# Patient Record
Sex: Female | Born: 1940 | Race: Black or African American | Hispanic: No | Marital: Married | Smoking: Never smoker
Health system: Southern US, Community
[De-identification: ages and names within clinical notes are randomized; demographics above are authoritative.]

## PROBLEM LIST (undated history)

## (undated) DIAGNOSIS — K219 Gastro-esophageal reflux disease without esophagitis: Secondary | ICD-10-CM

## (undated) DIAGNOSIS — F32A Depression, unspecified: Secondary | ICD-10-CM

## (undated) DIAGNOSIS — M48061 Spinal stenosis, lumbar region without neurogenic claudication: Secondary | ICD-10-CM

## (undated) DIAGNOSIS — K449 Diaphragmatic hernia without obstruction or gangrene: Secondary | ICD-10-CM

## (undated) DIAGNOSIS — K3184 Gastroparesis: Secondary | ICD-10-CM

## (undated) DIAGNOSIS — N183 Chronic kidney disease, stage 3 unspecified: Secondary | ICD-10-CM

## (undated) DIAGNOSIS — F329 Major depressive disorder, single episode, unspecified: Secondary | ICD-10-CM

## (undated) DIAGNOSIS — Z789 Other specified health status: Secondary | ICD-10-CM

## (undated) DIAGNOSIS — F419 Anxiety disorder, unspecified: Secondary | ICD-10-CM

## (undated) DIAGNOSIS — I10 Essential (primary) hypertension: Secondary | ICD-10-CM

## (undated) DIAGNOSIS — E785 Hyperlipidemia, unspecified: Secondary | ICD-10-CM

## (undated) DIAGNOSIS — I639 Cerebral infarction, unspecified: Secondary | ICD-10-CM

## (undated) HISTORY — DX: Cerebral infarction, unspecified: I63.9

## (undated) HISTORY — DX: Depression, unspecified: F32.A

## (undated) HISTORY — DX: Diaphragmatic hernia without obstruction or gangrene: K44.9

## (undated) HISTORY — PX: TONSILLECTOMY: SUR1361

## (undated) HISTORY — DX: Anxiety disorder, unspecified: F41.9

## (undated) HISTORY — DX: Essential (primary) hypertension: I10

## (undated) HISTORY — DX: Major depressive disorder, single episode, unspecified: F32.9

## (undated) HISTORY — DX: Hyperlipidemia, unspecified: E78.5

## (undated) HISTORY — DX: Gastro-esophageal reflux disease without esophagitis: K21.9

## (undated) HISTORY — PX: OTHER SURGICAL HISTORY: SHX169

## (undated) HISTORY — DX: Other specified health status: Z78.9

## (undated) HISTORY — DX: Gastroparesis: K31.84

---

## 1973-01-31 HISTORY — PX: BREAST BIOPSY: SHX20

## 1973-01-31 HISTORY — PX: TUBAL LIGATION: SHX77

## 1980-02-01 HISTORY — PX: ABDOMINAL HYSTERECTOMY: SHX81

## 1980-02-01 HISTORY — PX: APPENDECTOMY: SHX54

## 1999-09-15 ENCOUNTER — Encounter: Admission: RE | Admit: 1999-09-15 | Discharge: 1999-09-15 | Payer: Self-pay | Admitting: Family Medicine

## 1999-09-15 ENCOUNTER — Encounter: Payer: Self-pay | Admitting: Family Medicine

## 1999-11-04 ENCOUNTER — Inpatient Hospital Stay (HOSPITAL_COMMUNITY): Admission: EM | Admit: 1999-11-04 | Discharge: 1999-11-05 | Payer: Self-pay | Admitting: Internal Medicine

## 1999-11-05 ENCOUNTER — Encounter: Payer: Self-pay | Admitting: Internal Medicine

## 2001-06-01 ENCOUNTER — Encounter: Payer: Self-pay | Admitting: Family Medicine

## 2001-06-01 ENCOUNTER — Ambulatory Visit (HOSPITAL_COMMUNITY): Admission: RE | Admit: 2001-06-01 | Discharge: 2001-06-01 | Payer: Self-pay | Admitting: Family Medicine

## 2001-06-12 ENCOUNTER — Encounter: Payer: Self-pay | Admitting: Family Medicine

## 2001-06-12 ENCOUNTER — Ambulatory Visit (HOSPITAL_COMMUNITY): Admission: RE | Admit: 2001-06-12 | Discharge: 2001-06-12 | Payer: Self-pay | Admitting: Family Medicine

## 2001-07-04 ENCOUNTER — Encounter (INDEPENDENT_AMBULATORY_CARE_PROVIDER_SITE_OTHER): Payer: Self-pay | Admitting: Specialist

## 2001-07-04 ENCOUNTER — Encounter: Admission: RE | Admit: 2001-07-04 | Discharge: 2001-07-04 | Payer: Self-pay

## 2002-11-06 ENCOUNTER — Ambulatory Visit (HOSPITAL_COMMUNITY): Admission: RE | Admit: 2002-11-06 | Discharge: 2002-11-06 | Payer: Self-pay | Admitting: Family Medicine

## 2004-05-16 ENCOUNTER — Emergency Department (HOSPITAL_COMMUNITY): Admission: EM | Admit: 2004-05-16 | Discharge: 2004-05-16 | Payer: Self-pay | Admitting: Emergency Medicine

## 2004-10-25 ENCOUNTER — Ambulatory Visit: Payer: Self-pay | Admitting: Family Medicine

## 2004-10-28 ENCOUNTER — Ambulatory Visit: Payer: Self-pay | Admitting: Family Medicine

## 2004-11-26 ENCOUNTER — Encounter: Admission: RE | Admit: 2004-11-26 | Discharge: 2004-11-26 | Payer: Self-pay | Admitting: Family Medicine

## 2005-03-24 ENCOUNTER — Ambulatory Visit: Payer: Self-pay | Admitting: Family Medicine

## 2005-04-13 ENCOUNTER — Ambulatory Visit: Payer: Self-pay | Admitting: Family Medicine

## 2005-04-13 ENCOUNTER — Encounter: Admission: RE | Admit: 2005-04-13 | Discharge: 2005-04-13 | Payer: Self-pay | Admitting: Family Medicine

## 2005-04-22 ENCOUNTER — Encounter: Admission: RE | Admit: 2005-04-22 | Discharge: 2005-04-22 | Payer: Self-pay | Admitting: Family Medicine

## 2005-04-25 ENCOUNTER — Ambulatory Visit: Payer: Self-pay | Admitting: Family Medicine

## 2005-04-26 ENCOUNTER — Ambulatory Visit: Payer: Self-pay | Admitting: Internal Medicine

## 2005-04-26 ENCOUNTER — Inpatient Hospital Stay (HOSPITAL_COMMUNITY): Admission: EM | Admit: 2005-04-26 | Discharge: 2005-04-29 | Payer: Self-pay | Admitting: Emergency Medicine

## 2005-04-26 ENCOUNTER — Ambulatory Visit: Payer: Self-pay | Admitting: Physical Medicine & Rehabilitation

## 2005-04-27 ENCOUNTER — Ambulatory Visit: Payer: Self-pay | Admitting: Cardiology

## 2005-05-02 ENCOUNTER — Ambulatory Visit: Payer: Self-pay | Admitting: Family Medicine

## 2005-05-02 ENCOUNTER — Emergency Department (HOSPITAL_COMMUNITY): Admission: EM | Admit: 2005-05-02 | Discharge: 2005-05-03 | Payer: Self-pay | Admitting: Emergency Medicine

## 2005-05-04 ENCOUNTER — Inpatient Hospital Stay (HOSPITAL_COMMUNITY): Admission: EM | Admit: 2005-05-04 | Discharge: 2005-05-06 | Payer: Self-pay | Admitting: Emergency Medicine

## 2005-05-04 ENCOUNTER — Ambulatory Visit: Payer: Self-pay | Admitting: Physical Medicine & Rehabilitation

## 2005-05-19 ENCOUNTER — Ambulatory Visit: Payer: Self-pay | Admitting: Family Medicine

## 2005-05-29 ENCOUNTER — Ambulatory Visit (HOSPITAL_BASED_OUTPATIENT_CLINIC_OR_DEPARTMENT_OTHER): Admission: RE | Admit: 2005-05-29 | Discharge: 2005-05-29 | Payer: Self-pay | Admitting: Family Medicine

## 2005-05-31 ENCOUNTER — Encounter: Admission: RE | Admit: 2005-05-31 | Discharge: 2005-06-20 | Payer: Self-pay | Admitting: Family Medicine

## 2005-06-07 ENCOUNTER — Ambulatory Visit: Payer: Self-pay | Admitting: Pulmonary Disease

## 2005-06-20 ENCOUNTER — Ambulatory Visit: Payer: Self-pay | Admitting: Family Medicine

## 2005-06-29 ENCOUNTER — Ambulatory Visit: Payer: Self-pay | Admitting: Internal Medicine

## 2005-07-14 ENCOUNTER — Ambulatory Visit: Payer: Self-pay | Admitting: Emergency Medicine

## 2005-07-14 ENCOUNTER — Ambulatory Visit: Payer: Self-pay | Admitting: Family Medicine

## 2005-07-28 ENCOUNTER — Ambulatory Visit: Payer: Self-pay | Admitting: Internal Medicine

## 2005-08-09 ENCOUNTER — Ambulatory Visit: Payer: Self-pay | Admitting: Family Medicine

## 2005-08-15 ENCOUNTER — Ambulatory Visit: Payer: Self-pay | Admitting: Emergency Medicine

## 2005-08-15 ENCOUNTER — Ambulatory Visit: Payer: Self-pay | Admitting: Cardiology

## 2005-08-17 ENCOUNTER — Ambulatory Visit (HOSPITAL_COMMUNITY): Admission: RE | Admit: 2005-08-17 | Discharge: 2005-08-17 | Payer: Self-pay | Admitting: Emergency Medicine

## 2005-08-30 ENCOUNTER — Ambulatory Visit: Payer: Self-pay | Admitting: Family Medicine

## 2005-08-31 ENCOUNTER — Ambulatory Visit: Payer: Self-pay | Admitting: Emergency Medicine

## 2005-09-14 ENCOUNTER — Ambulatory Visit: Payer: Self-pay | Admitting: Family Medicine

## 2005-09-20 ENCOUNTER — Ambulatory Visit: Payer: Self-pay | Admitting: Family Medicine

## 2005-10-06 ENCOUNTER — Ambulatory Visit: Payer: Self-pay | Admitting: Family Medicine

## 2005-10-18 ENCOUNTER — Ambulatory Visit: Payer: Self-pay

## 2005-10-18 ENCOUNTER — Encounter: Payer: Self-pay | Admitting: Cardiology

## 2005-11-30 ENCOUNTER — Encounter: Admission: RE | Admit: 2005-11-30 | Discharge: 2005-11-30 | Payer: Self-pay | Admitting: Family Medicine

## 2005-12-08 ENCOUNTER — Encounter: Admission: RE | Admit: 2005-12-08 | Discharge: 2005-12-08 | Payer: Self-pay | Admitting: Family Medicine

## 2005-12-08 ENCOUNTER — Ambulatory Visit: Payer: Self-pay | Admitting: Family Medicine

## 2005-12-12 ENCOUNTER — Ambulatory Visit: Payer: Self-pay | Admitting: Family Medicine

## 2005-12-12 LAB — CONVERTED CEMR LAB
Albumin: 3.8 g/dL (ref 3.5–5.2)
CO2: 28 meq/L (ref 19–32)
Creatinine, Ser: 1.1 mg/dL (ref 0.4–1.2)
Hgb A1c MFr Bld: 5.8 % (ref 4.6–6.0)
Microalb Creat Ratio: 11.6 mg/g (ref 0.0–30.0)
Sodium: 140 meq/L (ref 135–145)
Total Bilirubin: 1 mg/dL (ref 0.3–1.2)

## 2006-01-03 ENCOUNTER — Ambulatory Visit: Payer: Self-pay | Admitting: Family Medicine

## 2006-01-16 ENCOUNTER — Ambulatory Visit: Payer: Self-pay | Admitting: Gastroenterology

## 2006-01-26 ENCOUNTER — Ambulatory Visit: Payer: Self-pay | Admitting: Gastroenterology

## 2006-01-26 DIAGNOSIS — K648 Other hemorrhoids: Secondary | ICD-10-CM | POA: Insufficient documentation

## 2006-02-01 ENCOUNTER — Ambulatory Visit: Payer: Self-pay | Admitting: Family Medicine

## 2006-02-09 DIAGNOSIS — E785 Hyperlipidemia, unspecified: Secondary | ICD-10-CM | POA: Insufficient documentation

## 2006-02-09 DIAGNOSIS — I6350 Cerebral infarction due to unspecified occlusion or stenosis of unspecified cerebral artery: Secondary | ICD-10-CM | POA: Insufficient documentation

## 2006-02-09 DIAGNOSIS — F068 Other specified mental disorders due to known physiological condition: Secondary | ICD-10-CM | POA: Insufficient documentation

## 2006-02-09 DIAGNOSIS — I10 Essential (primary) hypertension: Secondary | ICD-10-CM | POA: Insufficient documentation

## 2006-02-09 DIAGNOSIS — E119 Type 2 diabetes mellitus without complications: Secondary | ICD-10-CM | POA: Insufficient documentation

## 2006-04-05 ENCOUNTER — Ambulatory Visit: Payer: Self-pay | Admitting: Family Medicine

## 2006-04-05 LAB — CONVERTED CEMR LAB
Calcium: 8.9 mg/dL (ref 8.4–10.5)
Chloride: 107 meq/L (ref 96–112)
GFR calc non Af Amer: 67 mL/min
Sodium: 142 meq/L (ref 135–145)

## 2006-05-08 ENCOUNTER — Ambulatory Visit: Payer: Self-pay | Admitting: Family Medicine

## 2006-06-05 ENCOUNTER — Ambulatory Visit: Payer: Self-pay | Admitting: Family Medicine

## 2006-06-05 LAB — CONVERTED CEMR LAB
ALT: 16 units/L (ref 0–40)
AST: 16 units/L (ref 0–37)
Albumin: 3.8 g/dL (ref 3.5–5.2)
Amylase: 75 units/L (ref 27–131)
Creatinine,U: 376.5 mg/dL
Hgb A1c MFr Bld: 6.6 % — ABNORMAL HIGH (ref 4.6–6.0)

## 2006-08-10 ENCOUNTER — Ambulatory Visit: Payer: Self-pay | Admitting: Family Medicine

## 2006-08-10 DIAGNOSIS — F411 Generalized anxiety disorder: Secondary | ICD-10-CM | POA: Insufficient documentation

## 2006-08-21 ENCOUNTER — Telehealth (INDEPENDENT_AMBULATORY_CARE_PROVIDER_SITE_OTHER): Payer: Self-pay | Admitting: *Deleted

## 2006-09-07 ENCOUNTER — Ambulatory Visit: Payer: Self-pay | Admitting: Family Medicine

## 2006-09-07 DIAGNOSIS — D239 Other benign neoplasm of skin, unspecified: Secondary | ICD-10-CM | POA: Insufficient documentation

## 2006-09-11 ENCOUNTER — Encounter: Payer: Self-pay | Admitting: Family Medicine

## 2006-09-12 ENCOUNTER — Encounter: Payer: Self-pay | Admitting: Family Medicine

## 2006-09-25 ENCOUNTER — Ambulatory Visit: Payer: Self-pay | Admitting: Family Medicine

## 2006-10-11 ENCOUNTER — Telehealth: Payer: Self-pay | Admitting: Family Medicine

## 2006-10-24 ENCOUNTER — Ambulatory Visit: Payer: Self-pay | Admitting: Gastroenterology

## 2006-10-27 ENCOUNTER — Encounter: Payer: Self-pay | Admitting: Family Medicine

## 2006-10-27 ENCOUNTER — Ambulatory Visit: Payer: Self-pay | Admitting: Gastroenterology

## 2006-10-27 DIAGNOSIS — K219 Gastro-esophageal reflux disease without esophagitis: Secondary | ICD-10-CM | POA: Insufficient documentation

## 2006-10-27 DIAGNOSIS — K449 Diaphragmatic hernia without obstruction or gangrene: Secondary | ICD-10-CM | POA: Insufficient documentation

## 2006-11-02 ENCOUNTER — Ambulatory Visit (HOSPITAL_COMMUNITY): Admission: RE | Admit: 2006-11-02 | Discharge: 2006-11-02 | Payer: Self-pay | Admitting: Gastroenterology

## 2006-11-13 ENCOUNTER — Ambulatory Visit: Payer: Self-pay | Admitting: Internal Medicine

## 2006-11-13 ENCOUNTER — Inpatient Hospital Stay (HOSPITAL_COMMUNITY): Admission: EM | Admit: 2006-11-13 | Discharge: 2006-11-15 | Payer: Self-pay | Admitting: Emergency Medicine

## 2006-11-13 ENCOUNTER — Telehealth (INDEPENDENT_AMBULATORY_CARE_PROVIDER_SITE_OTHER): Payer: Self-pay | Admitting: *Deleted

## 2006-11-13 ENCOUNTER — Ambulatory Visit: Payer: Self-pay | Admitting: Cardiovascular Disease

## 2006-11-14 ENCOUNTER — Encounter: Payer: Self-pay | Admitting: Internal Medicine

## 2006-11-28 IMAGING — CT CT HEAD W/O CM
1 of 2 series · 13 of 30 positions shown, 17 images · IV contrast (agent unspecified)
Comparison: 04/27/05.

CLINICAL DATA: Leg weakness.  History of recent right ACA infarct.
HEAD CT WITHOUT CONTRAST:
TECHNIQUE: Contiguous axial images were obtained from the base of the skull through the vertex according to standard protocol without contrast.

[Series 2: brain · axial · 0.47mm/px · z∈[+123,+250]mm · 13 of 32 slices shown, 17 images]
[im 3/32  brain]
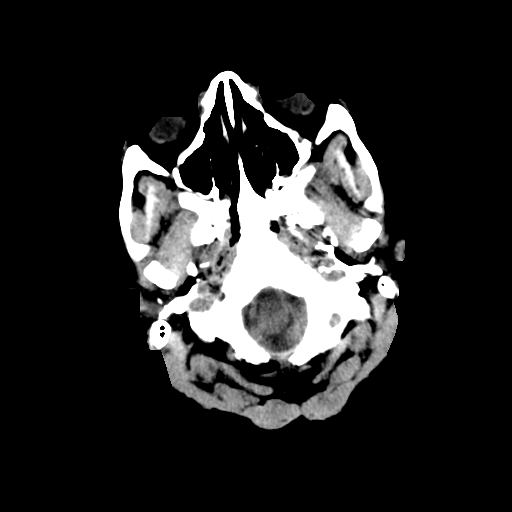
[im 3/32  bone]
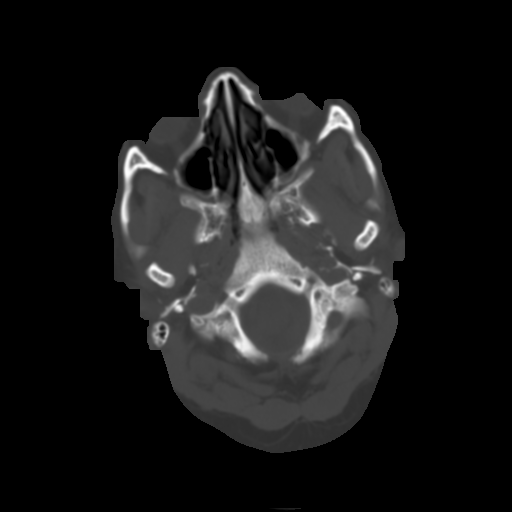
[im 5/32  brain]
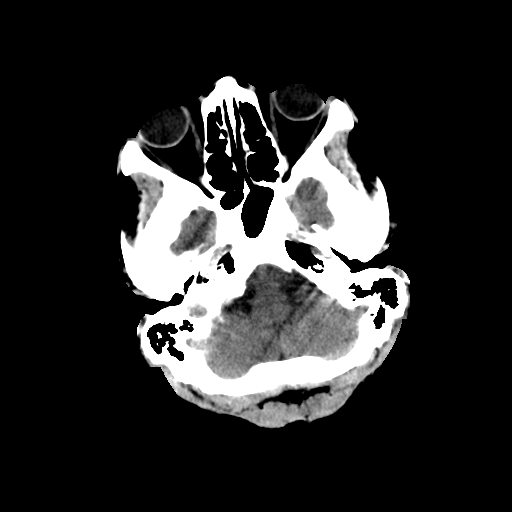
[im 7/32  brain]
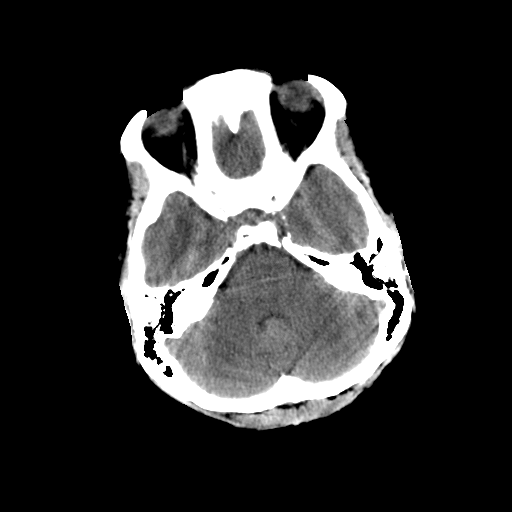
[im 9/32  brain]
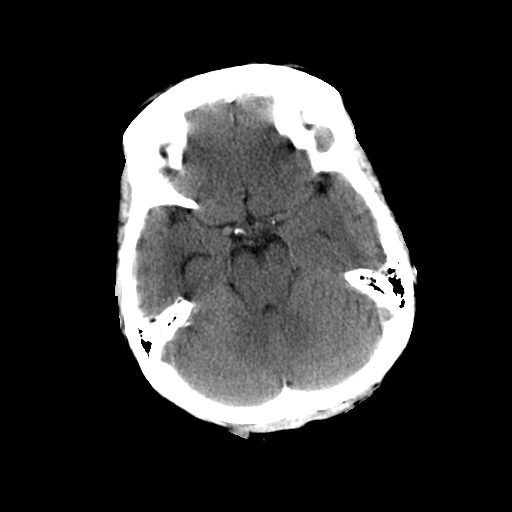
[im 12/32  brain]
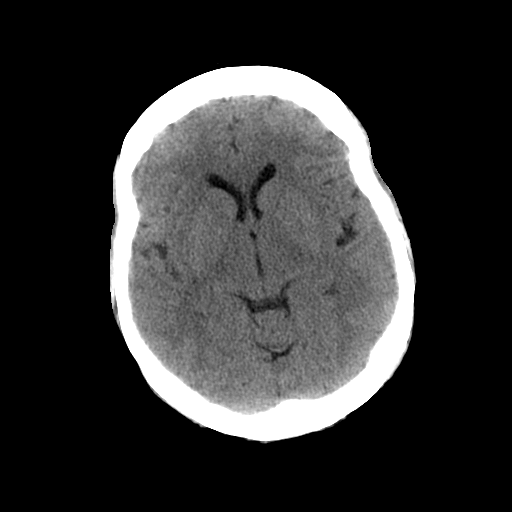
[im 12/32  bone]
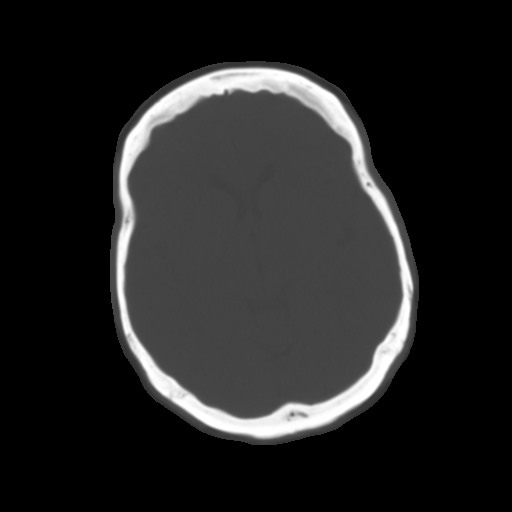
[im 14/32  brain]
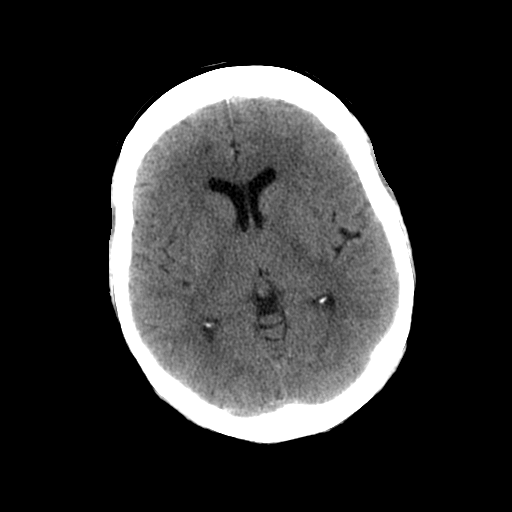
[im 16/32  brain]
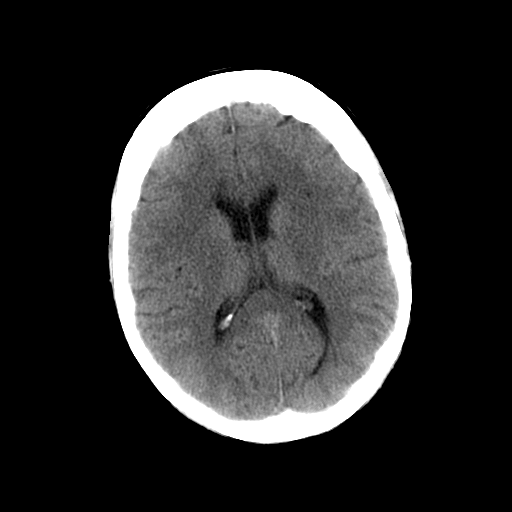
[im 18/32  brain]
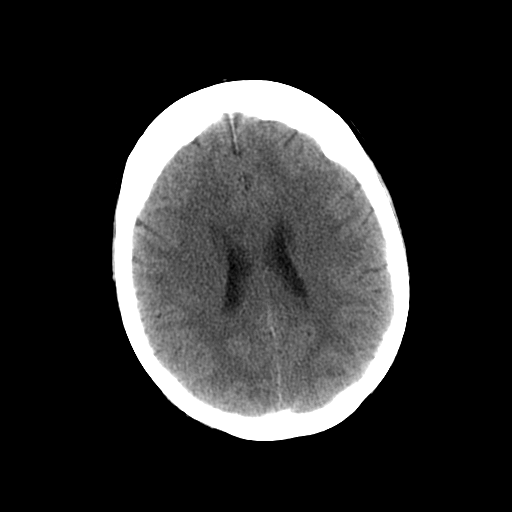
[im 20/32  brain]
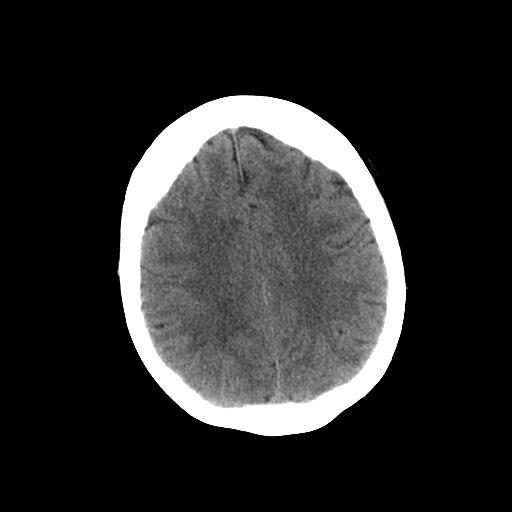
[im 20/32  bone]
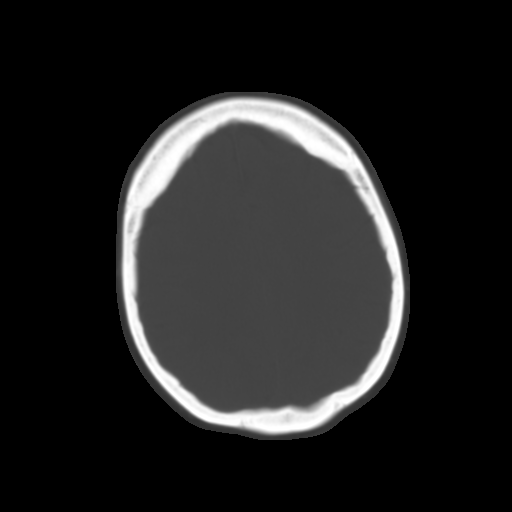
[im 23/32  brain]
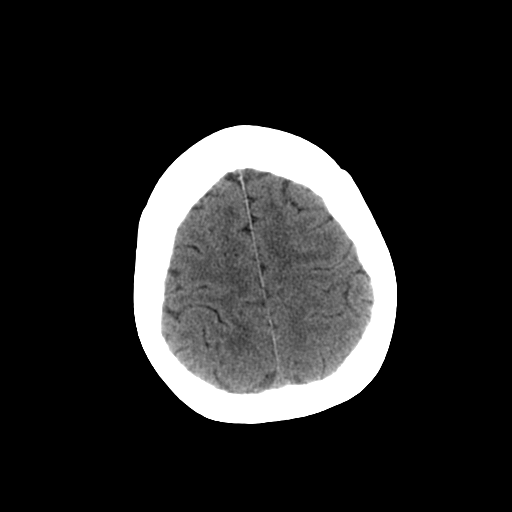
[im 25/32  brain]
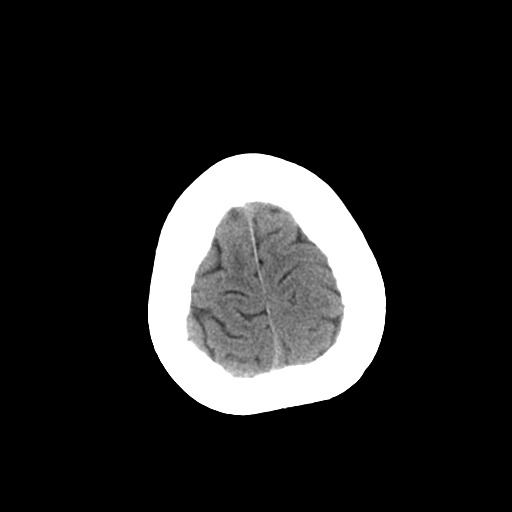
[im 27/32  brain]
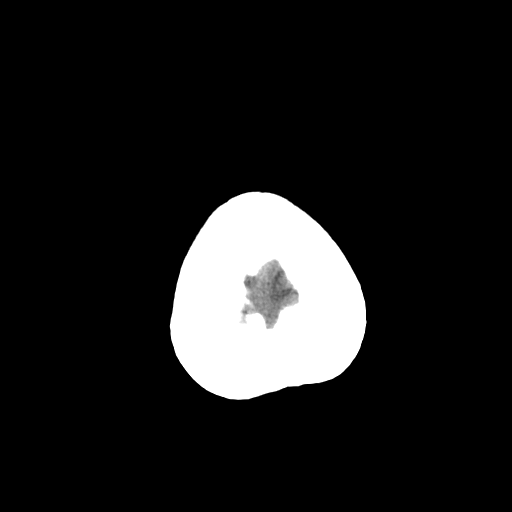
[im 29/32  brain]
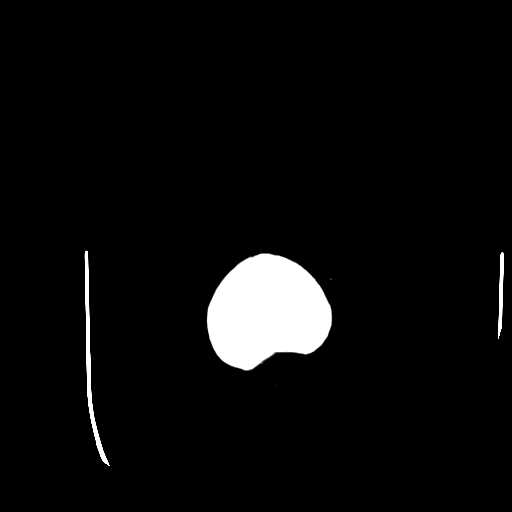
[im 29/32  bone]
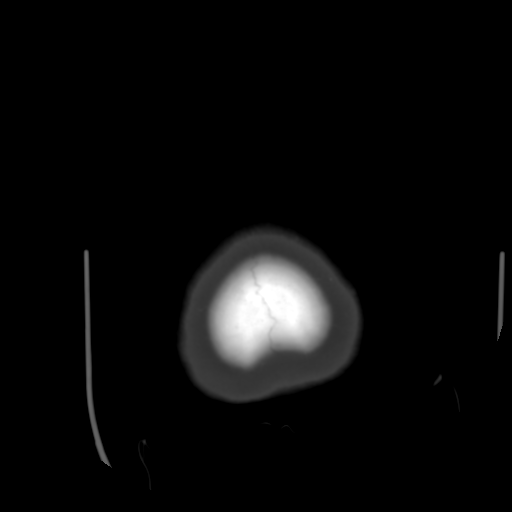

[13 of 30 positions shown; findings below may reference images not displayed]

FINDINGS: Stable white matter hypodensities are again noted.  Right ACA infarct is again vaguely visualized on this study.  No new infarct or abnormalities are identified.  There is no evidence of hemorrhage, mass effect, hydrocephalus, midline shift, or extraaxial fluid collection.  Remote left cerebellar lacune is again noted.
IMPRESSION: 1.  No evidence of acute intracranial abnormality. 
2.  Stable white matter disease and right ACA infarct.

## 2006-11-29 DIAGNOSIS — G4700 Insomnia, unspecified: Secondary | ICD-10-CM | POA: Insufficient documentation

## 2006-11-29 IMAGING — CT CT HEAD W/O CM
1 of 2 series · 13 of 30 positions shown, 17 images · IV contrast (agent unspecified)
Comparison: 05/03/05

CLINICAL DATA: Weakness and CVA in March 2005.  Increasing left-sided weakness and frontal headache.  
HEAD CT WITHOUT CONTRAST:
TECHNIQUE: Contiguous axial images were obtained from the base of the skull through the vertex according to standard protocol without contrast.

[Series 2: brain · axial · 0.47mm/px · z∈[+141,+257]mm · 13 of 28 slices shown, 17 images]
[im 2/28  brain]
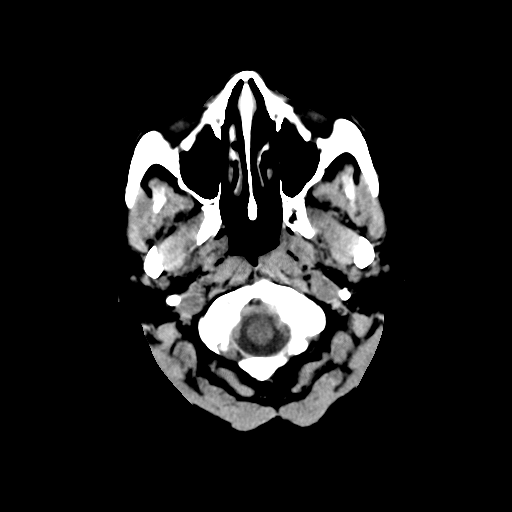
[im 2/28  bone]
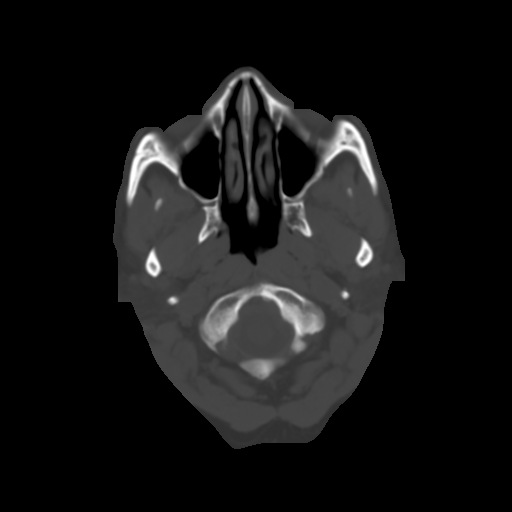
[im 4/28  brain]
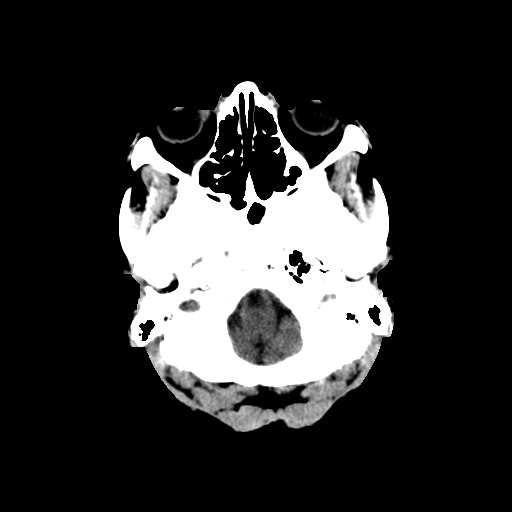
[im 6/28  brain]
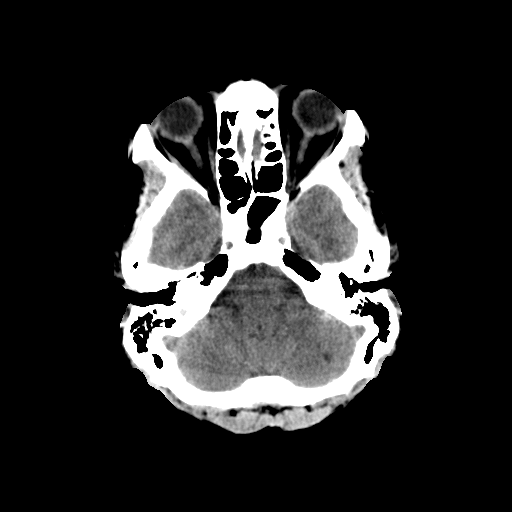
[im 8/28  brain]
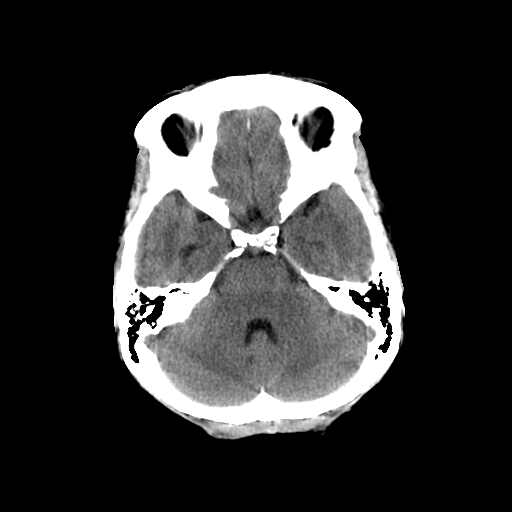
[im 10/28  brain]
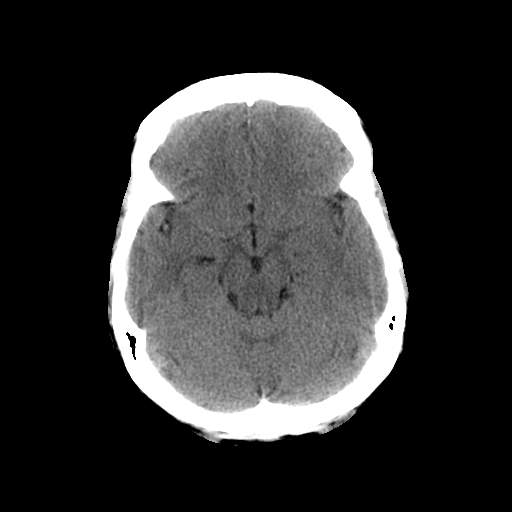
[im 10/28  bone]
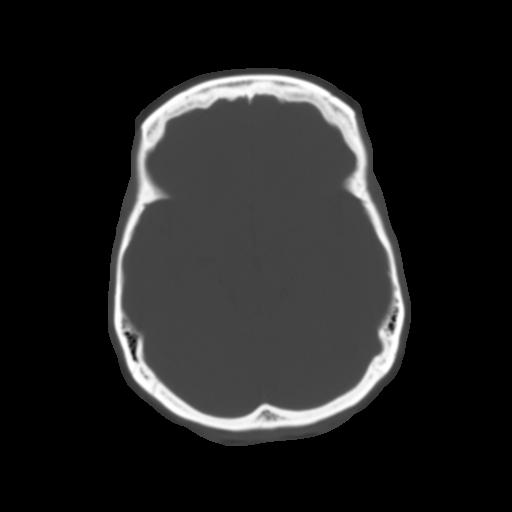
[im 12/28  brain]
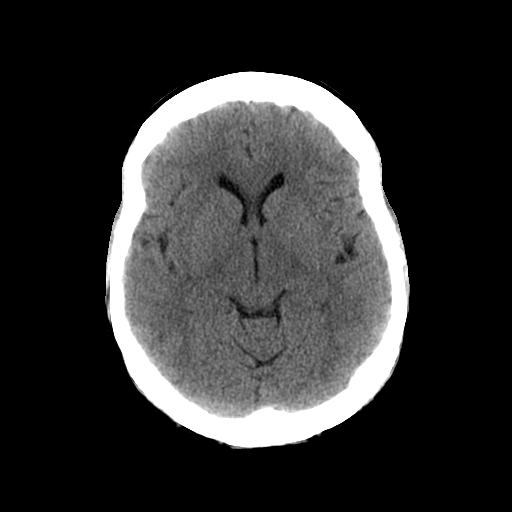
[im 14/28  brain]
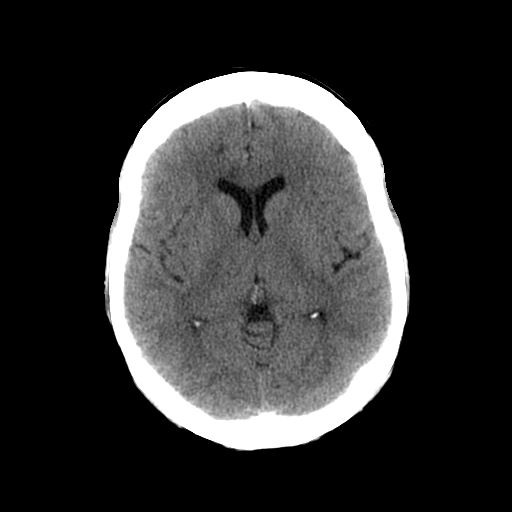
[im 16/28  brain]
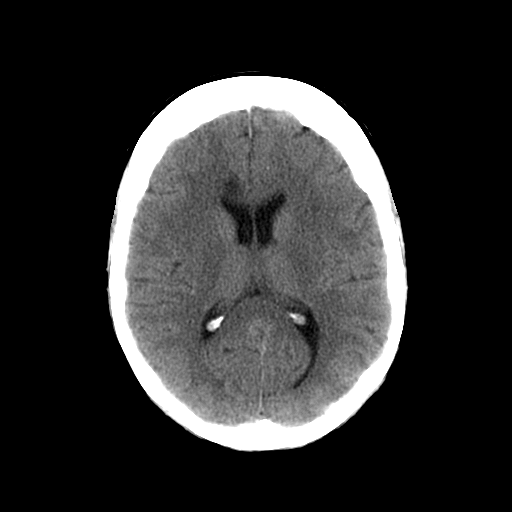
[im 18/28  brain]
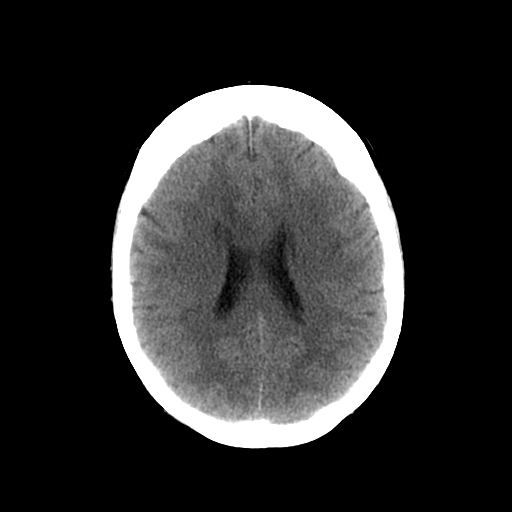
[im 18/28  bone]
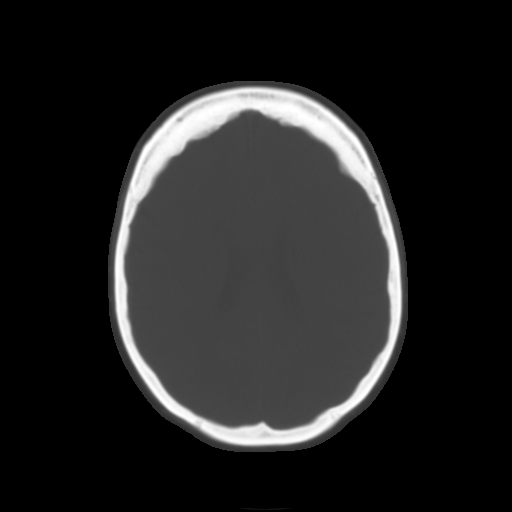
[im 20/28  brain]
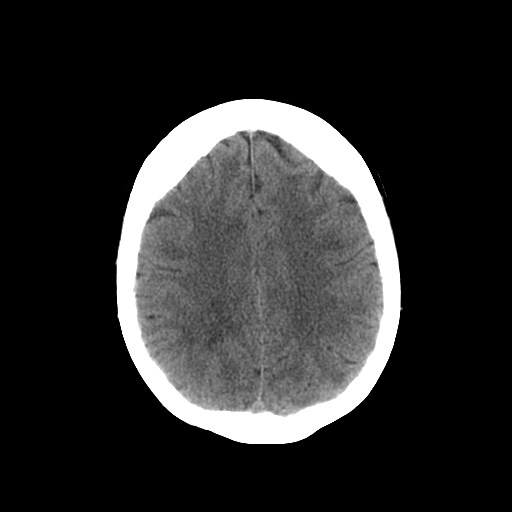
[im 22/28  brain]
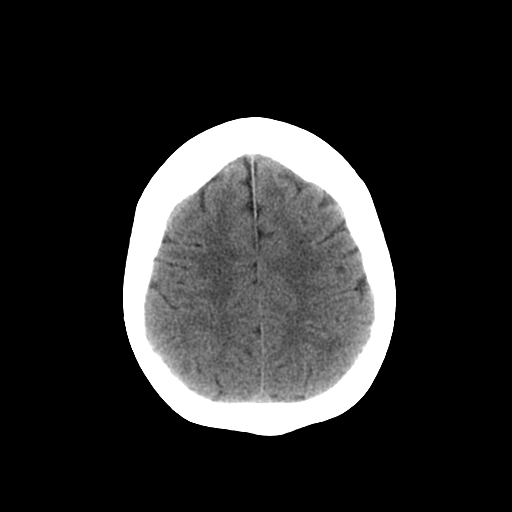
[im 24/28  brain]
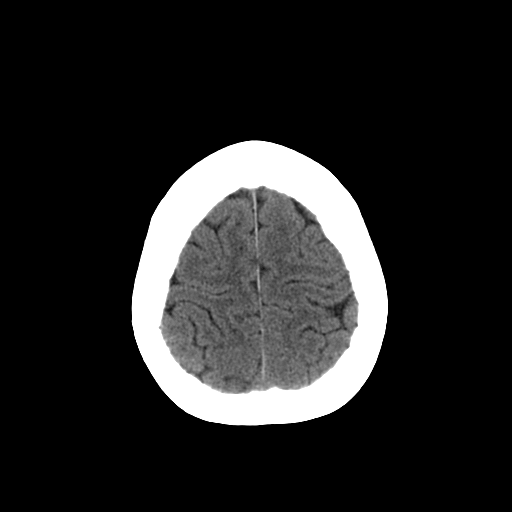
[im 26/28  brain]
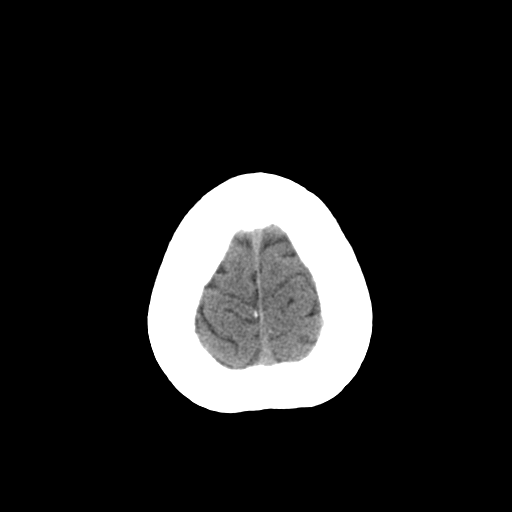
[im 26/28  bone]
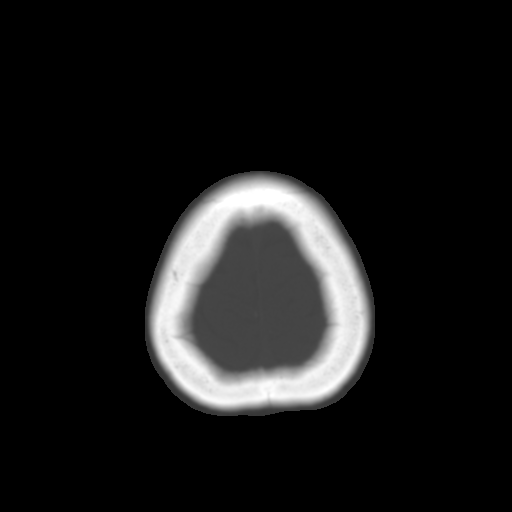

[13 of 30 positions shown; findings below may reference images not displayed]

FINDINGS: There is a new area of low attenuation adjacent to the frontal horn of the right lateral ventricle.  No evidence of hemorrhage, mass, mass effect, or hydrocephalus.  Visualized paranasal sinuses and mastoid air cells are clear.
IMPRESSION: 1.  New focus of decreased attenuation in the periventricular deep white matter of the right frontal lobe, worrisome for acute or subacute infarct.  MRI would be useful in further evaluation, as clinically indicated.

## 2006-12-06 ENCOUNTER — Ambulatory Visit: Payer: Self-pay | Admitting: Family Medicine

## 2006-12-06 DIAGNOSIS — J018 Other acute sinusitis: Secondary | ICD-10-CM | POA: Insufficient documentation

## 2006-12-07 ENCOUNTER — Encounter: Admission: RE | Admit: 2006-12-07 | Discharge: 2006-12-07 | Payer: Self-pay | Admitting: Family Medicine

## 2006-12-07 LAB — CONVERTED CEMR LAB
BUN: 11 mg/dL (ref 6–23)
CO2: 30 meq/L (ref 19–32)
Calcium: 9 mg/dL (ref 8.4–10.5)
Creatinine, Ser: 0.9 mg/dL (ref 0.4–1.2)
Hgb A1c MFr Bld: 5.5 % (ref 4.6–6.0)

## 2006-12-11 ENCOUNTER — Encounter (INDEPENDENT_AMBULATORY_CARE_PROVIDER_SITE_OTHER): Payer: Self-pay | Admitting: *Deleted

## 2006-12-19 ENCOUNTER — Telehealth (INDEPENDENT_AMBULATORY_CARE_PROVIDER_SITE_OTHER): Payer: Self-pay | Admitting: *Deleted

## 2006-12-20 ENCOUNTER — Ambulatory Visit: Payer: Self-pay | Admitting: Family Medicine

## 2006-12-20 DIAGNOSIS — F449 Dissociative and conversion disorder, unspecified: Secondary | ICD-10-CM | POA: Insufficient documentation

## 2006-12-20 DIAGNOSIS — R42 Dizziness and giddiness: Secondary | ICD-10-CM | POA: Insufficient documentation

## 2006-12-25 ENCOUNTER — Encounter: Payer: Self-pay | Admitting: Family Medicine

## 2007-02-06 ENCOUNTER — Telehealth (INDEPENDENT_AMBULATORY_CARE_PROVIDER_SITE_OTHER): Payer: Self-pay | Admitting: *Deleted

## 2007-03-05 ENCOUNTER — Ambulatory Visit: Payer: Self-pay | Admitting: Family Medicine

## 2007-03-05 LAB — CONVERTED CEMR LAB
ALT: 15 units/L (ref 0–35)
Albumin: 3.5 g/dL (ref 3.5–5.2)
Alkaline Phosphatase: 55 units/L (ref 39–117)
BUN: 13 mg/dL (ref 6–23)
CO2: 31 meq/L (ref 19–32)
Calcium: 8.8 mg/dL (ref 8.4–10.5)
Creatinine, Ser: 1 mg/dL (ref 0.4–1.2)
GFR calc Af Amer: 71 mL/min
Total Bilirubin: 1 mg/dL (ref 0.3–1.2)
Total Protein: 6.7 g/dL (ref 6.0–8.3)

## 2007-03-09 ENCOUNTER — Encounter: Payer: Self-pay | Admitting: Family Medicine

## 2007-05-02 ENCOUNTER — Ambulatory Visit: Payer: Self-pay | Admitting: Family Medicine

## 2007-05-02 DIAGNOSIS — D649 Anemia, unspecified: Secondary | ICD-10-CM | POA: Insufficient documentation

## 2007-05-09 ENCOUNTER — Telehealth (INDEPENDENT_AMBULATORY_CARE_PROVIDER_SITE_OTHER): Payer: Self-pay | Admitting: *Deleted

## 2007-05-12 LAB — CONVERTED CEMR LAB
BUN: 8 mg/dL (ref 6–23)
Basophils Absolute: 0.1 10*3/uL (ref 0.0–0.1)
Basophils Relative: 0.8 % (ref 0.0–1.0)
Eosinophils Absolute: 0.1 10*3/uL (ref 0.0–0.7)
Eosinophils Relative: 1.9 % (ref 0.0–5.0)
Ferritin: 29.6 ng/mL (ref 10.0–291.0)
HCT: 35.8 % — ABNORMAL LOW (ref 36.0–46.0)
Iron: 81 ug/dL (ref 42–145)
MCHC: 31 g/dL (ref 30.0–36.0)
MCV: 92.9 fL (ref 78.0–100.0)
Monocytes Absolute: 0.5 10*3/uL (ref 0.1–1.0)
Neutro Abs: 4.9 10*3/uL (ref 1.4–7.7)
Neutrophils Relative %: 61.9 % (ref 43.0–77.0)
RBC: 3.86 M/uL — ABNORMAL LOW (ref 3.87–5.11)
WBC: 7.8 10*3/uL (ref 4.5–10.5)

## 2007-05-13 ENCOUNTER — Encounter (INDEPENDENT_AMBULATORY_CARE_PROVIDER_SITE_OTHER): Payer: Self-pay | Admitting: *Deleted

## 2007-06-13 ENCOUNTER — Telehealth (INDEPENDENT_AMBULATORY_CARE_PROVIDER_SITE_OTHER): Payer: Self-pay | Admitting: *Deleted

## 2007-06-13 ENCOUNTER — Ambulatory Visit: Payer: Self-pay | Admitting: Internal Medicine

## 2007-06-13 DIAGNOSIS — IMO0002 Reserved for concepts with insufficient information to code with codable children: Secondary | ICD-10-CM | POA: Insufficient documentation

## 2007-06-13 DIAGNOSIS — M48061 Spinal stenosis, lumbar region without neurogenic claudication: Secondary | ICD-10-CM | POA: Insufficient documentation

## 2007-06-18 ENCOUNTER — Encounter: Payer: Self-pay | Admitting: Internal Medicine

## 2007-06-18 ENCOUNTER — Telehealth: Payer: Self-pay | Admitting: Internal Medicine

## 2007-06-18 ENCOUNTER — Telehealth (INDEPENDENT_AMBULATORY_CARE_PROVIDER_SITE_OTHER): Payer: Self-pay | Admitting: *Deleted

## 2007-07-06 ENCOUNTER — Telehealth (INDEPENDENT_AMBULATORY_CARE_PROVIDER_SITE_OTHER): Payer: Self-pay | Admitting: *Deleted

## 2007-07-11 ENCOUNTER — Ambulatory Visit: Payer: Self-pay | Admitting: Family Medicine

## 2007-07-11 DIAGNOSIS — N390 Urinary tract infection, site not specified: Secondary | ICD-10-CM | POA: Insufficient documentation

## 2007-07-11 LAB — CONVERTED CEMR LAB
Bilirubin Urine: NEGATIVE
Glucose, Urine, Semiquant: NEGATIVE
Nitrite: NEGATIVE
Specific Gravity, Urine: <1.005
pH: 8

## 2007-07-16 ENCOUNTER — Encounter (INDEPENDENT_AMBULATORY_CARE_PROVIDER_SITE_OTHER): Payer: Self-pay | Admitting: *Deleted

## 2007-07-17 ENCOUNTER — Ambulatory Visit: Payer: Self-pay | Admitting: Family Medicine

## 2007-07-18 LAB — CONVERTED CEMR LAB
Albumin: 3.9 g/dL (ref 3.5–5.2)
BUN: 10 mg/dL (ref 6–23)
Bilirubin, Direct: 0.1 mg/dL (ref 0.0–0.3)
Calcium: 8.8 mg/dL (ref 8.4–10.5)
Cholesterol: 118 mg/dL (ref 0–200)
Eosinophils Absolute: 0.1 10*3/uL (ref 0.0–0.7)
GFR calc Af Amer: 71 mL/min
GFR calc non Af Amer: 59 mL/min
Glucose, Bld: 108 mg/dL — ABNORMAL HIGH (ref 70–99)
HCT: 34 % — ABNORMAL LOW (ref 36.0–46.0)
HDL: 44.3 mg/dL (ref >39.0)
Hemoglobin: 11.2 g/dL — ABNORMAL LOW (ref 12.0–15.0)
Hgb A1c MFr Bld: 5.7 % (ref 4.6–6.0)
MCV: 94.3 fL (ref 78.0–100.0)
Microalb Creat Ratio: 2.9 mg/g (ref 0.0–30.0)
Microalb, Ur: 0.5 mg/dL (ref 0.0–1.9)
Monocytes Absolute: 0.4 10*3/uL (ref 0.1–1.0)
Monocytes Relative: 6.1 % (ref 3.0–12.0)
Neutro Abs: 4.3 10*3/uL (ref 1.4–7.7)
Platelets: 227 10*3/uL (ref 150–400)
RDW: 13.5 % (ref 11.5–14.6)
Sodium: 142 meq/L (ref 135–145)
Total CHOL/HDL Ratio: 2.7
Total Protein: 7.1 g/dL (ref 6.0–8.3)
Triglycerides: 83 mg/dL (ref 0–149)

## 2007-07-19 ENCOUNTER — Encounter (INDEPENDENT_AMBULATORY_CARE_PROVIDER_SITE_OTHER): Payer: Self-pay | Admitting: *Deleted

## 2007-08-01 ENCOUNTER — Ambulatory Visit: Payer: Self-pay | Admitting: Family Medicine

## 2007-08-06 ENCOUNTER — Telehealth (INDEPENDENT_AMBULATORY_CARE_PROVIDER_SITE_OTHER): Payer: Self-pay | Admitting: *Deleted

## 2007-08-21 ENCOUNTER — Encounter: Payer: Self-pay | Admitting: Family Medicine

## 2007-08-24 ENCOUNTER — Ambulatory Visit: Payer: Self-pay | Admitting: Family Medicine

## 2007-08-24 ENCOUNTER — Ambulatory Visit (HOSPITAL_COMMUNITY): Admission: RE | Admit: 2007-08-24 | Discharge: 2007-08-24 | Payer: Self-pay | Admitting: Family Medicine

## 2007-08-24 DIAGNOSIS — N760 Acute vaginitis: Secondary | ICD-10-CM | POA: Insufficient documentation

## 2007-08-27 ENCOUNTER — Encounter (INDEPENDENT_AMBULATORY_CARE_PROVIDER_SITE_OTHER): Payer: Self-pay | Admitting: *Deleted

## 2007-08-28 ENCOUNTER — Telehealth (INDEPENDENT_AMBULATORY_CARE_PROVIDER_SITE_OTHER): Payer: Self-pay | Admitting: *Deleted

## 2007-09-05 ENCOUNTER — Encounter: Payer: Self-pay | Admitting: Family Medicine

## 2007-09-06 ENCOUNTER — Ambulatory Visit: Payer: Self-pay | Admitting: Family Medicine

## 2007-09-13 ENCOUNTER — Telehealth (INDEPENDENT_AMBULATORY_CARE_PROVIDER_SITE_OTHER): Payer: Self-pay | Admitting: *Deleted

## 2007-09-19 ENCOUNTER — Inpatient Hospital Stay (HOSPITAL_COMMUNITY): Admission: AD | Admit: 2007-09-19 | Discharge: 2007-09-20 | Payer: Self-pay | Admitting: Neurosurgery

## 2007-09-28 ENCOUNTER — Ambulatory Visit: Payer: Self-pay | Admitting: Family Medicine

## 2007-10-03 ENCOUNTER — Encounter: Payer: Self-pay | Admitting: Internal Medicine

## 2007-10-11 ENCOUNTER — Encounter: Payer: Self-pay | Admitting: Family Medicine

## 2007-10-24 ENCOUNTER — Telehealth (INDEPENDENT_AMBULATORY_CARE_PROVIDER_SITE_OTHER): Payer: Self-pay | Admitting: *Deleted

## 2007-10-25 ENCOUNTER — Encounter: Payer: Self-pay | Admitting: Family Medicine

## 2007-11-19 ENCOUNTER — Ambulatory Visit: Payer: Self-pay | Admitting: Family Medicine

## 2007-11-20 ENCOUNTER — Encounter: Payer: Self-pay | Admitting: Family Medicine

## 2007-12-05 ENCOUNTER — Encounter: Payer: Self-pay | Admitting: Family Medicine

## 2007-12-10 ENCOUNTER — Encounter: Admission: RE | Admit: 2007-12-10 | Discharge: 2007-12-10 | Payer: Self-pay | Admitting: Family Medicine

## 2007-12-13 ENCOUNTER — Encounter: Admission: RE | Admit: 2007-12-13 | Discharge: 2007-12-13 | Payer: Self-pay | Admitting: Neurosurgery

## 2007-12-13 ENCOUNTER — Telehealth (INDEPENDENT_AMBULATORY_CARE_PROVIDER_SITE_OTHER): Payer: Self-pay | Admitting: *Deleted

## 2007-12-18 ENCOUNTER — Encounter: Payer: Self-pay | Admitting: Family Medicine

## 2007-12-26 ENCOUNTER — Encounter: Payer: Self-pay | Admitting: Family Medicine

## 2008-01-08 ENCOUNTER — Ambulatory Visit: Payer: Self-pay | Admitting: Family Medicine

## 2008-01-08 DIAGNOSIS — M25559 Pain in unspecified hip: Secondary | ICD-10-CM | POA: Insufficient documentation

## 2008-01-09 ENCOUNTER — Telehealth (INDEPENDENT_AMBULATORY_CARE_PROVIDER_SITE_OTHER): Payer: Self-pay | Admitting: *Deleted

## 2008-01-09 ENCOUNTER — Ambulatory Visit: Payer: Self-pay | Admitting: Family Medicine

## 2008-01-10 ENCOUNTER — Telehealth (INDEPENDENT_AMBULATORY_CARE_PROVIDER_SITE_OTHER): Payer: Self-pay | Admitting: *Deleted

## 2008-01-18 ENCOUNTER — Encounter: Payer: Self-pay | Admitting: Family Medicine

## 2008-01-23 ENCOUNTER — Ambulatory Visit: Payer: Self-pay | Admitting: Family Medicine

## 2008-01-24 LAB — CONVERTED CEMR LAB
AST: 18 units/L (ref 0–37)
Albumin: 3.7 g/dL (ref 3.5–5.2)
BUN: 9 mg/dL (ref 6–23)
Calcium: 9 mg/dL (ref 8.4–10.5)
Cholesterol: 121 mg/dL (ref 0–200)
Creatinine, Ser: 0.8 mg/dL (ref 0.4–1.2)
GFR calc Af Amer: 92 mL/min
GFR calc non Af Amer: 76 mL/min
Hgb A1c MFr Bld: 5.9 % (ref 4.6–6.0)
LDL Cholesterol: 57 mg/dL (ref 0–99)
Triglycerides: 90 mg/dL (ref 0–149)
VLDL: 18 mg/dL (ref 0–40)

## 2008-01-28 ENCOUNTER — Encounter (INDEPENDENT_AMBULATORY_CARE_PROVIDER_SITE_OTHER): Payer: Self-pay | Admitting: *Deleted

## 2008-02-28 ENCOUNTER — Telehealth (INDEPENDENT_AMBULATORY_CARE_PROVIDER_SITE_OTHER): Payer: Self-pay | Admitting: *Deleted

## 2008-02-28 ENCOUNTER — Encounter: Payer: Self-pay | Admitting: Family Medicine

## 2008-04-01 ENCOUNTER — Encounter: Payer: Self-pay | Admitting: Family Medicine

## 2008-04-07 ENCOUNTER — Ambulatory Visit: Payer: Self-pay | Admitting: Family Medicine

## 2008-04-07 DIAGNOSIS — IMO0001 Reserved for inherently not codable concepts without codable children: Secondary | ICD-10-CM | POA: Insufficient documentation

## 2008-04-09 ENCOUNTER — Telehealth: Payer: Self-pay | Admitting: Family Medicine

## 2008-04-17 ENCOUNTER — Telehealth (INDEPENDENT_AMBULATORY_CARE_PROVIDER_SITE_OTHER): Payer: Self-pay | Admitting: *Deleted

## 2008-04-17 ENCOUNTER — Ambulatory Visit: Payer: Self-pay | Admitting: Family Medicine

## 2008-04-20 LAB — CONVERTED CEMR LAB
Chloride: 108 meq/L (ref 96–112)
Creatinine, Ser: 0.9 mg/dL (ref 0.4–1.2)
Sodium: 144 meq/L (ref 135–145)

## 2008-04-21 ENCOUNTER — Encounter (INDEPENDENT_AMBULATORY_CARE_PROVIDER_SITE_OTHER): Payer: Self-pay | Admitting: *Deleted

## 2008-04-22 ENCOUNTER — Ambulatory Visit: Payer: Self-pay | Admitting: Family Medicine

## 2008-04-22 DIAGNOSIS — E876 Hypokalemia: Secondary | ICD-10-CM | POA: Insufficient documentation

## 2008-04-22 LAB — CONVERTED CEMR LAB
Chloride: 103 meq/L (ref 96–112)
Creatinine, Ser: 0.9 mg/dL (ref 0.4–1.2)
GFR calc non Af Amer: 66 mL/min
Potassium: 3.3 meq/L — ABNORMAL LOW (ref 3.5–5.1)
Sodium: 141 meq/L (ref 135–145)
Total CK: 184 units/L — ABNORMAL HIGH (ref 7–177)

## 2008-05-06 ENCOUNTER — Ambulatory Visit: Payer: Self-pay

## 2008-05-06 ENCOUNTER — Ambulatory Visit: Payer: Self-pay | Admitting: Family Medicine

## 2008-05-06 DIAGNOSIS — M79609 Pain in unspecified limb: Secondary | ICD-10-CM | POA: Insufficient documentation

## 2008-05-23 ENCOUNTER — Telehealth: Payer: Self-pay | Admitting: Family Medicine

## 2008-05-23 ENCOUNTER — Encounter: Payer: Self-pay | Admitting: Family Medicine

## 2008-05-26 ENCOUNTER — Ambulatory Visit: Payer: Self-pay | Admitting: Family Medicine

## 2008-06-02 ENCOUNTER — Ambulatory Visit: Payer: Self-pay | Admitting: Family Medicine

## 2008-06-02 DIAGNOSIS — F329 Major depressive disorder, single episode, unspecified: Secondary | ICD-10-CM | POA: Insufficient documentation

## 2008-06-18 ENCOUNTER — Telehealth: Payer: Self-pay | Admitting: Gastroenterology

## 2008-06-18 ENCOUNTER — Telehealth (INDEPENDENT_AMBULATORY_CARE_PROVIDER_SITE_OTHER): Payer: Self-pay | Admitting: *Deleted

## 2008-06-23 ENCOUNTER — Ambulatory Visit: Payer: Self-pay | Admitting: Internal Medicine

## 2008-06-23 DIAGNOSIS — R1319 Other dysphagia: Secondary | ICD-10-CM | POA: Insufficient documentation

## 2008-07-11 ENCOUNTER — Ambulatory Visit: Payer: Self-pay | Admitting: Family Medicine

## 2008-07-11 DIAGNOSIS — G894 Chronic pain syndrome: Secondary | ICD-10-CM | POA: Insufficient documentation

## 2008-07-11 LAB — CONVERTED CEMR LAB: Glucose, Bld: 179 mg/dL

## 2008-07-31 ENCOUNTER — Emergency Department (HOSPITAL_COMMUNITY): Admission: EM | Admit: 2008-07-31 | Discharge: 2008-08-01 | Payer: Self-pay | Admitting: Emergency Medicine

## 2008-08-19 ENCOUNTER — Telehealth (INDEPENDENT_AMBULATORY_CARE_PROVIDER_SITE_OTHER): Payer: Self-pay | Admitting: *Deleted

## 2008-08-30 ENCOUNTER — Ambulatory Visit: Payer: Self-pay | Admitting: Internal Medicine

## 2008-08-30 ENCOUNTER — Inpatient Hospital Stay (HOSPITAL_COMMUNITY): Admission: EM | Admit: 2008-08-30 | Discharge: 2008-09-01 | Payer: Self-pay | Admitting: Emergency Medicine

## 2008-08-30 ENCOUNTER — Ambulatory Visit: Payer: Self-pay | Admitting: Cardiology

## 2008-09-01 ENCOUNTER — Encounter (INDEPENDENT_AMBULATORY_CARE_PROVIDER_SITE_OTHER): Payer: Self-pay | Admitting: Internal Medicine

## 2008-10-02 ENCOUNTER — Ambulatory Visit: Payer: Self-pay | Admitting: Family Medicine

## 2008-10-08 ENCOUNTER — Telehealth: Payer: Self-pay | Admitting: Family Medicine

## 2008-10-14 ENCOUNTER — Telehealth: Payer: Self-pay | Admitting: Family Medicine

## 2008-10-20 ENCOUNTER — Encounter: Payer: Self-pay | Admitting: Family Medicine

## 2008-11-03 ENCOUNTER — Encounter: Admission: RE | Admit: 2008-11-03 | Discharge: 2008-11-03 | Payer: Self-pay | Admitting: Orthopedic Surgery

## 2008-11-04 ENCOUNTER — Telehealth (INDEPENDENT_AMBULATORY_CARE_PROVIDER_SITE_OTHER): Payer: Self-pay | Admitting: *Deleted

## 2009-01-01 ENCOUNTER — Ambulatory Visit: Payer: Self-pay | Admitting: Family Medicine

## 2009-01-05 ENCOUNTER — Ambulatory Visit: Payer: Self-pay | Admitting: Family Medicine

## 2009-01-08 ENCOUNTER — Encounter: Admission: RE | Admit: 2009-01-08 | Discharge: 2009-01-08 | Payer: Self-pay | Admitting: Family Medicine

## 2009-01-09 ENCOUNTER — Encounter (INDEPENDENT_AMBULATORY_CARE_PROVIDER_SITE_OTHER): Payer: Self-pay | Admitting: *Deleted

## 2009-01-09 ENCOUNTER — Ambulatory Visit: Payer: Self-pay | Admitting: Family Medicine

## 2009-01-12 ENCOUNTER — Telehealth (INDEPENDENT_AMBULATORY_CARE_PROVIDER_SITE_OTHER): Payer: Self-pay | Admitting: *Deleted

## 2009-01-12 LAB — CONVERTED CEMR LAB
Albumin: 3.8 g/dL (ref 3.5–5.2)
Basophils Absolute: 0.1 10*3/uL (ref 0.0–0.1)
CO2: 30 meq/L (ref 19–32)
Calcium: 9.1 mg/dL (ref 8.4–10.5)
Creatinine,U: 147.1 mg/dL
Eosinophils Absolute: 0.2 10*3/uL (ref 0.0–0.7)
Glucose, Bld: 194 mg/dL — ABNORMAL HIGH (ref 70–99)
HCT: 32.5 % — ABNORMAL LOW (ref 36.0–46.0)
HDL: 45 mg/dL (ref >39.00)
Hemoglobin: 10.8 g/dL — ABNORMAL LOW (ref 12.0–15.0)
Hgb A1c MFr Bld: 6.1 % (ref 4.6–6.5)
Lymphs Abs: 3.1 10*3/uL (ref 0.7–4.0)
MCHC: 33 g/dL (ref 30.0–36.0)
Microalb Creat Ratio: 3.4 mg/g (ref 0.0–30.0)
Monocytes Absolute: 0.5 10*3/uL (ref 0.1–1.0)
Neutro Abs: 4.9 10*3/uL (ref 1.4–7.7)
Potassium: 3.4 meq/L — ABNORMAL LOW (ref 3.5–5.1)
RDW: 12.9 % (ref 11.5–14.6)
Sodium: 141 meq/L (ref 135–145)
Vit D, 25-Hydroxy: 12 ng/mL — ABNORMAL LOW (ref 30–89)

## 2009-01-13 ENCOUNTER — Encounter (INDEPENDENT_AMBULATORY_CARE_PROVIDER_SITE_OTHER): Payer: Self-pay | Admitting: *Deleted

## 2009-01-22 ENCOUNTER — Encounter: Admission: RE | Admit: 2009-01-22 | Discharge: 2009-01-22 | Payer: Self-pay | Admitting: Family Medicine

## 2009-01-30 ENCOUNTER — Encounter: Payer: Self-pay | Admitting: Family Medicine

## 2009-02-03 ENCOUNTER — Telehealth: Payer: Self-pay | Admitting: Family Medicine

## 2009-02-12 ENCOUNTER — Encounter: Payer: Self-pay | Admitting: Family Medicine

## 2009-02-17 ENCOUNTER — Telehealth (INDEPENDENT_AMBULATORY_CARE_PROVIDER_SITE_OTHER): Payer: Self-pay | Admitting: *Deleted

## 2009-02-20 ENCOUNTER — Ambulatory Visit: Payer: Self-pay | Admitting: Family Medicine

## 2009-02-20 DIAGNOSIS — R220 Localized swelling, mass and lump, head: Secondary | ICD-10-CM | POA: Insufficient documentation

## 2009-02-20 DIAGNOSIS — R221 Localized swelling, mass and lump, neck: Secondary | ICD-10-CM

## 2009-02-24 ENCOUNTER — Ambulatory Visit: Payer: Self-pay | Admitting: Family Medicine

## 2009-02-26 ENCOUNTER — Encounter: Admission: RE | Admit: 2009-02-26 | Discharge: 2009-02-26 | Payer: Self-pay | Admitting: Family Medicine

## 2009-03-05 ENCOUNTER — Telehealth (INDEPENDENT_AMBULATORY_CARE_PROVIDER_SITE_OTHER): Payer: Self-pay | Admitting: *Deleted

## 2009-03-05 LAB — CONVERTED CEMR LAB
Albumin: 3.7 g/dL (ref 3.5–5.2)
Alkaline Phosphatase: 56 units/L (ref 39–117)
Basophils Absolute: 0.1 10*3/uL (ref 0.0–0.1)
CO2: 30 meq/L (ref 19–32)
Calcium: 8.7 mg/dL (ref 8.4–10.5)
Creatinine, Ser: 1.1 mg/dL (ref 0.4–1.2)
Eosinophils Absolute: 0.3 10*3/uL (ref 0.0–0.7)
Free T4: 0.6 ng/dL (ref 0.6–1.6)
Glucose, Bld: 221 mg/dL — ABNORMAL HIGH (ref 70–99)
Hemoglobin: 11 g/dL — ABNORMAL LOW (ref 12.0–15.0)
Lymphocytes Relative: 33.8 % (ref 12.0–46.0)
MCHC: 31.9 g/dL (ref 30.0–36.0)
MCV: 93 fL (ref 78.0–100.0)
Monocytes Absolute: 0.5 10*3/uL (ref 0.1–1.0)
Neutro Abs: 4.7 10*3/uL (ref 1.4–7.7)
RDW: 12.7 % (ref 11.5–14.6)
T3, Free: 2.6 pg/mL (ref 2.3–4.2)
TSH: 2.1 microintl units/mL (ref 0.35–5.50)

## 2009-03-09 ENCOUNTER — Telehealth: Payer: Self-pay | Admitting: Family Medicine

## 2009-03-11 LAB — HM DIABETES EYE EXAM

## 2009-03-12 ENCOUNTER — Telehealth: Payer: Self-pay | Admitting: Family Medicine

## 2009-03-26 ENCOUNTER — Ambulatory Visit: Payer: Self-pay | Admitting: Family Medicine

## 2009-03-26 DIAGNOSIS — Z9181 History of falling: Secondary | ICD-10-CM | POA: Insufficient documentation

## 2009-03-27 ENCOUNTER — Encounter: Admission: RE | Admit: 2009-03-27 | Discharge: 2009-03-27 | Payer: Self-pay | Admitting: Family Medicine

## 2009-03-30 ENCOUNTER — Encounter: Payer: Self-pay | Admitting: Family Medicine

## 2009-04-09 LAB — CONVERTED CEMR LAB
BUN: 16 mg/dL (ref 6–23)
Basophils Absolute: 0.1 10*3/uL (ref 0.0–0.1)
Basophils Relative: 0.9 % (ref 0.0–3.0)
Calcium: 9.1 mg/dL (ref 8.4–10.5)
Creatinine, Ser: 1.1 mg/dL (ref 0.4–1.2)
Eosinophils Absolute: 0.2 10*3/uL (ref 0.0–0.7)
HCT: 35.3 % — ABNORMAL LOW (ref 36.0–46.0)
Hemoglobin: 11.5 g/dL — ABNORMAL LOW (ref 12.0–15.0)
Lymphs Abs: 2.3 10*3/uL (ref 0.7–4.0)
MCHC: 32.7 g/dL (ref 30.0–36.0)
MCV: 93 fL (ref 78.0–100.0)
Monocytes Absolute: 0.4 10*3/uL (ref 0.1–1.0)
Neutro Abs: 3.9 10*3/uL (ref 1.4–7.7)
RBC: 3.79 M/uL — ABNORMAL LOW (ref 3.87–5.11)
RDW: 12.9 % (ref 11.5–14.6)
Transferrin: 237 mg/dL (ref 212.0–360.0)
Vitamin B-12: 459 pg/mL (ref 211–911)

## 2009-04-14 ENCOUNTER — Ambulatory Visit: Payer: Self-pay | Admitting: Family Medicine

## 2009-04-27 LAB — CONVERTED CEMR LAB
ALT: 14 units/L (ref 0–35)
AST: 16 units/L (ref 0–37)
Albumin: 3.7 g/dL (ref 3.5–5.2)
Alkaline Phosphatase: 63 units/L (ref 39–117)
BUN: 9 mg/dL (ref 6–23)
Basophils Absolute: 0 10*3/uL (ref 0.0–0.1)
Basophils Relative: 0.2 % (ref 0.0–3.0)
Bilirubin, Direct: 0.1 mg/dL (ref 0.0–0.3)
CO2: 33 meq/L — ABNORMAL HIGH (ref 19–32)
Calcium: 8.8 mg/dL (ref 8.4–10.5)
Chloride: 109 meq/L (ref 96–112)
Cholesterol: 133 mg/dL (ref 0–200)
Creatinine, Ser: 1 mg/dL (ref 0.4–1.2)
Eosinophils Absolute: 0.2 10*3/uL (ref 0.0–0.7)
Eosinophils Relative: 2.3 % (ref 0.0–5.0)
Ferritin: 39.6 ng/mL (ref 10.0–291.0)
GFR calc non Af Amer: 70.59 mL/min (ref >60)
Glucose, Bld: 87 mg/dL (ref 70–99)
HCT: 34.4 % — ABNORMAL LOW (ref 36.0–46.0)
HDL: 52.4 mg/dL (ref >39.00)
Hemoglobin: 11.1 g/dL — ABNORMAL LOW (ref 12.0–15.0)
Hgb A1c MFr Bld: 6.5 % (ref 4.6–6.5)
Iron: 60 ug/dL (ref 42–145)
LDL Cholesterol: 62 mg/dL (ref 0–99)
Lymphocytes Relative: 47.2 % — ABNORMAL HIGH (ref 12.0–46.0)
Lymphs Abs: 3.9 10*3/uL (ref 0.7–4.0)
MCHC: 32.2 g/dL (ref 30.0–36.0)
MCV: 94.2 fL (ref 78.0–100.0)
Monocytes Absolute: 0.6 10*3/uL (ref 0.1–1.0)
Monocytes Relative: 7.8 % (ref 3.0–12.0)
Neutro Abs: 3.5 10*3/uL (ref 1.4–7.7)
Neutrophils Relative %: 42.5 % — ABNORMAL LOW (ref 43.0–77.0)
Platelets: 216 10*3/uL (ref 150.0–400.0)
Potassium: 3.6 meq/L (ref 3.5–5.1)
RBC: 3.65 M/uL — ABNORMAL LOW (ref 3.87–5.11)
RDW: 13.1 % (ref 11.5–14.6)
Saturation Ratios: 18.5 % — ABNORMAL LOW (ref 20.0–50.0)
Sodium: 145 meq/L (ref 135–145)
Total Bilirubin: 0.5 mg/dL (ref 0.3–1.2)
Total CHOL/HDL Ratio: 3
Total Protein: 7.2 g/dL (ref 6.0–8.3)
Transferrin: 231.2 mg/dL (ref 212.0–360.0)
Triglycerides: 92 mg/dL (ref 0.0–149.0)
VLDL: 18.4 mg/dL (ref 0.0–40.0)
WBC: 8.2 10*3/uL (ref 4.5–10.5)

## 2009-05-08 ENCOUNTER — Telehealth: Payer: Self-pay | Admitting: Family Medicine

## 2009-05-11 ENCOUNTER — Ambulatory Visit: Payer: Self-pay | Admitting: Family Medicine

## 2009-05-11 LAB — HM DIABETES FOOT EXAM

## 2009-05-29 ENCOUNTER — Telehealth (INDEPENDENT_AMBULATORY_CARE_PROVIDER_SITE_OTHER): Payer: Self-pay | Admitting: *Deleted

## 2009-05-29 ENCOUNTER — Encounter: Payer: Self-pay | Admitting: Family Medicine

## 2009-06-02 ENCOUNTER — Telehealth: Payer: Self-pay | Admitting: Family Medicine

## 2009-06-05 ENCOUNTER — Emergency Department (HOSPITAL_COMMUNITY): Admission: EM | Admit: 2009-06-05 | Discharge: 2009-06-05 | Payer: Self-pay | Admitting: Emergency Medicine

## 2009-06-05 ENCOUNTER — Telehealth: Payer: Self-pay | Admitting: Family Medicine

## 2009-06-08 ENCOUNTER — Ambulatory Visit: Payer: Self-pay | Admitting: Family Medicine

## 2009-06-08 DIAGNOSIS — R5383 Other fatigue: Secondary | ICD-10-CM

## 2009-06-08 DIAGNOSIS — R5381 Other malaise: Secondary | ICD-10-CM | POA: Insufficient documentation

## 2009-06-09 ENCOUNTER — Telehealth: Payer: Self-pay | Admitting: Family Medicine

## 2009-06-11 ENCOUNTER — Telehealth: Payer: Self-pay | Admitting: Family Medicine

## 2009-06-15 ENCOUNTER — Telehealth (INDEPENDENT_AMBULATORY_CARE_PROVIDER_SITE_OTHER): Payer: Self-pay | Admitting: *Deleted

## 2009-06-16 ENCOUNTER — Encounter: Payer: Self-pay | Admitting: Family Medicine

## 2009-06-16 ENCOUNTER — Telehealth (INDEPENDENT_AMBULATORY_CARE_PROVIDER_SITE_OTHER): Payer: Self-pay | Admitting: *Deleted

## 2009-06-22 ENCOUNTER — Ambulatory Visit: Payer: Self-pay | Admitting: Family Medicine

## 2009-06-22 LAB — CONVERTED CEMR LAB
Bilirubin Urine: NEGATIVE
Blood in Urine, dipstick: NEGATIVE
Ketones, urine, test strip: NEGATIVE
Nitrite: NEGATIVE
Protein, U semiquant: NEGATIVE
Urobilinogen, UA: 0.2
Whiff Test: POSITIVE

## 2009-06-23 ENCOUNTER — Encounter: Payer: Self-pay | Admitting: Family Medicine

## 2009-06-23 LAB — CONVERTED CEMR LAB
Basophils Absolute: 0 10*3/uL (ref 0.0–0.1)
Eosinophils Absolute: 0.1 10*3/uL (ref 0.0–0.7)
Folate: >20 ng/mL
Hemoglobin: 11.7 g/dL — ABNORMAL LOW (ref 12.0–15.0)
Iron: 74 ug/dL (ref 42–145)
Lymphocytes Relative: 40.8 % (ref 12.0–46.0)
MCHC: 33.1 g/dL (ref 30.0–36.0)
Neutro Abs: 3.7 10*3/uL (ref 1.4–7.7)
Platelets: 219 10*3/uL (ref 150.0–400.0)
RDW: 13.4 % (ref 11.5–14.6)
Saturation Ratios: 24 % (ref 20.0–50.0)
Transferrin: 219.8 mg/dL (ref 212.0–360.0)
Vitamin B-12: 488 pg/mL (ref 211–911)

## 2009-06-30 ENCOUNTER — Ambulatory Visit: Payer: Self-pay | Admitting: Gastroenterology

## 2009-06-30 DIAGNOSIS — K3184 Gastroparesis: Secondary | ICD-10-CM | POA: Insufficient documentation

## 2009-07-06 ENCOUNTER — Encounter: Admission: RE | Admit: 2009-07-06 | Discharge: 2009-07-06 | Payer: Self-pay | Admitting: Family Medicine

## 2009-07-09 ENCOUNTER — Ambulatory Visit: Payer: Self-pay | Admitting: Family Medicine

## 2009-07-09 LAB — CONVERTED CEMR LAB
Glucose, Urine, Semiquant: NEGATIVE
Ketones, urine, test strip: NEGATIVE
Nitrite: NEGATIVE
Specific Gravity, Urine: 1.02
pH: 6.5

## 2009-07-13 ENCOUNTER — Telehealth (INDEPENDENT_AMBULATORY_CARE_PROVIDER_SITE_OTHER): Payer: Self-pay | Admitting: *Deleted

## 2009-07-17 ENCOUNTER — Encounter (INDEPENDENT_AMBULATORY_CARE_PROVIDER_SITE_OTHER): Payer: Self-pay | Admitting: *Deleted

## 2009-07-22 ENCOUNTER — Telehealth: Payer: Self-pay | Admitting: Family Medicine

## 2009-07-27 ENCOUNTER — Encounter: Payer: Self-pay | Admitting: Family Medicine

## 2009-08-14 ENCOUNTER — Ambulatory Visit: Payer: Self-pay | Admitting: Family Medicine

## 2009-08-14 DIAGNOSIS — N898 Other specified noninflammatory disorders of vagina: Secondary | ICD-10-CM | POA: Insufficient documentation

## 2009-08-15 ENCOUNTER — Encounter: Payer: Self-pay | Admitting: Family Medicine

## 2009-08-17 LAB — CONVERTED CEMR LAB: GC Probe Amp, Urine: NEGATIVE

## 2009-08-25 ENCOUNTER — Telehealth (INDEPENDENT_AMBULATORY_CARE_PROVIDER_SITE_OTHER): Payer: Self-pay | Admitting: *Deleted

## 2009-09-07 ENCOUNTER — Encounter: Payer: Self-pay | Admitting: Family Medicine

## 2009-10-20 ENCOUNTER — Ambulatory Visit: Payer: Self-pay | Admitting: Family Medicine

## 2009-10-20 DIAGNOSIS — B354 Tinea corporis: Secondary | ICD-10-CM | POA: Insufficient documentation

## 2009-10-22 ENCOUNTER — Telehealth: Payer: Self-pay | Admitting: Gastroenterology

## 2009-10-23 ENCOUNTER — Encounter: Payer: Self-pay | Admitting: Family Medicine

## 2009-10-26 ENCOUNTER — Ambulatory Visit: Payer: Self-pay | Admitting: Gastroenterology

## 2009-10-26 ENCOUNTER — Telehealth: Payer: Self-pay | Admitting: Gastroenterology

## 2009-11-16 ENCOUNTER — Encounter: Payer: Self-pay | Admitting: Family Medicine

## 2009-12-01 ENCOUNTER — Telehealth: Payer: Self-pay | Admitting: Family Medicine

## 2010-01-07 ENCOUNTER — Encounter: Payer: Self-pay | Admitting: Family Medicine

## 2010-01-11 ENCOUNTER — Encounter
Admission: RE | Admit: 2010-01-11 | Discharge: 2010-01-11 | Payer: Self-pay | Source: Home / Self Care | Attending: Family Medicine | Admitting: Family Medicine

## 2010-01-11 LAB — HM MAMMOGRAPHY

## 2010-01-12 ENCOUNTER — Encounter: Payer: Self-pay | Admitting: Gastroenterology

## 2010-01-12 ENCOUNTER — Telehealth: Payer: Self-pay | Admitting: Family Medicine

## 2010-01-13 ENCOUNTER — Encounter: Payer: Self-pay | Admitting: Family Medicine

## 2010-02-15 ENCOUNTER — Ambulatory Visit
Admission: RE | Admit: 2010-02-15 | Discharge: 2010-02-15 | Payer: Self-pay | Source: Home / Self Care | Attending: Family Medicine | Admitting: Family Medicine

## 2010-02-15 DIAGNOSIS — Z780 Asymptomatic menopausal state: Secondary | ICD-10-CM | POA: Insufficient documentation

## 2010-02-16 ENCOUNTER — Telehealth: Payer: Self-pay | Admitting: Family Medicine

## 2010-02-21 ENCOUNTER — Encounter: Payer: Self-pay | Admitting: Emergency Medicine

## 2010-02-22 ENCOUNTER — Other Ambulatory Visit: Payer: Self-pay | Admitting: Family Medicine

## 2010-02-22 ENCOUNTER — Ambulatory Visit
Admission: RE | Admit: 2010-02-22 | Discharge: 2010-02-22 | Payer: Self-pay | Source: Home / Self Care | Attending: Family Medicine | Admitting: Family Medicine

## 2010-02-22 ENCOUNTER — Encounter: Payer: Self-pay | Admitting: Neurosurgery

## 2010-02-22 ENCOUNTER — Encounter: Payer: Self-pay | Admitting: Gastroenterology

## 2010-02-22 LAB — CBC WITH DIFFERENTIAL/PLATELET
Basophils Absolute: 0 10*3/uL (ref 0.0–0.1)
Hemoglobin: 11.9 g/dL — ABNORMAL LOW (ref 12.0–15.0)
Lymphocytes Relative: 35.1 % (ref 12.0–46.0)
Monocytes Relative: 5.4 % (ref 3.0–12.0)
Platelets: 245 10*3/uL (ref 150.0–400.0)
RDW: 13.6 % (ref 11.5–14.6)

## 2010-02-22 LAB — HEPATIC FUNCTION PANEL
AST: 22 U/L (ref 0–37)
Albumin: 4 g/dL (ref 3.5–5.2)
Alkaline Phosphatase: 66 U/L (ref 39–117)
Total Protein: 7.2 g/dL (ref 6.0–8.3)

## 2010-02-22 LAB — HEMOGLOBIN A1C: Hgb A1c MFr Bld: 7.2 % — ABNORMAL HIGH (ref 4.6–6.5)

## 2010-02-22 LAB — IBC PANEL
Iron: 123 ug/dL (ref 42–145)
Saturation Ratios: 37.2 % (ref 20.0–50.0)

## 2010-02-22 LAB — BASIC METABOLIC PANEL
BUN: 15 mg/dL (ref 6–23)
CO2: 29 mEq/L (ref 19–32)
Calcium: 9.1 mg/dL (ref 8.4–10.5)
Glucose, Bld: 154 mg/dL — ABNORMAL HIGH (ref 70–99)
Sodium: 138 mEq/L (ref 135–145)

## 2010-02-22 LAB — LIPID PANEL
Cholesterol: 135 mg/dL (ref 0–200)
HDL: 44.7 mg/dL (ref >39.00)

## 2010-02-26 ENCOUNTER — Encounter
Admission: RE | Admit: 2010-02-26 | Discharge: 2010-02-26 | Payer: Self-pay | Source: Home / Self Care | Attending: Family Medicine | Admitting: Family Medicine

## 2010-02-26 ENCOUNTER — Encounter: Payer: Self-pay | Admitting: Family Medicine

## 2010-02-28 LAB — CONVERTED CEMR LAB
ALT: 19 units/L (ref 0–35)
AST: 18 units/L (ref 0–37)
Alkaline Phosphatase: 70 units/L (ref 39–117)
BUN: 11 mg/dL (ref 6–23)
Basophils Relative: 0.6 % (ref 0.0–1.0)
CO2: 30 meq/L (ref 19–32)
Calcium: 9.2 mg/dL (ref 8.4–10.5)
Chloride: 105 meq/L (ref 96–112)
Creatinine, Ser: 1 mg/dL (ref 0.4–1.2)
Hemoglobin: 11.1 g/dL — ABNORMAL LOW (ref 12.0–15.0)
Monocytes Absolute: 0.5 10*3/uL (ref 0.2–0.7)
Monocytes Relative: 7 % (ref 3.0–11.0)
Platelets: 264 10*3/uL (ref 150–400)
RBC: 3.65 M/uL — ABNORMAL LOW (ref 3.87–5.11)
RDW: 13.6 % (ref 11.5–14.6)
TSH: 1.19 microintl units/mL (ref 0.35–5.50)
Total Bilirubin: 0.9 mg/dL (ref 0.3–1.2)
Total Protein: 7.3 g/dL (ref 6.0–8.3)

## 2010-03-02 NOTE — Letter (Signed)
Summary: [facility]  [facility]   Imported By: Lanelle Bal 11/30/2009 13:17:56  _____________________________________________________________________  External Attachment:    Type:   Image     Comment:   External Document

## 2010-03-02 NOTE — Progress Notes (Signed)
Summary: Refill Request  Phone Note Refill Request Call back at [phone] Message from:  Pharmacy on Jun 11, 2009 2:10 PM  Refills Requested: Medication #1:  KLONOPIN 0.5 MG TABS 1 by mouth two times a day   Dosage confirmed as above?Dosage Confirmed   Brand Name Necessary? No   Supply Requested: 1 month   [facility] [address]   Next Appointment Scheduled: none Initial call taken by: Harold Barban,  Jun 11, 2009 2:10 PM  Follow-up for Phone Call        last ov- 06/08/09. Army Fossa CMA  Jun 11, 2009 2:17 PM   Additional Follow-up for Phone Call Additional follow up Details #1::        ok to refill x1  2 refills Additional Follow-up by: Loreen Freud DO,  Jun 11, 2009 2:36 PM    Prescriptions: KLONOPIN 0.5 MG TABS (CLONAZEPAM) 1 by mouth two times a day  #60 x 2   Entered by:   Army Fossa CMA   Authorized by:   Loreen Freud DO   Signed by:   Army Fossa CMA on 06/11/2009   Method used:   Printed then faxed [facility] ...       [facility]  [address] [id number]       [address]       [place], [place]  [address]       Ph: [phone]       Fax: [phone]   RxID:   [id number]

## 2010-03-02 NOTE — Assessment & Plan Note (Signed)
Summary: med refill /depression/cbs   Vital Signs:  Patient profile:   70 year old female Height:      65.25 inches Weight:      173.2 pounds Temp:     98.6 degrees F oral Pulse rate:   76 / minute Pulse rhythm:   regular BP sitting:   144 / 90  (left arm)  Vitals Entered By: Almeta Monas CMA Duncan Dull) (October 20, 2009 1:50 PM) CC: f/u meds    History of Present Illness: Pt here for med refill and cont to complain about vaginal odor and states she has the same smell from skin folds.    Pt saw gyn and nothing was found.    Pt also requesting bathroom to be made handicap accessible.    Current Medications (verified): 1)  Glipizide 10 Mg Tabs (Glipizide) .Marland Kitchen.. 1 Two Times A Day 2)  Aggrenox 25-200 Mg  Cp12 (Aspirin-Dipyridamole) .Marland Kitchen.. 1 By Mouth Two Times A Day 3)  Klor-Con M20 20 Meq Cr-Tabs (Potassium Chloride Crys Cr) .... Take 1 Tab Two Times A Day 4)  Diovan Hct 320-25 Mg Tabs (Valsartan-Hydrochlorothiazide) .Marland Kitchen.. 1 By Mouth Once Daily 5)  Duragesic-50 50 Mcg/hr Pt72 (Fentanyl) .Marland Kitchen.. 1 Patch Every 72 Hours 6)  Lipitor 20 Mg Tabs (Atorvastatin Calcium) .Marland Kitchen.. 1 By Mouth At Bedtime 7)  Cymbalta 60 Mg Cpep (Duloxetine Hcl) .... 2  By Mouth Daily For Depression and Pain 8)  Ferrous Sulfate 325 (65 Fe) Mg Tabs (Ferrous Sulfate) .Marland Kitchen.. 1 By Mouth Once Daily 9)  Stool Softener 100 Mg Caps (Docusate Sodium) .... Take By Mouth Once Daily As Needed 10)  Nexium 40 Mg Cpdr (Esomeprazole Magnesium) .... One Tablet By Mouth Two Times A Day 11)  Naftin 1 % Crea (Naftifine Hcl) .... Apply Once Daily 12)  Nystatin 100000 Unit/gm Powd (Nystatin) .... Use Three Times A Day  Allergies (verified): 1)  Vytorin  Past History:  Past medical, surgical, family and social histories (including risk factors) reviewed for relevance to current acute and chronic problems.  Past Medical History: Reviewed history from 06/25/2009 and no changes required. Diabetes mellitus, type  II Hyperlipidemia Hypertension Cerebrovascular accident, hx of GERD CHRONIC BACK PAIN Anxiety Disorder Depression Allergies Hiatal hernia Hemorrhoids Delayed Gastric Empyting  Past Surgical History: Reviewed history from 04/16/2007 and no changes required. Tonsillectomy  Tubaligation (1975) Hysterectomy (4098) Appendectomy (1982) Lt Breast; benign tumor (1975)  Family History: Reviewed history from 06/30/2009 and no changes required. Family History of Cervical cancer Family History Diabetes 1st degree relative Family History Hypertension Family History Other cancer- Breast, Prostate Family History MI No FH of Colon Cancer:  Social History: Reviewed history from 06/30/2009 and no changes required. Married Never Smoked Alcohol use-no Patient has never smoked.  Illicit Drug Use - no Daily Caffeine Use  Review of Systems      See HPI  Physical Exam  General:  Well-developed,well-nourished,in no acute distress; alert,appropriate and cooperative throughout examination Abdomen:  + between folds of skin on abd + odor and rash  Psych:  Oriented X3 and normally interactive.     Impression & Recommendations:  Problem # 1:  TINEA CORPORIS (ICD-110.5) nystatin powder area cleaned well and covered  Problem # 2:  VAGINAL DISCHARGE (ICD-623.5) try rephresh f/u gyn  Problem # 3:  WEAKNESS (ICD-780.79)  Orders: Home Health Referral (Home Health)  Problem # 4:  FALL, HX OF (ICD-V15.88)  Orders: Home Health Referral (Home Health)  Complete Medication List: 1)  Glipizide 10 Mg Tabs (  Glipizide) .Marland Kitchen.. 1 two times a day 2)  Aggrenox 25-200 Mg Cp12 (Aspirin-dipyridamole) .Marland Kitchen.. 1 by mouth two times a day 3)  Klor-con M20 20 Meq Cr-tabs (Potassium chloride crys cr) .... Take 1 tab two times a day 4)  Diovan Hct 320-25 Mg Tabs (Valsartan-hydrochlorothiazide) .Marland Kitchen.. 1 by mouth once daily 5)  Duragesic-50 50 Mcg/hr Pt72 (Fentanyl) .Marland Kitchen.. 1 patch every 72 hours 6)  Lipitor 20 Mg  Tabs (Atorvastatin calcium) .Marland Kitchen.. 1 by mouth at bedtime 7)  Cymbalta 60 Mg Cpep (Duloxetine hcl) .... 2  by mouth daily for depression and pain 8)  Ferrous Sulfate 325 (65 Fe) Mg Tabs (Ferrous sulfate) .Marland Kitchen.. 1 by mouth once daily 9)  Stool Softener 100 Mg Caps (Docusate sodium) .... Take by mouth once daily as needed 10)  Nexium 40 Mg Cpdr (Esomeprazole magnesium) .... One tablet by mouth two times a day 11)  Naftin 1 % Crea (Naftifine hcl) .... Apply once daily 12)  Nystatin 100000 Unit/gm Powd (Nystatin) .... Use three times a day  Patient Instructions: 1)  Try Rephresh for vaginal pH 2)  f/u gyn if no better Prescriptions: NYSTATIN 100000 UNIT/GM POWD (NYSTATIN) use three times a day  #1 month x 2   Entered and Authorized by:   Loreen Freud DO   Signed by:   Loreen Freud DO on 10/20/2009   Method used:   [facility]        [facility]  [address] [phone][facility] ([facility]ail)       [address]       [place], [place]  [address]       Ph: [phone]       Fax: [phone]   RxID:   [id number] NAFTIN 1 % CREA (NAFTIFINE HCL) apply once daily  #30 g x 1   Entered and Authorized by:   Loreen Freud DO   Signed by:   Loreen Freud DO on 10/20/2009   Method used:   [facility]        [facility]  [address] [phone][facility] (retail)       [address]       [place], [place]  [address]       Ph: [phone]       Fax: [phone]   RxID:   [id number] DURAGESIC-50 50 MCG/HR PT72 (FENTANYL) 1 patch every 72 hours  #10 x 0   Entered and Authorized by:   Loreen Freud DO   Signed by:   Loreen Freud DO on 10/20/2009   Method used:   Print then Give to Patient   RxID:   [id number]   Appended Document: Orders Update Flu Vaccine Consent Questions     Do you have a history of severe allergic reactions to this vaccine? no    Any prior history of allergic reactions to egg and/or gelatin? no    Do you have a sensitivity to the preservative Thimersol?  no    Do you have a past history of Guillan-Barre Syndrome? no    Do you currently have an acute febrile illness? no    Have you ever had a severe reaction to latex? no    Vaccine information given and explained to patient? yes    Are you currently pregnant? no    Lot Number:[id number]   Exp Date:07/31/2010   Site Given  Left Deltoid IM     Clinical Lists  Changes  Orders: Added new Service order of Flu Vaccine 11yrs + MEDICARE PATIENTS (E4540) - Signed Added new Service order of Administration Flu vaccine - MCR (J8119) - Signed Observations: Added new observation of FLU VAX VIS: 08/25/2009 version (10/20/2009 14:59) Added new observation of FLU VAXLOT: [id number] (10/20/2009 14:59) Added new observation of FLU VAXMFR: [employer] (10/20/2009 14:59) Added new observation of FLU VAX EXP: 07/31/2010 (10/20/2009 14:59) Added new observation of FLU VAX DSE: 0.36ml (10/20/2009 14:59) Added new observation of FLU VAX: Fluvax 3+ (10/20/2009 14:59)

## 2010-03-02 NOTE — Therapy (Signed)
[facility]   Imported By: Lanelle Bal 06/12/2009 10:52:53  _____________________________________________________________________  External Attachment:    Type:   Image     Comment:   External Document  Appended Document: [facility] fax to Neuro  Appended Document: [facility] faxed.

## 2010-03-02 NOTE — Progress Notes (Signed)
Summary: REFERRAL  Phone Note Call from Patient Call back at Home Phone [phone]   Reason for Call: Referral Summary of Call: PT CALL WANTING A REFERRAL FOR A GYN DOCTOR BECAUSE THE DISCHARGE HAS NOT STOP AND THE APPT CAN BE MADE AFTER AUG 4.  Initial call taken by: Freddy Jaksch,  August 25, 2009 10:51 AM  Follow-up for Phone Call        pt aware referral put in awaiting appt...Marland KitchenMarland KitchenFelecia Deloach CMA  August 25, 2009 11:47 AM

## 2010-03-02 NOTE — Progress Notes (Signed)
Summary: Lab Results   Phone Note Outgoing Call   Call placed by: Army Fossa CMA,  March 05, 2009 3:10 PM Summary of Call: Regarding lab results, LMTCB:   Increase K to two times a day ---refill #60 anemic--- take slow fe-- 1 by mouth once daily ---recheck 1 month----   cbcd, ferritin, ibc panal  285.9 Signed by Loreen Freud DO on 03/04/2009 at 6:52 PM  Follow-up for Phone Call        pt is aware. Army Fossa CMA  March 09, 2009 4:38 PM     New/Updated Medications: KLOR-CON M20 20 MEQ CR-TABS (POTASSIUM CHLORIDE CRYS CR) take 1 tab two times a day SLOW FE 160 (50 FE) MG CR-TABS (FERROUS SULFATE DRIED)  Prescriptions: KLOR-CON M20 20 MEQ CR-TABS (POTASSIUM CHLORIDE CRYS CR) take 1 tab two times a day  #60 x 0   Entered by:   Army Fossa CMA   Authorized by:   Loreen Freud DO   Signed by:   Army Fossa CMA on 03/09/2009   Method used:   [facility]        [facility]  [address] [phone][facility] (retail)       [address]       [place], [place]  [address]       Ph: [phone]       Fax: [phone]   RxID:   [id number]

## 2010-03-02 NOTE — Progress Notes (Signed)
Summary: CLONAZEPAM PRESCRIPTION  Phone Note Call from Patient Call back at Home Phone [phone]   Caller: Patient Summary of Call: PATIENT NEEDS PRESCRIPTION FOR CLONAZEPAM 0.5 MG [facility] [address]  PLEASE CALL HER WHEN IT HAS BEEN CALLED IN Initial call taken by: Jerolyn Shin,  Jun 16, 2009 11:29 AM  Follow-up for Phone Call        I recalled in meds,pt aware. Army Fossa CMA  Jun 16, 2009 11:32 AM     Prescriptions: KLONOPIN 0.5 MG TABS (CLONAZEPAM) 1 by mouth two times a day  #60 x 0   Entered and Authorized by:   Army Fossa CMA   Signed by:   Army Fossa CMA on 06/16/2009   Method used:   Telephoned to ...       [facility]  [address] [id number]       [address]       [place], [place]  [address]       Ph: [phone]       Fax: [phone]   RxID:   [id number]

## 2010-03-02 NOTE — Progress Notes (Signed)
Summary: prior auth - APPROVED  for Cymbalta/[facility]  Phone Note Refill Request Message from:  Fax from Pharmacy on Jun 15, 2009 11:54 AM  Refills Requested: Medication #1:  CYMBALTA 60 MG CPEP 2  by mouth daily for depression and pain prior auth - prescription solutions - fax [phone]  Initial call taken by: [name],  Jun 15, 2009 12:05 PM  Follow-up for Phone Call        Prior auth in Process/ [facility] .[name]  Jun 16, 2009 9:42 AM  Prior Auth APPROVED  for Cymbalta through 06/16/10 .[name]  Jun 16, 2009 11:52 AM   Follow-up by: [name],  Jun 16, 2009 11:58 AM

## 2010-03-02 NOTE — Progress Notes (Signed)
Summary: Pt going to ER (lmom 5/6)  Phone Note Outgoing Call   Call placed by: Army Fossa CMA,  Jun 05, 2009 10:44 AM Summary of Call: Pt called and left voicemail stating she was confused to which medications she needed to stop taking. I told pt she was to stop the citalopram. She understood this time. She states that she seems very weak, cannot walk, she feels very confused, slurring her words. Pt states that she almost called 911 but she felt that she needed to call us first. I informed pt that it would be best for her to go to the ER. She states she would get dressed and go. I asked her if she had a driver if not to call [phone]. She said she would call her daughter and have her daughter take her. Army Fossa CMA  Jun 05, 2009 10:46 AM   Follow-up for Phone Call        check on her later Follow-up by: Loreen Freud DO,  Jun 05, 2009 11:26 AM  Additional Follow-up for Phone Call Additional follow up Details #1::        Left message for pt to call back, just checking on pt. Army Fossa CMA  Jun 05, 2009 2:57 PM  spoke with pt -- she is feeling ok today.  states that work up in ed was neg, no stroke.  scheduled hosp f/u for monday with dr Sabino Niemann  Jun 06, 2009 9:45 AM

## 2010-03-02 NOTE — Progress Notes (Signed)
Summary: refill - will pick up today  Phone Note Refill Request Message from:  Patient  Refills Requested: Medication #1:  DURAGESIC-50 50 MCG/HR PT72 1 patch every 72 hours patient will pick up this afternoon [id number]   Initial call taken by: Okey Regal Spring,  December 01, 2009 9:52 AM  Follow-up for Phone Call        last seen and filled 10/20/09.Marland KitchenMarland KitchenPLease advise Follow-up by: Almeta Monas CMA Duncan Dull),  December 01, 2009 11:40 AM  Additional Follow-up for Phone Call Additional follow up Details #1::        ok to refill x1 Additional Follow-up by: Loreen Freud DO,  December 01, 2009 12:05 PM    Additional Follow-up for Phone Call Additional follow up Details #2::    Rx left at check-In..... Almeta Monas CMA Duncan Dull)  December 01, 2009 1:38 PM'  Prescriptions: DURAGESIC-50 50 MCG/HR PT72 (FENTANYL) 1 patch every 72 hours  #10 x 0   Entered and Authorized by:   Loreen Freud DO   Signed by:   Loreen Freud DO on 12/01/2009   Method used:   Print then Give to Patient   RxID:   [id number]

## 2010-03-02 NOTE — Medication Information (Signed)
Summary: [facility]   Imported By: Lanelle Bal 02/17/2009 15:01:12  _____________________________________________________________________  External Attachment:    Type:   Image     Comment:   External Document

## 2010-03-02 NOTE — Progress Notes (Signed)
Summary: Lab Results (lmom 6/13,6/14,6/15)  Phone Note Outgoing Call   Call placed by: Army Fossa CMA,  July 13, 2009 3:22 PM Reason for Call: Discuss lab or test results Summary of Call: Regarding lab results, LMTCB:  contaminated ---repeat if symptomatic Signed by Loreen Freud DO on 07/13/2009 at 2:00 PM   Follow-up for Phone Call        Left message for pt to call back. Army Fossa CMA  July 14, 2009 10:11 AM   Additional Follow-up for Phone Call Additional follow up Details #1::        LMTCB. Army Fossa CMA  July 15, 2009 3:21 PM     Additional Follow-up for Phone Call Additional follow up Details #2::    mailed pt a letter to contact office. Army Fossa CMA  July 17, 2009 8:49 AM

## 2010-03-02 NOTE — Medication Information (Signed)
Summary: Info Regarding Cymbalta from Patient  Info Regarding Cymbalta from Patient   Imported By: Lanelle Bal 06/03/2009 09:41:39  _____________________________________________________________________  External Attachment:    Type:   Image     Comment:   External Document

## 2010-03-02 NOTE — Progress Notes (Signed)
Summary: refill   Phone Note Refill Request   Refills Requested: Medication #1:  DURAGESIC-25 25 MCG/HR PT72 1 every 72 hours   Last Refilled: 02/03/2009 last ov- 02/20/09   Follow-up for Phone Call        ok to refill Follow-up by: Loreen Freud DO,  March 09, 2009 4:19 PM  Additional Follow-up for Phone Call Additional follow up Details #1::        Pt is aware. Army Fossa CMA  March 09, 2009 4:37 PM     Prescriptions: DURAGESIC-25 25 MCG/HR PT72 (FENTANYL) 1 every 72 hours  #10 x 0   Entered by:   Army Fossa CMA   Authorized by:   Loreen Freud DO   Signed by:   Army Fossa CMA on 03/09/2009   Method used:   Printed then [facility] ...       [facility]  [address] [id number]       [address]       [place], [place]  [address]       Ph: [phone]       Fax: [phone]   RxID:   [id number]

## 2010-03-02 NOTE — Assessment & Plan Note (Signed)
Summary: DISCUSS MEDS/KDC   Vital Signs:  Patient profile:   70 year old female Weight:      172 pounds Pulse rate:   84 / minute Pulse rhythm:   regular BP sitting:   128 / 84  (left arm) Cuff size:   regular  Vitals Entered By: Army Fossa CMA (May 11, 2009 4:08 PM) CC: Discuss meds- dexilant is not working well.   History of Present Illness: Pt here to go over medication. Pt is not taking dexilant two times a day and never had f/u appoint for EGD.  Pt c/o increased GERD symptoms.  Type 1 diabetes mellitus follow-up      This is a 70 year old woman who presents with Type 2 diabetes mellitus follow-up.  The patient denies polyuria, polydipsia, blurred vision, self managed hypoglycemia, hypoglycemia requiring help, weight loss, weight gain, and numbness of extremities.  The patient denies the following symptoms: neuropathic pain, chest pain, vomiting, orthostatic symptoms, poor wound healing, intermittent claudication, vision loss, and foot ulcer.  Since the last visit the patient reports poor dietary compliance, compliance with medications, exercising regularly, and monitoring blood glucose.  The patient has been measuring capillary blood glucose before breakfast.  Since the last visit, the patient reports having had eye care by an ophthalmologist.    Hyperlipidemia follow-up      The patient also presents for Hyperlipidemia follow-up.  The patient denies muscle aches, GI upset, abdominal pain, flushing, itching, constipation, diarrhea, and fatigue.  The patient denies the following symptoms: chest pain/pressure, exercise intolerance, dypsnea, palpitations, syncope, and pedal edema.  Compliance with medications (by patient report) has been near 100%.  Dietary compliance has been fair.  The patient reports exercising occasionally.    Hypertension follow-up      The patient also presents for Hypertension follow-up.  The patient denies lightheadedness, urinary frequency, headaches,  edema, impotence, rash, and fatigue.  The patient denies the following associated symptoms: chest pain, chest pressure, exercise intolerance, dyspnea, palpitations, syncope, leg edema, and pedal edema.  Compliance with medications (by patient report) has been near 100%.  The patient reports that dietary compliance has been fair.  The patient reports exercising occasionally.  Adjunctive measures currently used by the patient include salt restriction.    Allergies: 1)  Vytorin  Physical Exam  General:  Well-developed,well-nourished,in no acute distress; alert,appropriate and cooperative throughout examination Neck:  No deformities, masses, or tenderness noted. Lungs:  Normal respiratory effort, chest expands symmetrically. Lungs are clear to auscultation, no crackles or wheezes. Heart:  normal rate and no murmur.   Extremities:  No clubbing, cyanosis, edema, or deformity noted with normal full range of motion of all joints.   Cervical Nodes:  No lymphadenopathy noted Psych:  Cognition and judgment appear intact. Alert and cooperative with normal attention span and concentration. No apparent delusions, illusions, hallucinations  Diabetes Management Exam:    Foot Exam (with socks and/or shoes not present):       Sensory-Pinprick/Light touch:          Left medial foot (L-4): normal          Left dorsal foot (L-5): normal          Left lateral foot (S-1): normal          Right medial foot (L-4): normal          Right dorsal foot (L-5): normal          Right lateral foot (S-1): normal  Sensory-Monofilament:          Left foot: normal          Right foot: normal       Inspection:          Left foot: normal          Right foot: normal       Nails:          Left foot: too long          Right foot: too long    Eye Exam:       Eye Exam done elsewhere   Impression & Recommendations:  Problem # 1:  DIZZINESS (ICD-780.4)  Orders: Misc. Referral (Misc. Ref)  Problem # 2:  CHRONIC  PAIN SYNDROME (ICD-338.4) duragesic patch 75 micrograms   Problem # 3:  GERD (ICD-530.81) f/u GI The following medications were removed from the medication list:    Dexilant 30 Mg Cpdr (Dexlansoprazole) .Marland Kitchen... 1 by mouth once daily Her updated medication list for this problem includes:    Dexilant 60 Mg Cpdr (Dexlansoprazole) .Marland Kitchen... 1 by mouth two times a day  Problem # 4:  ANXIETY STATE NOS (ICD-300.00)  Her updated medication list for this problem includes:    Ativan 0.5 Mg Tabs (Lorazepam) .Marland Kitchen... 1 by mouth three times a day as needed    Klonopin 0.5 Mg Tabs (Clonazepam) .Marland Kitchen... 1 by mouth two times a day    Citalopram Hydrobromide 20 Mg Tabs (Citalopram hydrobromide) .Marland Kitchen... 1 by mouth once daily.  Problem # 5:  HYPERTENSION (ICD-401.9)  Her updated medication list for this problem includes:    Diovan Hct 320-25 Mg Tabs (Valsartan-hydrochlorothiazide) .Marland Kitchen... 1 by mouth once daily  BP today: 128/84 Prior BP: 122/84 (03/26/2009)  Labs Reviewed: K+: 3.6 (04/14/2009) Creat: : 1.0 (04/14/2009)   Chol: 133 (04/14/2009)   HDL: 52.40 (04/14/2009)   LDL: 62 (04/14/2009)   TG: 92.0 (04/14/2009)  Problem # 6:  HYPERLIPIDEMIA (ICD-272.4)  Her updated medication list for this problem includes:    Lipitor 20 Mg Tabs (Atorvastatin calcium) .Marland Kitchen... 1 by mouth at bedtime  Labs Reviewed: SGOT: 16 (04/14/2009)   SGPT: 14 (04/14/2009)   HDL:52.40 (04/14/2009), 45.00 (01/05/2009)  LDL:62 (04/14/2009), 84 (01/05/2009)  Chol:133 (04/14/2009), 150 (01/05/2009)  Trig:92.0 (04/14/2009), 107.0 (01/05/2009)  Problem # 7:  DIABETES MELLITUS, TYPE II (ICD-250.00)  Her updated medication list for this problem includes:    Glipizide 10 Mg Tabs (Glipizide) .Marland Kitchen... 1 two times a day    Diovan Hct 320-25 Mg Tabs (Valsartan-hydrochlorothiazide) .Marland Kitchen... 1 by mouth once daily  Labs Reviewed: Creat: 1.0 (04/14/2009)     Last Eye Exam: normal (01/16/2008) Reviewed HgBA1c results: 6.5 (04/14/2009)  6.1  (01/05/2009)  Orders: Podiatry Referral (Podiatry)  Problem # 8:  HYPERTENSION (ICD-401.9)  Her updated medication list for this problem includes:    Diovan Hct 320-25 Mg Tabs (Valsartan-hydrochlorothiazide) .Marland Kitchen... 1 by mouth once daily  BP today: 128/84 Prior BP: 122/84 (03/26/2009)  Labs Reviewed: K+: 3.6 (04/14/2009) Creat: : 1.0 (04/14/2009)   Chol: 133 (04/14/2009)   HDL: 52.40 (04/14/2009)   LDL: 62 (04/14/2009)   TG: 92.0 (04/14/2009)  Problem # 9:  HYPERLIPIDEMIA (ICD-272.4)  Her updated medication list for this problem includes:    Lipitor 20 Mg Tabs (Atorvastatin calcium) .Marland Kitchen... 1 by mouth at bedtime  Labs Reviewed: SGOT: 16 (04/14/2009)   SGPT: 14 (04/14/2009)   HDL:52.40 (04/14/2009), 45.00 (01/05/2009)  LDL:62 (04/14/2009), 84 (01/05/2009)  Chol:133 (04/14/2009), 150 (01/05/2009)  Trig:92.0 (04/14/2009),  107.0 (01/05/2009)  Problem # 10:  DIABETES MELLITUS, TYPE II (ICD-250.00)  Her updated medication list for this problem includes:    Glipizide 10 Mg Tabs (Glipizide) .Marland Kitchen... 1 two times a day    Diovan Hct 320-25 Mg Tabs (Valsartan-hydrochlorothiazide) .Marland Kitchen... 1 by mouth once daily  Orders: Podiatry Referral (Podiatry)  Labs Reviewed: Creat: 1.0 (04/14/2009)     Last Eye Exam: normal (01/16/2008) Reviewed HgBA1c results: 6.5 (04/14/2009)  6.1 (01/05/2009)  Complete Medication List: 1)  Glipizide 10 Mg Tabs (Glipizide) .Marland Kitchen.. 1 two times a day 2)  Aggrenox 25-200 Mg Cp12 (Aspirin-dipyridamole) .Marland Kitchen.. 1 by mouth two times a day 3)  Klor-con M20 20 Meq Cr-tabs (Potassium chloride crys cr) .... Take 1 tab two times a day 4)  Diovan Hct 320-25 Mg Tabs (Valsartan-hydrochlorothiazide) .Marland Kitchen.. 1 by mouth once daily 5)  Ativan 0.5 Mg Tabs (Lorazepam) .Marland Kitchen.. 1 by mouth three times a day as needed 6)  Duragesic-75 75 Mcg/hr Pt72 (Fentanyl) .Marland Kitchen.. 1 patch every 72 h 7)  Lipitor 20 Mg Tabs (Atorvastatin calcium) .Marland Kitchen.. 1 by mouth at bedtime 8)  Klonopin 0.5 Mg Tabs (Clonazepam) .Marland Kitchen.. 1  by mouth two times a day 9)  Slow Fe 160 (50 Fe) Mg Cr-tabs (Ferrous sulfate dried) .Marland Kitchen.. 1 by mouth two times a day 10)  Citalopram Hydrobromide 20 Mg Tabs (Citalopram hydrobromide) .Marland Kitchen.. 1 by mouth once daily. 11)  Dexilant 60 Mg Cpdr (Dexlansoprazole) .Marland Kitchen.. 1 by mouth two times a day                         Prescriptions: ATIVAN 0.5 MG TABS (LORAZEPAM) 1 by mouth three times a day as needed  #90 x 2   Entered and Authorized by:   Loreen Freud DO   Signed by:   Loreen Freud DO on 05/11/2009   Method used:   Print then Give to Patient   RxID:   [id number] DURAGESIC-75 75 MCG/HR PT72 (FENTANYL) 1 patch every 72 H  #12 x 0   Entered and Authorized by:   Loreen Freud DO   Signed by:   Loreen Freud DO on 05/11/2009   Method used:   Print then Give to Patient   RxID:   [id number]

## 2010-03-02 NOTE — Progress Notes (Signed)
Summary: DURAGESIC PATCH  Phone Note Call from Patient Call back at [facility] Phone [phone]   Summary of Call: NEEDS PRESCRIPTION FOR DURAGSIC 25 PATCH---SHE SAID THAT [facility] WAS JUST SEEING HER TO WRITE HER PRESCRIPTION AND SHE SAID DR LOWNE SAID "WELL, I CAN DO THAT FOR YOU"  [facility] [address]  (WHEREVER IT WAS SENT BEFORE) Initial call taken by: Jerolyn Shin,  February 03, 2009 10:04 AM  Follow-up for Phone Call        last ov- 01/01/09.Marland Kitchen never filled before.  Follow-up by: Army Fossa CMA,  February 03, 2009 11:52 AM  Additional Follow-up for Phone Call Additional follow up Details #1::        ok to refill duragesic Additional Follow-up by: Loreen Freud DO,  February 03, 2009 12:06 PM    Additional Follow-up for Phone Call Additional follow up Details #2::    left message for pt that rx was up front for her to pick up.  Follow-up by: Army Fossa CMA,  February 03, 2009 12:08 PM  Prescriptions: DURAGESIC-25 25 MCG/HR PT72 (FENTANYL) 1 every 72 hours  #10 x 0   Entered and Authorized by:   Loreen Freud DO   Signed by:   Loreen Freud DO on 02/03/2009   Method used:   Print then Give to Patient   RxID:   [id number]

## 2010-03-02 NOTE — Assessment & Plan Note (Signed)
Summary: GO OVER PAIN MEDICATION FOR YEAST INFECTION//TL//SPH--OK PER MU   Vital Signs:  Patient Profile:   70 Years Old Female Weight:      185.50 pounds Temp:     98.2 degrees F oral Pulse rate:   74 / minute Resp:     18 per minute BP sitting:   122 / 80  (left arm)  Pt. in pain?   no  Vitals Entered By: Ardyth Man (September 06, 2007 11:27 AM)                  PCP:  Laury Axon  Chief Complaint:  discuss medications and get refills.  History of Present Illness: Pt here for refill on meds and she wants to change caduet back to Norvasc--- she thinks she is having SE from Lexapro andlipitor.  She stopped Lexapro few weeks ago.  Pt also still with D/C--better but not gone-- see last visit.    Current Allergies: VYTORIN  Past Medical History:    Reviewed history from 09/25/2006 and no changes required:       Diabetes mellitus, type II       Hyperlipidemia       Hypertension       Cerebrovascular accident, hx of       GERD  Past Surgical History:    Reviewed history from 04/16/2007 and no changes required:       Tonsillectomy        Tubaligation (1975)       Hysterectomy (1982)       Appendectomy (1982)       Lt Breast; benign tumor (1975)   Family History:    Reviewed history from 07/03/2006 and no changes required:       Family History of Cervical cancer       Family History Diabetes 1st degree relative       Family History Hypertension       Family History Other cancer- Breast, Prostate       Family History MI  Social History:    Reviewed history from 07/03/2006 and no changes required:       Married       Never Smoked       Alcohol use-no    Review of Systems      See HPI   Physical Exam  General:     Well-developed,well-nourished,in no acute distress; alert,appropriate and cooperative throughout examination Psych:     Oriented X3, memory intact for recent and remote, and normally interactive.      Impression & Recommendations:  Problem #  1:  VAGINITIS (ICD-616.10)  Her updated medication list for this problem includes:    Metronidazole 500 Mg Tabs (Metronidazole) .Marland Kitchen... 1 by mouth two times a day for 7 days    Gynazole-1 2 % Crea (Butoconazole nitrate (1 dose)) .Marland Kitchen... 1 applicator x1 . may repeat in 1 week as needed Discussed symptomatic relief and treatment options.   Problem # 2:  SPINAL STENOSIS, LUMBAR (ICD-724.02) pt having surgery in few weeks with Dr Franky Macho  Problem # 3:  HYPERTENSION (ICD-401.9)  Her updated medication list for this problem includes:    Norvasc 10 Mg Tabs (Amlodipine besylate) .Marland Kitchen... 1 by mouth once daily  BP today: 122/80 Prior BP: 130/70 (08/24/2007)  Labs Reviewed: Creat: 1.0 (07/17/2007) Chol: 118 (07/17/2007)   HDL: 44.3 (07/17/2007)   LDL: 57 (07/17/2007)   TG: 83 (07/17/2007)   Problem # 4:  HYPERLIPIDEMIA (ICD-272.4) hold lipitor secondary  to myalgias----rto 2-3 weeks Labs Reviewed: Chol: 118 (07/17/2007)   HDL: 44.3 (07/17/2007)   LDL: 57 (07/17/2007)   TG: 83 (07/17/2007) SGOT: 17 (07/17/2007)   SGPT: 13 (07/17/2007)   Problem # 5:  DIABETES MELLITUS, TYPE II (ICD-250.00) pt given new meter--- freestyle lite Her updated medication list for this problem includes:    Glipizide 10 Mg Tabs (Glipizide) .Marland Kitchen... 1 two times a day    Actos 15 Mg Tabs (Pioglitazone hcl) .Marland Kitchen... 1 by mouth once daily  Labs Reviewed: HgBA1c: 5.7 (07/17/2007)   Creat: 1.0 (07/17/2007)   Microalbumin: 0.5 (07/17/2007)   Complete Medication List: 1)  Glipizide 10 Mg Tabs (Glipizide) .Marland Kitchen.. 1 two times a day 2)  Freestyle Flash System Kit (Blood glucose monitoring suppl) .... Test twice everyday 3)  Aggrenox 25-200 Mg Cp12 (Aspirin-dipyridamole) .Marland Kitchen.. 1 by mouth two times a day 4)  Actos 15 Mg Tabs (Pioglitazone hcl) .Marland Kitchen.. 1 by mouth once daily 5)  Norvasc 10 Mg Tabs (Amlodipine besylate) .Marland Kitchen.. 1 by mouth once daily 6)  Free Style Strips  7)  Kapidex 30mg   .... 1 by mouth once daily 8)  Gabapentin 100 Mg Caps  (Gabapentin) .Marland Kitchen.. 1 q 8 hrs as needed leg pain 9)  Darvocet-n 100 100-650 Mg Tabs (Propoxyphene n-apap) .Marland Kitchen.. 1 by mouth every 6 hours as needed 10)  Fluconazole 150 Mg Tabs (Fluconazole) .Marland Kitchen.. 1 by mouth once daily x1 may repeat in 1 week as needed 11)  Metronidazole 500 Mg Tabs (Metronidazole) .Marland Kitchen.. 1 by mouth two times a day for 7 days 12)  Gynazole-1 2 % Crea (Butoconazole nitrate (1 dose)) .Marland Kitchen.. 1 applicator x1 . may repeat in 1 week as needed   Patient Instructions: 1)  30 min appointment in 3 weeks   Prescriptions: NORVASC 10 MG  TABS (AMLODIPINE BESYLATE) 1 by mouth once daily  #30 x 2   Entered and Authorized by:   Loreen Freud DO   Signed by:   Loreen Freud DO on 09/06/2007   Method used:   [facility] ...       [facility]  [address] [id number]       [address]       [place], [place]  [address]       Ph: [phone] or [phone]       Fax: [phone]   RxID:   [id number] GYNAZOLE-1 2 %  CREA (BUTOCONAZOLE NITRATE (1 DOSE)) 1 applicator x1 . May repeat in 1 week as needed  #2 x 0   Entered and Authorized by:   Loreen Freud DO   Signed by:   Loreen Freud DO on 09/06/2007   Method used:   Electronically sent to ...       [facility]  [address] [id number]       [address]       [place], [place]  [address]       Ph: [phone] or [phone]       Fax: [phone]   RxID:   [id number] METRONIDAZOLE 500 MG  TABS (METRONIDAZOLE) 1 by mouth two times a day for 7 days  #14 x 0   Entered and Authorized by:   Loreen Freud DO   Signed by:   Loreen Freud DO on 09/06/2007   Method used:   Electronically sent to ...       [facility]  [address] [id number]       [address]       [place], [place]  [address]       Ph: [phone] or [phone]       Fax: [phone]   RxID:   [id number]  ]

## 2010-03-02 NOTE — Consult Note (Signed)
Summary: Nita Sells MD Dermatology  Nita Sells MD Dermatology   Imported By: Lanelle Bal 02/25/2009 12:21:32  _____________________________________________________________________  External Attachment:    Type:   Image     Comment:   External Document

## 2010-03-02 NOTE — Letter (Signed)
Summary: Results Follow-up Letter  [place] [facility]  [address] [place], [place] [address]   Phone: [phone]  Fax: [phone]    01/28/2008        [name] [address]  [place], [place]  [address]  Dear Ms. Solar,   The following are the results of your recent test(s):  Test     Result     Pap Smear    Normal_______  Not Normal_____       Comments: _________________________________________________________ Cholesterol LDL(Bad cholesterol):          Your goal is less than:         HDL (Good cholesterol):        Your goal is more than: _________________________________________________________ Other Tests:   _________________________________________________________  Please call for an appointment Or Please see attached labs._________________________________________________________ _________________________________________________________ _________________________________________________________  Sincerely,  Felecia Deloach CMA [place] [facility]

## 2010-03-02 NOTE — Progress Notes (Signed)
Summary: Medication Change Request  Phone Note Call from Patient Call back at Home Phone [phone]   Caller: Patient Summary of Call: PT WALK IN AND ASK FOR THE GENERIC FOR CYMBALTA-- [facility]  GAVE HER CITALOPRAM,VENLAFAXINE,LEXAPRO AND SHE IS OUT OF THE CYMBALTA AND WOULD LIKE SAMPLE.BUT NEED A NEW SCRIPT TO BE SENT TO [facility] FOR HER. Initial call taken by: Freddy Jaksch,  March 12, 2009 11:11 AM  Follow-up for Phone Call        Cymbalta [facility] [address]- okay to send to [facility]? Army Fossa CMA  March 12, 2009 12:11 PM   Additional Follow-up for Phone Call Additional follow up Details #1::        Let her know there is no generic for cymbalta-- we would have to wean her off and change to one of the others if that is what she wants to do. Additional Follow-up by: Loreen Freud DO,  March 12, 2009 1:10 PM    Additional Follow-up for Phone Call Additional follow up Details #2::    LMTCB. Army Fossa CMA  March 12, 2009 1:16 PM  Spoke with patient: Patient only has 4 days of med left and will need some samples. Placed 3 sample bottles at the front for pick-up next week. Per Dr.Lowne instruction to D/c is as follows: 1 once daily x 2 weeks then 1/2 tab x 2 weeks and then D/C.   Patient will need Dr.Lowne to select a alternate med and have nurse send in to [facility].      Follow-up by: Shonna Chock,  March 13, 2009 8:45 AM  Additional Follow-up for Phone Call Additional follow up Details #3:: Details for Additional Follow-up Action Taken: cymbalta are capsules ---- she can not break them.    Is there an alternative medication for her? Army Fossa CMA  March 13, 2009 10:13 AM  We need to wean her off---- 1 , 60 mg a day for 2 weeks then she needs 30 mg a day for 2 weeks---then we can start an alternative---citalopram 20 mg  #90  3 refills  1 by mouth once daily    Pt is aware.  Army Fossa CMA  March 13, 2009 11:44 AM  Additional Follow-up by: Loreen Freud DO,  March 13, 2009 10:11 AM  New/Updated Medications: CITALOPRAM HYDROBROMIDE 20 MG TABS (CITALOPRAM HYDROBROMIDE) 1 by mouth once daily. Prescriptions: CITALOPRAM HYDROBROMIDE 20 MG TABS (CITALOPRAM HYDROBROMIDE) 1 by mouth once daily.  #90 x 3   Entered by:   Army Fossa CMA   Authorized by:   Loreen Freud DO   Signed by:   Army Fossa CMA on 03/13/2009   Method used:   Electronically to        [facility] (mail-order)       [address]       [place], [place]  [address]       Ph: [phone]       Fax: [phone]   RxID:   [id number]

## 2010-03-02 NOTE — Miscellaneous (Signed)
  Medications Added DEXILANT 60 MG CPDR (DEXLANSOPRAZOLE) 1 by mouth once daily.       Clinical Lists Changes  Medications: Changed medication from DEXILANT 60 MG CPDR (DEXLANSOPRAZOLE) 1 by mouth two times a day to DEXILANT 60 MG CPDR (DEXLANSOPRAZOLE) 1 by mouth once daily.

## 2010-03-02 NOTE — Progress Notes (Signed)
Summary: RX  Phone Note Call from Patient Call back at [facility] Phone [phone]   Caller: Patient Summary of Call: PATIENT CAME IN FOR NEW SCRIPT FOR METRONIDAZOLE VAGINAL GEL [facility] [address] AND A NEW SCRIPT FOR CYMBALATA 60 MG FOR 90 DAYS FOR [facility]--NEED SAMPLE OF CYMBALATA TOO. Initial call taken by: Freddy Jaksch,  Jun 02, 2009 10:40 AM  Follow-up for Phone Call        we usually do not refill metrogel---what is the problem? we had stopped cymbalta secondary to price and put her on citalopram---did she stop citalopram?  if she is off citalopram we can leave samples of cymbalta  30 mg for 7 days then 60 mg daily. Follow-up by: Loreen Freud DO,  Jun 02, 2009 10:59 AM  Additional Follow-up for Phone Call Additional follow up Details #1::        I left message for pt to call back. She came in friday and stated that she was taking the Cymbalta, she had never stopped. We gave her 2 weeks of samples then. Army Fossa CMA  Jun 02, 2009 11:02 AM      Additional Follow-up for Phone Call Additional follow up Details #2::    ok to leave samples of cymbalta----  she should not be taking citalopram also  LMTCB. Army Fossa CMA  Jun 02, 2009 11:59 AM  Follow-up by: Loreen Freud DO,  Jun 02, 2009 11:53 AM  Additional Follow-up for Phone Call Additional follow up Details #3:: Details for Additional Follow-up Action Taken: [facility]. Army Fossa CMA  Jun 02, 2009 2:33 PM  Pt states she will stop the citalopram, she needs the Metrogel because she is having a discharge. She had some left over so she started using it but ran out. I put samples upfront of cymbalta. Army Fossa CMA  Jun 03, 2009 10:48 AM  ok to call in metrogel 1 appl pv at bedtime for 5 nights but she needs ov if it does not clear up  yrlowne 06/03/2009 1130a  Pt is aware. Army Fossa CMA  Jun 03, 2009 11:38 AM   New/Updated Medications: METROGEL-VAGINAL 0.75 % GEL  (METRONIDAZOLE) 1 appl pv at bedtime for 5 nights. Prescriptions: METROGEL-VAGINAL 0.75 % GEL (METRONIDAZOLE) 1 appl pv at bedtime for 5 nights.  #1 x 0   Entered by:   Army Fossa CMA   Authorized by:   Loreen Freud DO   Signed by:   Army Fossa CMA on 06/03/2009   Method used:   [facility]        [facility]  [address] [phone][facility] (retail)       [address]       [place], [place]  [address]       Ph: [phone]       Fax: [phone]   RxID:   [id number]

## 2010-03-02 NOTE — Progress Notes (Signed)
Summary: RX  Phone Note Call from Patient Call back at Home Phone [phone]   Reason for Call: Refill Medication Summary of Call: PATIENT WALK IN WANTING A REFILL ON HER FENTANYL 50 MG TO BE PICK UP TODAY IF POSSIBLE Initial call taken by: Freddy Jaksch,  July 22, 2009 11:17 AM  Follow-up for Phone Call        Pt is in the lobby waiting- it was last refilled on 06/08/09. Army Fossa CMA  July 22, 2009 11:19 AM   Additional Follow-up for Phone Call Additional follow up Details #1::        per dr Laury Axon okay to fill. Army Fossa CMA  July 22, 2009 11:24 AM     Additional Follow-up for Phone Call Additional follow up Details #2::    done Follow-up by: Loreen Freud DO,  July 22, 2009 11:42 AM  Prescriptions: DURAGESIC-50 50 MCG/HR PT72 (FENTANYL) 1 patch every 72 hours  #10 x 0   Entered by:   Army Fossa CMA   Authorized by:   Loreen Freud DO   Signed by:   Army Fossa CMA on 07/22/2009   Method used:   Print then Give to Patient   RxID:   [id number]

## 2010-03-02 NOTE — Assessment & Plan Note (Signed)
Summary: discharge for weeks////sph   Vital Signs:  Patient profile:   70 year old female Height:      65.25 inches Weight:      172 pounds Temp:     98.5 degrees F oral Pulse rate:   68 / minute BP sitting:   140 / 80  (left arm)  Vitals Entered By: Jeremy Johann CMA (August 14, 2009 2:04 PM) CC: vaginal discharge, odor, and itching, refills, Vaginal discharge   History of Present Illness:  Vaginal discharge      This is a 70 year old woman who presents with Vaginal discharge.  The symptoms began 3 months ago.  The patient complains of itching and vaginal burning.  The discharge is described as white, yellow, and malodorous.  Risk factors for vaginal discharge include recent douching and diabetes.  The patient denies the following symptoms: genital sores, unusual vaginal bleeding, painful intercourse, rash, myalgias, arthralgias, and headache.  Prior treatment has included OTC antifungals, prescription antifungals, and metronidazole.  This all started after cath done in ER for urine specimen.                      Current Medications (verified): 1)  Glipizide 10 Mg Tabs (Glipizide) .Marland Kitchen.. 1 Two Times A Day 2)  Aggrenox 25-200 Mg  Cp12 (Aspirin-Dipyridamole) .Marland Kitchen.. 1 By Mouth Two Times A Day 3)  Klor-Con M20 20 Meq Cr-Tabs (Potassium Chloride Crys Cr) .... Take 1 Tab Two Times A Day 4)  Diovan Hct 320-25 Mg Tabs (Valsartan-Hydrochlorothiazide) .Marland Kitchen.. 1 By Mouth Once Daily 5)  Duragesic-50 50 Mcg/hr Pt72 (Fentanyl) .Marland Kitchen.. 1 Patch Every 72 Hours 6)  Lipitor 20 Mg Tabs (Atorvastatin Calcium) .Marland Kitchen.. 1 By Mouth At Bedtime 7)  Cymbalta 60 Mg Cpep (Duloxetine Hcl) .... 2  By Mouth Daily For Depression and Pain 8)  Ferrous Sulfate 325 (65 Fe) Mg Tabs (Ferrous Sulfate) .Marland Kitchen.. 1 By Mouth Once Daily 9)  Stool Softener 100 Mg Caps (Docusate Sodium) .... Take By Mouth Once Daily As Needed 10)  Nexium 40 Mg Cpdr (Esomeprazole Magnesium) .... One Tablet By Mouth Two Times A Day  Allergies: 1)  Vytorin  Past  History:  Past medical, surgical, family and social histories (including risk factors) reviewed for relevance to current acute and chronic problems.  Past Medical History: Reviewed history from 06/25/2009 and no changes required. Diabetes mellitus, type II Hyperlipidemia Hypertension Cerebrovascular accident, hx of GERD CHRONIC BACK PAIN Anxiety Disorder Depression Allergies Hiatal hernia Hemorrhoids Delayed Gastric Empyting  Past Surgical History: Reviewed history from 04/16/2007 and no changes required. Tonsillectomy  Tubaligation (1975) Hysterectomy (1610) Appendectomy (1982) Lt Breast; benign tumor (1975)  Family History: Reviewed history from 06/30/2009 and no changes required. Family History of Cervical cancer Family History Diabetes 1st degree relative Family History Hypertension Family History Other cancer- Breast, Prostate Family History MI No FH of Colon Cancer:  Social History: Reviewed history from 06/30/2009 and no changes required. Married Never Smoked Alcohol use-no Patient has never smoked.  Illicit Drug Use - no Daily Caffeine Use  Review of Systems      See HPI  Physical Exam  General:  Well-developed,well-nourished,in no acute distress; alert,appropriate and cooperative throughout examination Genitalia:  + white d/c  + odor normal introitus, no external lesions, and no vaginal or cervical lesions.     Impression & Recommendations:  Problem # 1:  VAGINAL DISCHARGE (ICD-623.5)  if it does not resolve and tests all normal---refer to gyn--pt aware and agrees  lotrisone sent to pharm for vaginal itchin Orders: UA Dipstick w/o Micro (manual) ([address]109) T-Chlamydia  Probe, urine (60454-09811) T-GC Probe, urine (91478-29562) TLB-Wet [place] / Fungus (87210-WPREP)  Discussed medication use and symptom relief.   Complete Medication List: 1)  Glipizide 10 Mg Tabs (Glipizide) .Marland Kitchen.. 1 two times a day 2)  Aggrenox 25-200 Mg Cp12  (Aspirin-dipyridamole) .Marland Kitchen.. 1 by mouth two times a day 3)  Klor-con M20 20 Meq Cr-tabs (Potassium chloride crys cr) .... Take 1 tab two times a day 4)  Diovan Hct 320-25 Mg Tabs (Valsartan-hydrochlorothiazide) .Marland Kitchen.. 1 by mouth once daily 5)  Duragesic-50 50 Mcg/hr Pt72 (Fentanyl) .Marland Kitchen.. 1 patch every 72 hours 6)  Lipitor 20 Mg Tabs (Atorvastatin calcium) .Marland Kitchen.. 1 by mouth at bedtime 7)  Cymbalta 60 Mg Cpep (Duloxetine hcl) .... 2  by mouth daily for depression and pain 8)  Ferrous Sulfate 325 (65 Fe) Mg Tabs (Ferrous sulfate) .Marland Kitchen.. 1 by mouth once daily 9)  Stool Softener 100 Mg Caps (Docusate sodium) .... Take by mouth once daily as needed 10)  Nexium 40 Mg Cpdr (Esomeprazole magnesium) .... One tablet by mouth two times a day 11)  Lotrisone 1-0.05 % Crea (Clotrimazole-betamethasone) .... Apply two times a day Prescriptions: LOTRISONE 1-0.05 % CREA (CLOTRIMAZOLE-BETAMETHASONE) apply two times a day  #30g x 1   Entered and Authorized by:   Loreen Freud DO   Signed by:   Loreen Freud DO on 08/14/2009   Method used:   Electronically to        [facility] [address] [facility] (retail)       [address]       [place], [place]  [address]       Ph: [phone]       Fax: [phone]   RxID:   [id number] NEXIUM 40 MG CPDR (ESOMEPRAZOLE MAGNESIUM) one tablet by mouth two times a day  #180 x 3   Entered and Authorized by:   Loreen Freud DO   Signed by:   Loreen Freud DO on 08/14/2009   Method used:   Electronically to        [facility]* (mail-order)       [address]       [place], [place]  [address]       Ph: [phone]       Fax: [phone]   RxID:   [id number] DURAGESIC-50 50 MCG/HR PT72 (FENTANYL) 1 patch every 72 hours  #10 x 0   Entered and Authorized by:   Loreen Freud DO   Signed by:   Loreen Freud DO on 08/14/2009   Method used:   Print then Give to Patient   RxID:   [id number]   Appended Document: discharge for  weeks////sph

## 2010-03-02 NOTE — Consult Note (Signed)
Summary: [facility]   Imported By: Lanelle Bal 08/11/2009 10:18:19  _____________________________________________________________________  External Attachment:    Type:   Image     Comment:   External Document

## 2010-03-02 NOTE — Progress Notes (Signed)
Summary: Ins no longer covering cymbalta  Phone Note Call from Patient   Summary of Call: Pt called and stated her insurance company will no longer cover her Cymbalta. I called the pt and asked her to bring in a copy of her formulary and we could review it and see what medications they will cover in place of cymbalta. She agreed to doing this. Army Fossa CMA  February 17, 2009 1:15 PM

## 2010-03-02 NOTE — Letter (Signed)
Summary: Unable To Reach-Consult Scheduled  [facility] [place]  [address] [place], [place] [address]   Phone: [phone]  Fax: [phone]    07/17/2009 MRN: [id number]    Dear Ms. Shenefield,   We have been unable to reach you by phone.  Please contact our office with an updated phone number.     Thank you,  Army Fossa CMA  July 17, 2009 8:50 AM

## 2010-03-02 NOTE — Assessment & Plan Note (Signed)
Summary: DISCUSS MEDS/RESULTS FROM MAMMOGRAM/KDC   Vital Signs:  Patient profile:   70 year old female Weight:      171 pounds Temp:     98.1 degrees F oral Pulse rate:   88 / minute Pulse rhythm:   regular BP sitting:   122 / 86  (left arm) Cuff size:   regular  Vitals Entered By: Army Fossa CMA (February 20, 2009 3:29 PM)   History of Present Illness: Pt here to discuss meds and mammogram.  Pt with pain in R breast and L arm.  Pt concerned about breast.  Pt also c/o small lump R side of neck and difficulty swallowing.   She is also c/o increased depression.  The cymbalta is helping with pain but not depression or anxiety.    Current Medications (verified): 1)  Glipizide 10 Mg Tabs (Glipizide) .Marland Kitchen.. 1 Two Times A Day 2)  Freestyle Flash System   Kit (Blood Glucose Monitoring Suppl) .... Test Twice Everyday 3)  Aggrenox 25-200 Mg  Cp12 (Aspirin-Dipyridamole) .Marland Kitchen.. 1 By Mouth Two Times A Day 4)  Freestyle Lite Test  Strp (Glucose Blood) .... Use Two Strips Daily As Directed. 5)  Klor-Con M20 20 Meq Cr-Tabs (Potassium Chloride Crys Cr) .... Take 1 Tab Once Daily 6)  Diovan Hct 320-25 Mg Tabs (Valsartan-Hydrochlorothiazide) .Marland Kitchen.. 1 By Mouth Once Daily 7)  Ativan 0.5 Mg Tabs (Lorazepam) .Marland Kitchen.. 1 By Mouth Three Times A Day As Needed 8)  Duragesic-25 25 Mcg/hr Pt72 (Fentanyl) .Marland Kitchen.. 1 Every 72 Hours 9)  Cymbalta 60 Mg Cpep (Duloxetine Hcl) .Marland Kitchen.. 1 By Mouth Two Times A Day 10)  Lipitor 20 Mg Tabs (Atorvastatin Calcium) .Marland Kitchen.. 1 By Mouth At Bedtime 11)  Dexilant 30 Mg Cpdr (Dexlansoprazole) .Marland Kitchen.. 1 By Mouth Once Daily 12)  Klonopin 0.5 Mg Tabs (Clonazepam) .Marland Kitchen.. 1 By Mouth Two Times A Day  Allergies: 1)  Vytorin  Past History:  Past medical, surgical, family and social histories (including risk factors) reviewed for relevance to current acute and chronic problems.  Past Medical History: Reviewed history from 06/23/2008 and no changes required. Diabetes mellitus, type  II Hyperlipidemia Hypertension Cerebrovascular accident, hx of GERD CHRONIC BACK PAIN  Past Surgical History: Reviewed history from 04/16/2007 and no changes required. Tonsillectomy  Tubaligation (1975) Hysterectomy (1610) Appendectomy (1982) Lt Breast; benign tumor (1975)  Family History: Reviewed history from 07/03/2006 and no changes required. Family History of Cervical cancer Family History Diabetes 1st degree relative Family History Hypertension Family History Other cancer- Breast, Prostate Family History MI  Social History: Reviewed history from 06/23/2008 and no changes required. Married Never Smoked Alcohol use-no Patient has never smoked.  Illicit Drug Use - no  Review of Systems      See HPI  Physical Exam  General:  Well-developed,well-nourished,in no acute distress; alert,appropriate and cooperative throughout examination Ears:  External ear exam shows no significant lesions or deformities.  Otoscopic examination reveals clear canals, tympanic membranes are intact bilaterally without bulging, retraction, inflammation or discharge. Hearing is grossly normal bilaterally. Nose:  External nasal examination shows no deformity or inflammation. Nasal mucosa are pink and moist without lesions or exudates. Mouth:  Oral mucosa and oropharynx without lesions or exudates.  Teeth in good repair. Neck:  Small lump R side neck-- soft mobile  Lungs:  Normal respiratory effort, chest expands symmetrically. Lungs are clear to auscultation, no crackles or wheezes. Heart:  normal rate.   Skin:  Intact without suspicious lesions or rashes Psych:  Oriented X3, good  eye contact, and depressed affect.     Impression & Recommendations:  Problem # 1:  NECK MASS (ICD-784.2) ? thyroid  Orders: Radiology Referral (Radiology) EKG w/ Interpretation (93000) check labs  Problem # 2:  CHRONIC PAIN SYNDROME (ICD-338.4)  cont meds  Orders: EKG w/ Interpretation  (93000)  Problem # 3:  DEPRESSION (ICD-311)  Her updated medication list for this problem includes:    Ativan 0.5 Mg Tabs (Lorazepam) .Marland Kitchen... 1 by mouth three times a day as needed    Cymbalta 60 Mg Cpep (Duloxetine hcl) .Marland Kitchen... 1 by mouth two times a day    Klonopin 0.5 Mg Tabs (Clonazepam) .Marland Kitchen... 1 by mouth two times a day  Orders: Venipuncture (65784) TLB-BMP (Basic Metabolic Panel-BMET) (80048-METABOL) TLB-CBC Platelet - w/Differential (85025-CBCD) TLB-Hepatic/Liver Function Pnl (80076-HEPATIC) TLB-TSH (Thyroid Stimulating Hormone) (84443-TSH) TLB-T3, Free (Triiodothyronine) (84481-T3FREE) TLB-T4 (Thyrox), Free [phone]) EKG w/ Interpretation (93000)  Discussed treatment options, including trial of antidpressant medication. Will refer to behavioral health. Follow-up call in in 24-48 hours and recheck in 2 weeks, sooner as needed. Patient agrees to call if any worsening of symptoms or thoughts of doing harm arise. Verified that the patient has no suicidal ideation at this time.   Problem # 4:  UNSPECIFIED ANEMIA (ICD-285.9)  Orders: Venipuncture (13244) TLB-BMP (Basic Metabolic Panel-BMET) (80048-METABOL) TLB-CBC Platelet - w/Differential (85025-CBCD) TLB-Hepatic/Liver Function Pnl (80076-HEPATIC) TLB-TSH (Thyroid Stimulating Hormone) (84443-TSH) TLB-T3, Free (Triiodothyronine) (84481-T3FREE) TLB-T4 (Thyrox), Free [phone]) EKG w/ Interpretation (93000)  Hgb: 10.8 (01/05/2009)   Hct: 32.5 (01/05/2009)   Platelets: 201.0 (01/05/2009) RBC: 3.43 (01/05/2009)   RDW: 12.9 (01/05/2009)   WBC: 8.8 (01/05/2009) MCV: 94.8 (01/05/2009)   MCHC: 33.0 (01/05/2009) Ferritin: 29.6 (05/02/2007) Iron: 81 (05/02/2007)   % Sat: 22.3 (05/02/2007) B12: 877 (05/02/2007)   Folate: 18.1 (05/02/2007)   TSH: 1.37 (07/17/2007)  Problem # 5:  ANXIETY STATE NOS (ICD-300.00)  Her updated medication list for this problem includes:    Ativan 0.5 Mg Tabs (Lorazepam) .Marland Kitchen... 1 by mouth three times a day  as needed    Cymbalta 60 Mg Cpep (Duloxetine hcl) .Marland Kitchen... 1 by mouth two times a day    Klonopin 0.5 Mg Tabs (Clonazepam) .Marland Kitchen... 1 by mouth two times a day  Orders: Venipuncture (64403) TLB-BMP (Basic Metabolic Panel-BMET) (80048-METABOL) TLB-CBC Platelet - w/Differential (85025-CBCD) TLB-Hepatic/Liver Function Pnl (80076-HEPATIC) TLB-TSH (Thyroid Stimulating Hormone) (84443-TSH) TLB-T3, Free (Triiodothyronine) (84481-T3FREE) TLB-T4 (Thyrox), Free (47425-ZD6L) EKG w/ Interpretation (93000)  Discussed medication use and relaxation techniques.   Complete Medication List: 1)  Glipizide 10 Mg Tabs (Glipizide) .Marland Kitchen.. 1 two times a day 2)  Freestyle Flash System Kit (Blood glucose monitoring suppl) .... Test twice everyday 3)  Aggrenox 25-200 Mg Cp12 (Aspirin-dipyridamole) .Marland Kitchen.. 1 by mouth two times a day 4)  Freestyle Lite Test Strp (Glucose blood) .... Use two strips daily as directed. 5)  Klor-con M20 20 Meq Cr-tabs (Potassium chloride crys cr) .... Take 1 tab once daily 6)  Diovan Hct 320-25 Mg Tabs (Valsartan-hydrochlorothiazide) .Marland Kitchen.. 1 by mouth once daily 7)  Ativan 0.5 Mg Tabs (Lorazepam) .Marland Kitchen.. 1 by mouth three times a day as needed 8)  Duragesic-25 25 Mcg/hr Pt72 (Fentanyl) .Marland Kitchen.. 1 every 72 hours 9)  Cymbalta 60 Mg Cpep (Duloxetine hcl) .Marland Kitchen.. 1 by mouth two times a day 10)  Lipitor 20 Mg Tabs (Atorvastatin calcium) .Marland Kitchen.. 1 by mouth at bedtime 11)  Dexilant 30 Mg Cpdr (Dexlansoprazole) .Marland Kitchen.. 1 by mouth once daily 12)  Klonopin 0.5 Mg Tabs (Clonazepam) .Marland Kitchen.. 1 by mouth  two times a day    Prescriptions: KLONOPIN 0.5 MG TABS (CLONAZEPAM) 1 by mouth two times a day  #60 x 0   Entered and Authorized by:   Loreen Freud DO   Signed by:   Loreen Freud DO on 02/20/2009   Method used:   Print then Give to Patient   RxID:   [id number] CYMBALTA 60 MG CPEP (DULOXETINE HCL) 1 by mouth two times a day  #60 x 0   Entered and Authorized by:   Loreen Freud DO   Signed by:   Loreen Freud DO on  02/20/2009   Method used:   Historical   RxID:   [id number]    EKG  Procedure date:  02/20/2009  Findings:      Normal sinus rhythm with rate of:  66

## 2010-03-02 NOTE — Letter (Signed)
Summary: [facility]   Imported By: Lanelle Bal 04/07/2009 12:48:09  _____________________________________________________________________  External Attachment:    Type:   Image     Comment:   External Document

## 2010-03-02 NOTE — Progress Notes (Signed)
Summary: NEEDS FIVE 90 DAY PRESCRIPTIONS WITH REFILLS  Phone Note Call from Patient Call back at Home Phone [phone]   Caller: Patient Summary of Call: [facility] NEEDS FOLLOWING FIVE MEDICATIONS CALLED INTO [facility] FOR 90 DAYS PLUS REFILLS -------(ADVISED PATIENT TO HAVE [facility] CALL us WITH REFILLS NEXT TIME)  1)  GLIPISIDE 2)  POTASSIUM 3)  DIOVAN 4)  DEXILANT 5)  LIPITOR  PLEASE CALL HR WHEN [facility] HAVE BEEN CALLED IN Initial call taken by: Jerolyn Shin,  Jun 16, 2009 11:44 AM    Prescriptions: DEXILANT 60 MG CPDR (DEXLANSOPRAZOLE) 1 by mouth two times a day  #180 x 1   Entered by:   Army Fossa CMA   Authorized by:   Loreen Freud DO   Signed by:   Army Fossa CMA on 06/16/2009   Method used:   Electronically to        [facility]* (mail-order)       [address]       [place], [place]  [address]       Ph: [phone]       Fax: [phone]   RxID:   [id number] LIPITOR 20 MG TABS (ATORVASTATIN CALCIUM) 1 by mouth at bedtime  #90 x 1   Entered by:   Army Fossa CMA   Authorized by:   Loreen Freud DO   Signed by:   Army Fossa CMA on 06/16/2009   Method used:   Electronically to        [facility] (mail-order)       [address]       [place], [place]  [address]       Ph: [phone]       Fax: [phone]   RxID:   [id number] DIOVAN HCT 320-25 MG TABS (VALSARTAN-HYDROCHLOROTHIAZIDE) 1 by mouth once daily  #90 x 1   Entered by:   Army Fossa CMA   Authorized by:   Loreen Freud DO   Signed by:   Army Fossa CMA on 06/16/2009   Method used:   Electronically to        [facility]* (mail-order)       [address]       [place], [place]  [address]       Ph: [phone]       Fax: [phone]   RxID:   [id number] KLOR-CON M20 20 MEQ CR-TABS (POTASSIUM CHLORIDE CRYS CR) take 1 tab two times a day  #180 x 1   Entered by:   Army Fossa CMA   Authorized by:   Loreen Freud DO   Signed by:   Army Fossa CMA on 06/16/2009   Method used:   Electronically to        [facility] ([facility]-order)       [address]       [place], [place]  [address]       Ph: [phone]       Fax: [phone]   RxID:   [id number] GLIPIZIDE 10 MG TABS (GLIPIZIDE) 1 two times a day  #180 x 1   Entered by:   Army Fossa CMA   Authorized by:   Loreen Freud DO   Signed by:   Army Fossa CMA on 06/16/2009   Method used:   Electronically to        [facility]* ([facility]-order)       [address]  [place], [place]  [address]       Ph: [phone]       Fax: [phone]   RxID:   [id number]

## 2010-03-02 NOTE — Assessment & Plan Note (Signed)
Summary: discuss meds, pain in hand//lch   Vital Signs:  Patient profile:   70 year old female Height:      65.25 inches Weight:      172 pounds BMI:     28.51 Temp:     98.5 degrees F oral Pulse rate:   80 / minute Pulse rhythm:   regular BP sitting:   122 / 84  (left arm) Cuff size:   regular  Vitals Entered By: Army Fossa CMA (March 26, 2009 9:42 AM) CC: Pt would like to discuss meds, having some pain in left hand since she broke her arm.   History of Present Illness: Pt here for med refills and c/o difficulty taking and con't pain in L arm since fall in July.  Pt concerned that she had a stroke then.   No other complaints.     Current Medications (verified): 1)  Glipizide 10 Mg Tabs (Glipizide) .Marland Kitchen.. 1 Two Times A Day 2)  Freestyle Flash System   Kit (Blood Glucose Monitoring Suppl) .... Test Twice Everyday 3)  Aggrenox 25-200 Mg  Cp12 (Aspirin-Dipyridamole) .Marland Kitchen.. 1 By Mouth Two Times A Day 4)  Freestyle Lite Test  Strp (Glucose Blood) .... Use Two Strips Daily As Directed. 5)  Klor-Con M20 20 Meq Cr-Tabs (Potassium Chloride Crys Cr) .... Take 1 Tab Two Times A Day 6)  Diovan Hct 320-25 Mg Tabs (Valsartan-Hydrochlorothiazide) .Marland Kitchen.. 1 By Mouth Once Daily 7)  Ativan 0.5 Mg Tabs (Lorazepam) .Marland Kitchen.. 1 By Mouth Three Times A Day As Needed 8)  Duragesic-50 50 Mcg/hr Pt72 (Fentanyl) .Marland Kitchen.. 1 Patch Q72 Hours 9)  Cymbalta 60 Mg Cpep (Duloxetine Hcl) .Marland Kitchen.. 1 By Mouth Two Times A Day 10)  Lipitor 20 Mg Tabs (Atorvastatin Calcium) .Marland Kitchen.. 1 By Mouth At Bedtime 11)  Dexilant 30 Mg Cpdr (Dexlansoprazole) .Marland Kitchen.. 1 By Mouth Once Daily 12)  Klonopin 0.5 Mg Tabs (Clonazepam) .Marland Kitchen.. 1 By Mouth Two Times A Day 13)  Slow Fe 160 (50 Fe) Mg Cr-Tabs (Ferrous Sulfate Dried) 14)  Citalopram Hydrobromide 20 Mg Tabs (Citalopram Hydrobromide) .Marland Kitchen.. 1 By Mouth Once Daily.  Allergies: 1)  Vytorin  Past History:  Past medical, surgical, family and social histories (including risk factors) reviewed for  relevance to current acute and chronic problems.  Past Medical History: Reviewed history from 06/23/2008 and no changes required. Diabetes mellitus, type II Hyperlipidemia Hypertension Cerebrovascular accident, hx of GERD CHRONIC BACK PAIN  Past Surgical History: Reviewed history from 04/16/2007 and no changes required. Tonsillectomy  Tubaligation (1975) Hysterectomy (6045) Appendectomy (1982) Lt Breast; benign tumor (1975)  Family History: Reviewed history from 07/03/2006 and no changes required. Family History of Cervical cancer Family History Diabetes 1st degree relative Family History Hypertension Family History Other cancer- Breast, Prostate Family History MI  Social History: Reviewed history from 06/23/2008 and no changes required. Married Never Smoked Alcohol use-no Patient has never smoked.  Illicit Drug Use - no  Review of Systems      See HPI  Physical Exam  General:  Well-developed,well-nourished,in no acute distress; alert,appropriate and cooperative throughout examination Lungs:  Normal respiratory effort, chest expands symmetrically. Lungs are clear to auscultation, no crackles or wheezes. Neurologic:  + weakness in L leg-- no change from previous + weakness in L shoulder --- secondary to arthritis --no different Psych:  Oriented X3 and normally interactive.     Impression & Recommendations:  Problem # 1:  FALL, HX OF (ICD-V15.88)  Orders: Radiology Referral (Radiology)  Problem # 2:  CHRONIC  PAIN SYNDROME (ICD-338.4)  Problem # 3:  ANEMIA (ICD-285.9)  Her updated medication list for this problem includes:    Slow Fe 160 (50 Fe) Mg Cr-tabs (Ferrous sulfate dried)  Orders: Venipuncture ([address]109) TLB-B12 + Folate Pnl (60454_09811-B14/NWG) TLB-IBC Pnl (Iron/FE;Transferrin) (83550-IBC) TLB-CBC Platelet - w/Differential (85025-CBCD) TLB-BMP (Basic Metabolic Panel-BMET) (80048-METABOL)  Problem # 4:  SPINAL STENOSIS, LUMBAR  (ICD-724.02)  Problem # 5:  ANXIETY STATE NOS (ICD-300.00)  The following medications were removed from the medication list:    Cymbalta 60 Mg Cpep (Duloxetine hcl) .Marland Kitchen... 1 by mouth two times a day Her updated medication list for this problem includes:    Ativan 0.5 Mg Tabs (Lorazepam) .Marland Kitchen... 1 by mouth three times a day as needed    Klonopin 0.5 Mg Tabs (Clonazepam) .Marland Kitchen... 1 by mouth two times a day    Citalopram Hydrobromide 20 Mg Tabs (Citalopram hydrobromide) .Marland Kitchen... 1 by mouth once daily.  Problem # 6:  HAND PAIN, LEFT (ICD-729.5) pt to f/u ortho  Complete Medication List: 1)  Glipizide 10 Mg Tabs (Glipizide) .Marland Kitchen.. 1 two times a day 2)  Freestyle Flash System Kit (Blood glucose monitoring suppl) .... Test twice everyday 3)  Aggrenox 25-200 Mg Cp12 (Aspirin-dipyridamole) .Marland Kitchen.. 1 by mouth two times a day 4)  Freestyle Lite Test Strp (Glucose blood) .... Use two strips daily as directed. 5)  Klor-con M20 20 Meq Cr-tabs (Potassium chloride crys cr) .... Take 1 tab two times a day 6)  Diovan Hct 320-25 Mg Tabs (Valsartan-hydrochlorothiazide) .Marland Kitchen.. 1 by mouth once daily 7)  Ativan 0.5 Mg Tabs (Lorazepam) .Marland Kitchen.. 1 by mouth three times a day as needed 8)  Duragesic-50 50 Mcg/hr Pt72 (Fentanyl) .Marland Kitchen.. 1 patch q72 hours 9)  Lipitor 20 Mg Tabs (Atorvastatin calcium) .Marland Kitchen.. 1 by mouth at bedtime 10)  Dexilant 30 Mg Cpdr (Dexlansoprazole) .Marland Kitchen.. 1 by mouth once daily 11)  Klonopin 0.5 Mg Tabs (Clonazepam) .Marland Kitchen.. 1 by mouth two times a day 12)  Slow Fe 160 (50 Fe) Mg Cr-tabs (Ferrous sulfate dried) 13)  Citalopram Hydrobromide 20 Mg Tabs (Citalopram hydrobromide) .Marland Kitchen.. 1 by mouth once daily.  Other Orders: Ophthalmology Referral (Ophthalmology) Prescriptions: DURAGESIC-50 50 MCG/HR PT72 (FENTANYL) 1 patch q72 hours  #10 x 0   Entered and Authorized by:   Loreen Freud DO   Signed by:   Loreen Freud DO on 03/26/2009   Method used:   Print then Give to Patient   RxID:   [id number] LIPITOR 20 MG TABS  (ATORVASTATIN CALCIUM) 1 by mouth at bedtime  #90 x 3   Entered and Authorized by:   Loreen Freud DO   Signed by:   Loreen Freud DO on 03/26/2009   Method used:   Electronically to        [facility]* (mail-order)       [address]       [place], [place]  [address]       Ph: [phone]       Fax: [phone]   RxID:   [id number]

## 2010-03-02 NOTE — Progress Notes (Signed)
Summary: reflux   Phone Note Call from Patient   Call For: Dr Russella Dar Summary of Call: Thought her appoinment this am was at 11 and came in late fo rher 10am. Wonders if she can just sit & wait incase someone doesn't show up or something. Her reflux is really bothering her. Initial call taken by: Leanor Kail [facility],  October 26, 2009 11:08 AM  Follow-up for Phone Call        per Dr Russella Dar ok to see her now Follow-up by: Darcey Nora RN, CGRN,  October 26, 2009 11:09 AM

## 2010-03-02 NOTE — Assessment & Plan Note (Signed)
Summary: F/U GERD...AS.   History of Present Illness Visit Type: Follow-up Visit Primary GI MD: Elie Goody MD [facility] Primary Provider: Loreen Freud, DO Chief Complaint: Pt. c/o food getting stuck in chest  History of Present Illness:   This is a 70 year old female who is here today with her daughter. Patient complains of postprandial epigastric and lower sternal of fullness for several months. She has GERD and gastroparesis. She states her symptoms are relieved with TUMS. However, she needs to use TUMS about 3 times a day. She feels that Nexium was more effective in controlling her symptoms than Dexliant.   GI Review of Systems    Reports chest pain and  dysphagia with solids.      Denies abdominal pain, acid reflux, belching, bloating, dysphagia with liquids, heartburn, loss of appetite, nausea, vomiting, vomiting blood, weight loss, and  weight gain.      Reports constipation.     Denies anal fissure, black tarry stools, change in bowel habit, diarrhea, diverticulosis, fecal incontinence, heme positive stool, hemorrhoids, irritable bowel syndrome, jaundice, light color stool, liver problems, rectal bleeding, and  rectal pain.   Current Medications (verified): 1)  Glipizide 10 Mg Tabs (Glipizide) .Marland Kitchen.. 1 Two Times A Day 2)  Aggrenox 25-200 Mg  Cp12 (Aspirin-Dipyridamole) .Marland Kitchen.. 1 By Mouth Two Times A Day 3)  Klor-Con M20 20 Meq Cr-Tabs (Potassium Chloride Crys Cr) .... Take 1 Tab Two Times A Day 4)  Diovan Hct 320-25 Mg Tabs (Valsartan-Hydrochlorothiazide) .Marland Kitchen.. 1 By Mouth Once Daily 5)  Duragesic-50 50 Mcg/hr Pt72 (Fentanyl) .Marland Kitchen.. 1 Patch Every 72 Hours 6)  Lipitor 20 Mg Tabs (Atorvastatin Calcium) .Marland Kitchen.. 1 By Mouth At Bedtime 7)  Dexilant 60 Mg Cpdr (Dexlansoprazole) .Marland Kitchen.. 1 By Mouth Once Daily. 8)  Cymbalta 60 Mg Cpep (Duloxetine Hcl) .... 2  By Mouth Daily For Depression and Pain 9)  Ferrous Sulfate 325 (65 Fe) Mg Tabs (Ferrous Sulfate) .Marland Kitchen.. 1 By Mouth Once Daily 10)  Stool Softener  100 Mg Caps (Docusate Sodium) .... Take By Mouth Once Daily As Needed  Allergies (verified): 1)  Vytorin  Past History:  Past Medical History: Reviewed history from 06/25/2009 and no changes required. Diabetes mellitus, type II Hyperlipidemia Hypertension Cerebrovascular accident, hx of GERD CHRONIC BACK PAIN Anxiety Disorder Depression Allergies Hiatal hernia Hemorrhoids Delayed Gastric Empyting  Past Surgical History: Reviewed history from 04/16/2007 and no changes required. Tonsillectomy  Tubaligation (1975) Hysterectomy (1610) Appendectomy (1982) Lt Breast; benign tumor (1975)  Family History: Reviewed history from 07/03/2006 and no changes required. Family History of Cervical cancer Family History Diabetes 1st degree relative Family History Hypertension Family History Other cancer- Breast, Prostate Family History MI No FH of Colon Cancer:  Social History: Reviewed history from 06/23/2008 and no changes required. Married Never Smoked Alcohol use-no Patient has never smoked.  Illicit Drug Use - no Daily Caffeine Use  Review of Systems       The patient complains of fatigue, itching, shortness of breath, skin rash, and swelling of feet/legs.         The pertinent positives and negatives are noted as above and in the HPI. All other ROS were reviewed and were negative.   Vital Signs:  Patient profile:   70 year old female Height:      65.25 inches Weight:      169.38 pounds BMI:     28.07 Pulse rate:   76 / minute Pulse rhythm:   regular BP sitting:   116 / 70  (  left arm)  Vitals Entered By: Milford Cage NCMA (Jun 30, 2009 10:02 AM)  Physical Exam  General:  Well developed, well nourished, no acute distress. Head:  Normocephalic and atraumatic. Eyes:  PERRLA, no icterus. Mouth:  No deformity or lesions, dentition normal. Lungs:  Clear throughout to auscultation. Heart:  Regular rate and rhythm; no murmurs, rubs,  or bruits. Abdomen:  Soft,  nontender and nondistended. No masses, hepatosplenomegaly or hernias noted. Normal bowel sounds. Psych:  Alert and cooperative. Normal mood and affect.  Impression & Recommendations:  Problem # 1:  GERD (ICD-530.81) Refractory gastroparesis and reflux symptoms. EGD in September 2008 showed a small hiatal hernia. Discontinue Dexilant and begin Nexium 40 mg b.i.d. Continue TUMS p.r.n. All standard lifestyle and dietary measures for GERD and gastroparesis. Consider the addition of a promotility agent at her return office visit if her symptoms are not adequately controlled.  Problem # 2:  GASTROPARESIS (ICD-536.3) See problem #1.  Problem # 3:  SCREENING COLORECTAL-CANCER (ICD-V76.51) Screening colonoscopy December 2017.  Patient Instructions: 1)  Discontinue Dexilant. 2)  Pick up your prescription from your pharmacy.  3)  Avoid foods high in acid content ( tomatoes, citrus juices, spicy foods) . Avoid eating within 3 to 4 hours of lying down or before exercising. Do not over eat; try smaller more frequent meals. Elevate head of bed four inches when sleeping.  4)  Liquids and foods should be eaten in small, frequent meals. Refer to brochure for further instruction.  5)  Please schedule a follow-up appointment in 6 weeks.  6)  Copy sent to : Loreen Freud, DO 7)  The medication list was reviewed and reconciled.  All changed / newly prescribed medications were explained.  A complete medication list was provided to the patient / caregiver.  Prescriptions: NEXIUM 40 MG CPDR (ESOMEPRAZOLE MAGNESIUM) one tablet by mouth two times a day  #60 x 11   Entered by:   Christie Nottingham CMA (AAMA)   Authorized by:   Meryl Dare MD [facility]   Signed by:   Meryl Dare MD FACG on 06/30/2009   Method used:   Electronically to        [id number]retail[facility]       [address]       [place], [place]  [address]       Ph: [phone]       Fax: [phone]   RxID:    [id number]

## 2010-03-02 NOTE — Assessment & Plan Note (Signed)
Summary: vag odor/cbs   Vital Signs:  Patient profile:   70 year old female Weight:      171 pounds Pulse rate:   85 / minute Pulse rhythm:   regular BP sitting:   138 / 84  (left arm) Cuff size:   regular  Vitals Entered By: Army Fossa CMA (Jun 22, 2009 3:08 PM) CC: Pt here because she has vaginal discharge and odor. Pt states that her "mouth has been breaking out on the corners."    History of Present Illness: Pt here c/o vaginal d/c---she has been using metrogel with no relief.  + itching and odor  Current Medications (verified): 1)  Glipizide 10 Mg Tabs (Glipizide) .Marland Kitchen.. 1 Two Times A Day 2)  Aggrenox 25-200 Mg  Cp12 (Aspirin-Dipyridamole) .Marland Kitchen.. 1 By Mouth Two Times A Day 3)  Klor-Con M20 20 Meq Cr-Tabs (Potassium Chloride Crys Cr) .... Take 1 Tab Two Times A Day 4)  Diovan Hct 320-25 Mg Tabs (Valsartan-Hydrochlorothiazide) .Marland Kitchen.. 1 By Mouth Once Daily 5)  Duragesic-50 50 Mcg/hr Pt72 (Fentanyl) .Marland Kitchen.. 1 Patch Every 72 Hours 6)  Lipitor 20 Mg Tabs (Atorvastatin Calcium) .Marland Kitchen.. 1 By Mouth At Bedtime 7)  Slow Fe 160 (50 Fe) Mg Cr-Tabs (Ferrous Sulfate Dried) .Marland Kitchen.. 1 By Mouth Two Times A Day 8)  Dexilant 60 Mg Cpdr (Dexlansoprazole) .Marland Kitchen.. 1 By Mouth Two Times A Day 9)  Cymbalta 60 Mg Cpep (Duloxetine Hcl) .... 2  By Mouth Daily For Depression and Pain 10)  Cleocin 300 Mg Caps (Clindamycin Hcl) .Marland Kitchen.. 1 By Mouth Two Times A Day For 7 Days  Allergies: 1)  Vytorin  Past History:  Past medical, surgical, family and social histories (including risk factors) reviewed for relevance to current acute and chronic problems.  Past Medical History: Reviewed history from 06/23/2008 and no changes required. Diabetes mellitus, type II Hyperlipidemia Hypertension Cerebrovascular accident, hx of GERD CHRONIC BACK PAIN  Past Surgical History: Reviewed history from 04/16/2007 and no changes required. Tonsillectomy  Tubaligation (1975) Hysterectomy (1610) Appendectomy (1982) Lt Breast;  benign tumor (1975)  Family History: Reviewed history from 07/03/2006 and no changes required. Family History of Cervical cancer Family History Diabetes 1st degree relative Family History Hypertension Family History Other cancer- Breast, Prostate Family History MI  Social History: Reviewed history from 06/23/2008 and no changes required. Married Never Smoked Alcohol use-no Patient has never smoked.  Illicit Drug Use - no  Review of Systems      See HPI  Physical Exam  General:  Well-developed,well-nourished,in no acute distress; alert,appropriate and cooperative throughout examination Mouth:  edges of mouth dry, cracked Abdomen:  Bowel sounds positive,abdomen soft and non-tender without masses, organomegaly or hernias noted. Genitalia:  normal introitus, no external lesions, and vaginal discharge.   Psych:  normally interactive and good eye contact.     Impression & Recommendations:  Problem # 1:  VAGINITIS (ICD-616.10)  Her updated medication list for this problem includes:    Cleocin 300 Mg Caps (Clindamycin hcl) .Marland Kitchen... 1 by mouth two times a day for 7 days  Orders: Wet Prep (96045WU)  Discussed symptomatic relief and treatment options.   Problem # 2:  ANEMIA (ICD-285.9)  Her updated medication list for this problem includes:    Slow Fe 160 (50 Fe) Mg Cr-tabs (Ferrous sulfate dried) .Marland Kitchen... 1 by mouth two times a day  Orders: TLB-B12 + Folate Pnl (98119_14782-N56/OZH) TLB-IBC Pnl (Iron/FE;Transferrin) (83550-IBC) TLB-CBC Platelet - w/Differential (85025-CBCD)  Hgb: 11.1 (04/14/2009)   Hct: 34.4 (04/14/2009)  Platelets: 216.0 (04/14/2009) RBC: 3.65 (04/14/2009)   RDW: 13.1 (04/14/2009)   WBC: 8.2 (04/14/2009) MCV: 94.2 (04/14/2009)   MCHC: 32.2 (04/14/2009) Ferritin: 39.6 (04/14/2009) Iron: 60 (04/14/2009)   % Sat: 18.5 (04/14/2009) B12: 459 (03/26/2009)   Folate: 14.5 (03/26/2009)   TSH: 2.10 (02/24/2009)  Complete Medication List: 1)  Glipizide 10 Mg Tabs  (Glipizide) .Marland Kitchen.. 1 two times a day 2)  Aggrenox 25-200 Mg Cp12 (Aspirin-dipyridamole) .Marland Kitchen.. 1 by mouth two times a day 3)  Klor-con M20 20 Meq Cr-tabs (Potassium chloride crys cr) .... Take 1 tab two times a day 4)  Diovan Hct 320-25 Mg Tabs (Valsartan-hydrochlorothiazide) .Marland Kitchen.. 1 by mouth once daily 5)  Duragesic-50 50 Mcg/hr Pt72 (Fentanyl) .Marland Kitchen.. 1 patch every 72 hours 6)  Lipitor 20 Mg Tabs (Atorvastatin calcium) .Marland Kitchen.. 1 by mouth at bedtime 7)  Slow Fe 160 (50 Fe) Mg Cr-tabs (Ferrous sulfate dried) .Marland Kitchen.. 1 by mouth two times a day 8)  Dexilant 60 Mg Cpdr (Dexlansoprazole) .Marland Kitchen.. 1 by mouth two times a day 9)  Cymbalta 60 Mg Cpep (Duloxetine hcl) .... 2  by mouth daily for depression and pain 10)  Cleocin 300 Mg Caps (Clindamycin hcl) .Marland Kitchen.. 1 by mouth two times a day for 7 days  Other Orders: Venipuncture (60630)  Patient Instructions: 1)  get lysine from pharmacy---take 1000mg  three times a day  Prescriptions: CLEOCIN 300 MG CAPS (CLINDAMYCIN HCL) 1 by mouth two times a day for 7 days  #14 x 0   Entered and Authorized by:   Loreen Freud DO   Signed by:   Loreen Freud DO on 06/22/2009   Method used:   [facility]  [address] [id number]       [address]       [place], [place]  [address]       Ph: [phone]       Fax: [phone]   RxID:   [id number]   Laboratory Results   Urine Tests    Routine Urinalysis   Color: yellow Appearance: Clear Glucose: negative   (Normal Range: Negative) Bilirubin: negative   (Normal Range: Negative) Ketone: negative   (Normal Range: Negative) Spec. Gravity: 1.015   (Normal Range: 1.003-1.035) Blood: negative   (Normal Range: Negative) pH: 6.5   (Normal Range: 5.0-8.0) Protein: negative   (Normal Range: Negative) Urobilinogen: 0.2   (Normal Range: 0-1) Nitrite: negative   (Normal Range: Negative) Leukocyte Esterace: negative   (Normal Range: Negative)    Comments: Army Fossa CMA   Jun 22, 2009 3:20 PM    [employer] Source: vaginal WBC/hpf: 10-20 Bacteria/hpf: 1+ Clue cells/hpf: few  Positive whiff Yeast/hpf: none Trichomonas/hpf: none

## 2010-03-02 NOTE — Consult Note (Signed)
Summary: [facility]   Imported By: Lanelle Bal 09/17/2009 11:27:54  _____________________________________________________________________  External Attachment:    Type:   Image     Comment:   External Document

## 2010-03-02 NOTE — Miscellaneous (Signed)
Summary: Referral/[facility]  Referral/[facility]   Imported By: Lanelle Bal 11/03/2009 12:58:29  _____________________________________________________________________  External Attachment:    Type:   Image     Comment:   External Document

## 2010-03-02 NOTE — Assessment & Plan Note (Signed)
Summary: urethea problems//kn   Vital Signs:  Patient profile:   70 year old female Weight:      170.25 pounds Pulse rate:   80 / minute Pulse rhythm:   regular BP sitting:   122 / 80  (left arm) Cuff size:   regular  Vitals Entered By: Army Fossa CMA (July 09, 2009 1:55 PM) CC: Pt states that it is burning when she urinates x 1 week   History of Present Illness: Pt here c/o burning with urination and frequency again.  No fever, abd pain etc.  Pt still c/o dizziness and fatigue-- she has an appointment with neuro next week.    Current Medications (verified): 1)  Glipizide 10 Mg Tabs (Glipizide) .Marland Kitchen.. 1 Two Times A Day 2)  Aggrenox 25-200 Mg  Cp12 (Aspirin-Dipyridamole) .Marland Kitchen.. 1 By Mouth Two Times A Day 3)  Klor-Con M20 20 Meq Cr-Tabs (Potassium Chloride Crys Cr) .... Take 1 Tab Two Times A Day 4)  Diovan Hct 320-25 Mg Tabs (Valsartan-Hydrochlorothiazide) .Marland Kitchen.. 1 By Mouth Once Daily 5)  Duragesic-50 50 Mcg/hr Pt72 (Fentanyl) .Marland Kitchen.. 1 Patch Every 72 Hours 6)  Lipitor 20 Mg Tabs (Atorvastatin Calcium) .Marland Kitchen.. 1 By Mouth At Bedtime 7)  Cymbalta 60 Mg Cpep (Duloxetine Hcl) .... 2  By Mouth Daily For Depression and Pain 8)  Ferrous Sulfate 325 (65 Fe) Mg Tabs (Ferrous Sulfate) .Marland Kitchen.. 1 By Mouth Once Daily 9)  Stool Softener 100 Mg Caps (Docusate Sodium) .... Take By Mouth Once Daily As Needed 10)  Nexium 40 Mg Cpdr (Esomeprazole Magnesium) .... One Tablet By Mouth Two Times A Day 11)  Macrobid 100 Mg Caps (Nitrofurantoin Monohyd Macro) .Marland Kitchen.. 1 By Mouth Two Times A Day  Allergies: 1)  Vytorin  Past History:  Past medical, surgical, family and social histories (including risk factors) reviewed for relevance to current acute and chronic problems.  Past Medical History: Reviewed history from 06/25/2009 and no changes required. Diabetes mellitus, type II Hyperlipidemia Hypertension Cerebrovascular accident, hx of GERD CHRONIC BACK PAIN Anxiety Disorder Depression Allergies Hiatal  hernia Hemorrhoids Delayed Gastric Empyting  Past Surgical History: Reviewed history from 04/16/2007 and no changes required. Tonsillectomy  Tubaligation (1975) Hysterectomy (1610) Appendectomy (1982) Lt Breast; benign tumor (1975)  Family History: Reviewed history from 06/30/2009 and no changes required. Family History of Cervical cancer Family History Diabetes 1st degree relative Family History Hypertension Family History Other cancer- Breast, Prostate Family History MI No FH of Colon Cancer:  Social History: Reviewed history from 06/30/2009 and no changes required. Married Never Smoked Alcohol use-no Patient has never smoked.  Illicit Drug Use - no Daily Caffeine Use  Review of Systems      See HPI  Physical Exam  General:  Well-developed,well-nourished,in no acute distress; alert,appropriate and cooperative throughout examination Lungs:  Normal respiratory effort, chest expands symmetrically. Lungs are clear to auscultation, no crackles or wheezes. Abdomen:  Bowel sounds positive,abdomen soft and non-tender without masses, organomegaly or hernias noted. Psych:  normally interactive, good eye contact, and depressed affect.     Impression & Recommendations:  Problem # 1:  UTI'S, RECURRENT (ICD-599.0) Assessment Deteriorated  Her updated medication list for this problem includes:    Macrobid 100 Mg Caps (Nitrofurantoin monohyd macro) .Marland Kitchen... 1 by mouth two times a day  Orders: T-Culture, Urine ([id number]) Urology Referral (Urology) UA Dipstick w/o Micro (manual) (19147)  Encouraged to push clear liquids, get enough rest, and take acetaminophen as needed. To be seen in 10 days if no improvement,  sooner if worse.  Problem # 2:  DIZZINESS (ICD-780.4) Assessment: Unchanged f/u neuro  Problem # 3:  DEPRESSION (ICD-311) Assessment: Deteriorated pt will make appointment with psych Her updated medication list for this problem includes:    Cymbalta 60 Mg Cpep  (Duloxetine hcl) .Marland Kitchen... 2  by mouth daily for depression and pain  Complete Medication List: 1)  Glipizide 10 Mg Tabs (Glipizide) .Marland Kitchen.. 1 two times a day 2)  Aggrenox 25-200 Mg Cp12 (Aspirin-dipyridamole) .Marland Kitchen.. 1 by mouth two times a day 3)  Klor-con M20 20 Meq Cr-tabs (Potassium chloride crys cr) .... Take 1 tab two times a day 4)  Diovan Hct 320-25 Mg Tabs (Valsartan-hydrochlorothiazide) .Marland Kitchen.. 1 by mouth once daily 5)  Duragesic-50 50 Mcg/hr Pt72 (Fentanyl) .Marland Kitchen.. 1 patch every 72 hours 6)  Lipitor 20 Mg Tabs (Atorvastatin calcium) .Marland Kitchen.. 1 by mouth at bedtime 7)  Cymbalta 60 Mg Cpep (Duloxetine hcl) .... 2  by mouth daily for depression and pain 8)  Ferrous Sulfate 325 (65 Fe) Mg Tabs (Ferrous sulfate) .Marland Kitchen.. 1 by mouth once daily 9)  Stool Softener 100 Mg Caps (Docusate sodium) .... Take by mouth once daily as needed 10)  Nexium 40 Mg Cpdr (Esomeprazole magnesium) .... One tablet by mouth two times a day 11)  Macrobid 100 Mg Caps (Nitrofurantoin monohyd macro) .Marland Kitchen.. 1 by mouth two times a day Prescriptions: MACROBID 100 MG CAPS (NITROFURANTOIN MONOHYD MACRO) 1 by mouth two times a day  #14 x 0   Entered and Authorized by:   Loreen Freud DO   Signed by:   Loreen Freud DO on 07/09/2009   Method used:   [facility]  [address] [phone][facility] ([facility]ail[facility]       [address]       [place], [place]  [address]       Ph: [phone]       Fax: [phone]   RxID:   [id number]   Laboratory Results   Urine Tests    Routine Urinalysis   Color: yellow Appearance: Clear Glucose: negative   (Normal Range: Negative) Bilirubin: small   (Normal Range: Negative) Ketone: negative   (Normal Range: Negative) Spec. Gravity: 1.020   (Normal Range: 1.003-1.035) Blood: negative   (Normal Range: Negative) pH: 6.5   (Normal Range: 5.0-8.0) Protein: negative   (Normal Range: Negative) Urobilinogen: 0.2   (Normal Range: 0-1) Nitrite: negative   (Normal Range:  Negative) Leukocyte Esterace: trace   (Normal Range: Negative)    Comments: Army Fossa CMA  July 09, 2009 2:03 PM

## 2010-03-02 NOTE — Progress Notes (Signed)
  Phone Note From Other Clinic   Caller: DR Griffen--hearing clinic Call For: [facility] Summary of Call: Pt was seen yesterday----Vestibular Hypofunction, LE weakness B/L , disuse weakness ---- pt also told him she was c/o double vision and floating sensation.   He is concerned about hypoperfusion to brain and concerned about risk of recurrent stroke-----  will refer back to Neuro---  they will send [place] a report that can be forwarded along with our ov from yesterday.  Please inform pt that referral being done. Initial call taken by: Loreen Freud DO,  Jun 09, 2009 8:38 AM  Follow-up for Phone Call        Pt is aware that referral is being done. Army Fossa CMA  Jun 09, 2009 8:55 AM

## 2010-03-02 NOTE — Medication Information (Signed)
Summary: Approval for Cymbalta/[facility]  Approval for Cymbalta/[facility]   Imported By: Lanelle Bal 06/23/2009 13:09:35  _____________________________________________________________________  External Attachment:    Type:   Image     Comment:   External Document

## 2010-03-02 NOTE — Assessment & Plan Note (Signed)
Summary: F/U FROM ED/[facility]   Vital Signs:  Patient profile:   70 year old female Weight:      173 pounds Pulse rate:   86 / minute Pulse rhythm:   regular BP sitting:   126 / 80  (left arm) Cuff size:   regular  Vitals Entered By: Army Fossa CMA (Jun 08, 2009 11:30 AM) CC: Pt here for ER follow up, med list reviewed.   History of Present Illness: Pt here f/u from ER for weakness / fatigue.  Exam was normal.  See ER note.  Pt needs refills.  Pt still feels the same --  pt states she has trouble walking and has episodes of confusion--- she went to ER and exam normal---  they felt it may be from medication.  Pt is confused about meds---she has brought them in today.  Pt switched back to cymbalta 60 mg 2 a day and stopped citalopram and she was taking 75 micrograms duragesic and she states blood sugars may be running low.     Current Medications (verified): 1)  Glipizide 10 Mg Tabs (Glipizide) .Marland Kitchen.. 1 Two Times A Day 2)  Aggrenox 25-200 Mg  Cp12 (Aspirin-Dipyridamole) .Marland Kitchen.. 1 By Mouth Two Times A Day 3)  Klor-Con M20 20 Meq Cr-Tabs (Potassium Chloride Crys Cr) .... Take 1 Tab Two Times A Day 4)  Diovan Hct 320-25 Mg Tabs (Valsartan-Hydrochlorothiazide) .Marland Kitchen.. 1 By Mouth Once Daily 5)  Ativan 0.5 Mg Tabs (Lorazepam) .Marland Kitchen.. 1 By Mouth Three Times A Day As Needed 6)  Duragesic-50 50 Mcg/hr Pt72 (Fentanyl) .Marland Kitchen.. 1 Patch Every 72 Hours 7)  Lipitor 20 Mg Tabs (Atorvastatin Calcium) .Marland Kitchen.. 1 By Mouth At Bedtime 8)  Klonopin 0.5 Mg Tabs (Clonazepam) .Marland Kitchen.. 1 By Mouth Two Times A Day 9)  Slow Fe 160 (50 Fe) Mg Cr-Tabs (Ferrous Sulfate Dried) .Marland Kitchen.. 1 By Mouth Two Times A Day 10)  Dexilant 60 Mg Cpdr (Dexlansoprazole) .Marland Kitchen.. 1 By Mouth Two Times A Day 11)  Cymbalta 60 Mg Cpep (Duloxetine Hcl) .... 2  By Mouth Daily For Depression and Pain 12)  Metrogel-Vaginal 0.75 % Gel (Metronidazole) .Marland Kitchen.. 1 Appl Pv At Bedtime For 5 Nights.  Allergies: 1)  Vytorin  Past History:  Past medical, surgical, family and  social histories (including risk factors) reviewed for relevance to current acute and chronic problems.  Past Medical History: Reviewed history from 06/23/2008 and no changes required. Diabetes mellitus, type II Hyperlipidemia Hypertension Cerebrovascular accident, hx of GERD CHRONIC BACK PAIN  Past Surgical History: Reviewed history from 04/16/2007 and no changes required. Tonsillectomy  Tubaligation (1975) Hysterectomy (0347) Appendectomy (1982) Lt Breast; benign tumor (1975)  Family History: Reviewed history from 07/03/2006 and no changes required. Family History of Cervical cancer Family History Diabetes 1st degree relative Family History Hypertension Family History Other cancer- Breast, Prostate Family History MI  Social History: Reviewed history from 06/23/2008 and no changes required. Married Never Smoked Alcohol use-no Patient has never smoked.  Illicit Drug Use - no  Review of Systems      See HPI  Physical Exam  General:  Well-developed,well-nourished,in no acute distress; alert,appropriate and cooperative throughout examination Extremities:  No clubbing, cyanosis, edema, or deformity noted with normal full range of motion of all joints.   Neurologic:  alert & oriented X3, strength normal in all extremities, and DTRs symmetrical and normal.   Pt struggles to get out of chair and on exam table but is able to do it without help---much more difficult for  her than usual Psych:  Oriented X3, memory intact for recent and remote, and normally interactive.  She got a little confused when discussing meds but othewise she answered all questions appropriately  Diabetes Management Exam:    Eye Exam:       Eye Exam done elsewhere          Date: 03/11/2009          Results: diabetic retinopathy          Done by: Elmer Picker   Impression & Recommendations:  Problem # 1:  WEAKNESS (ICD-780.79) Assessment New b/l legs--- pt with good strength on exam but is obviously  having difficulty moving around Now that pt is back on cymbalta 60 2 a day---  decrease duragesic to 50  if symptoms do not improve ---refer to Neurology or back to Dr Jule Ser Orders: Glucose, (CBG) [phone]) ER records reviewed  Problem # 2:  DIZZINESS (ICD-780.4)  pt seeing audiologists today for evaluation for dizziness----- ongoing Orders: Glucose, (CBG) (09811)  Complete Medication List: 1)  Glipizide 10 Mg Tabs (Glipizide) .Marland Kitchen.. 1 two times a day 2)  Aggrenox 25-200 Mg Cp12 (Aspirin-dipyridamole) .Marland Kitchen.. 1 by mouth two times a day 3)  Klor-con M20 20 Meq Cr-tabs (Potassium chloride crys cr) .... Take 1 tab two times a day 4)  Diovan Hct 320-25 Mg Tabs (Valsartan-hydrochlorothiazide) .Marland Kitchen.. 1 by mouth once daily 5)  Ativan 0.5 Mg Tabs (Lorazepam) .Marland Kitchen.. 1 by mouth three times a day as needed 6)  Duragesic-50 50 Mcg/hr Pt72 (Fentanyl) .Marland Kitchen.. 1 patch every 72 hours 7)  Lipitor 20 Mg Tabs (Atorvastatin calcium) .Marland Kitchen.. 1 by mouth at bedtime 8)  Klonopin 0.5 Mg Tabs (Clonazepam) .Marland Kitchen.. 1 by mouth two times a day 9)  Slow Fe 160 (50 Fe) Mg Cr-tabs (Ferrous sulfate dried) .Marland Kitchen.. 1 by mouth two times a day 10)  Dexilant 60 Mg Cpdr (Dexlansoprazole) .Marland Kitchen.. 1 by mouth two times a day 11)  Cymbalta 60 Mg Cpep (Duloxetine hcl) .... 2  by mouth daily for depression and pain 12)  Metrogel-vaginal 0.75 % Gel (Metronidazole) .Marland Kitchen.. 1 appl pv at bedtime for 5 nights. Prescriptions: DURAGESIC-50 50 MCG/HR PT72 (FENTANYL) 1 patch every 72 hours  #10 x 0   Entered and Authorized by:   Loreen Freud DO   Signed by:   Loreen Freud DO on 06/08/2009   Method used:   Print then Give to Patient   RxID:   [id number] AGGRENOX 25-200 MG  CP12 (ASPIRIN-DIPYRIDAMOLE) 1 by mouth two times a day  #180 x 3   Entered and Authorized by:   Loreen Freud DO   Signed by:   Loreen Freud DO on 06/08/2009   Method used:   Electronically to        [facility]* (mail-order)       [address]        [place], [place]  [address]       Ph: [phone]       Fax: [phone]   RxID:   [id number] CYMBALTA 60 MG CPEP (DULOXETINE HCL) 2  by mouth daily for depression and pain  #180 x 3   Entered and Authorized by:   Loreen Freud DO   Signed by:   Loreen Freud DO on 06/08/2009   Method used:   Electronically to        [facility]* (mail-order)       [address]  [place], [place]  [address]       Ph: [phone]       Fax: [phone]   RxID:   [id number]   Laboratory Results   Blood Tests   Date/Time Received: Jun 08, 2009 12:05 PM  Date/Time Reported: Jun 08, 2009 12:05 PM   Glucose (random): 123 mg/dL   (Normal Range: 84-132)

## 2010-03-02 NOTE — Progress Notes (Signed)
Summary: triage   Phone Note Call from Patient Call back at Home Phone [phone]   Caller: Patient Call For: Dr Russella Dar Reason for Call: Talk to Nurse Summary of Call: Patient wants to be seen sooner than 11-17 or wants to know what to do for severe acid reflux. Patient states that she's been taking meds several times a day along with tums. Initial call taken by: Tawni Levy,  October 22, 2009 12:56 PM  Follow-up for Phone Call        patient will come in and see Dr Russella Dar on Monday at 10:00 Follow-up by: Darcey Nora RN, CGRN,  October 22, 2009 2:30 PM

## 2010-03-02 NOTE — Progress Notes (Signed)
Summary: Refill Request  Phone Note Refill Request Message from:  [facility] [address] Fax #: [phone]  Refills Requested: Medication #1:  ATIVAN 0.5 MG TABS 1 by mouth three times a day as needed   Dosage confirmed as above?Dosage Confirmed   Brand Name Necessary? No   Supply Requested: 1 month   Last Refilled: 11/18/2008 Next Appointment Scheduled: 4.11.11 Initial call taken by: Harold Barban,  May 08, 2009 11:51 AM  Follow-up for Phone Call        last ov- 03/26/09. Army Fossa CMA  May 08, 2009 1:01 PM   Additional Follow-up for Phone Call Additional follow up Details #1::        ok to refill x1---2 refills Additional Follow-up by: Loreen Freud DO,  May 08, 2009 1:11 PM    Prescriptions: ATIVAN 0.5 MG TABS (LORAZEPAM) 1 by mouth three times a day as needed  #90 x 2   Entered by:   Army Fossa CMA   Authorized by:   Loreen Freud DO   Signed by:   Army Fossa CMA on 05/08/2009   Method used:   Printed then [facility]ed [facility] ...       [facility]  [address] [id number]       [address]       [place], [place]  [address]       Ph: [phone]       Fax: [phone]   RxID:   [id number]

## 2010-03-02 NOTE — Assessment & Plan Note (Signed)
Summary: reflux/sheri   History of Present Illness Visit Type: follow up Primary GI MD: Elie Goody MD [facility] Primary Provider: Loreen Freud, DO Chief Complaint: Patient complains of increased acid reflux and epigastric pain for over a month. She is taking Tums on a daily basis along with Nexium which she has increased. She is having alot of nasuea and feels like her food is not digesting.   History of Present Illness:   This is a 70 year old female here today with her husband. She has substantial reflux symptoms with nausea and the sensation that her food is not digesting. She describes discomfort in bloating in her epigastric area.   GI Review of Systems    Reports abdominal pain and  acid reflux.     Location of  Abdominal pain: epigastric area.    Denies belching, bloating, chest pain, dysphagia with liquids, dysphagia with solids, heartburn, loss of appetite, nausea, vomiting, vomiting blood, weight loss, and  weight gain.        Denies anal fissure, black tarry stools, change in bowel habit, constipation, diarrhea, diverticulosis, fecal incontinence, heme positive stool, hemorrhoids, irritable bowel syndrome, jaundice, light color stool, liver problems, rectal bleeding, and  rectal pain.   Current Medications (verified): 1)  Glipizide 10 Mg Tabs (Glipizide) .Marland Kitchen.. 1 Two Times A Day 2)  Aggrenox 25-200 Mg  Cp12 (Aspirin-Dipyridamole) .Marland Kitchen.. 1 By Mouth Two Times A Day 3)  Klor-Con M20 20 Meq Cr-Tabs (Potassium Chloride Crys Cr) .... Take 1 Tab Two Times A Day 4)  Diovan Hct 320-25 Mg Tabs (Valsartan-Hydrochlorothiazide) .Marland Kitchen.. 1 By Mouth Once Daily 5)  Duragesic-50 50 Mcg/hr Pt72 (Fentanyl) .Marland Kitchen.. 1 Patch Every 72 Hours 6)  Lipitor 20 Mg Tabs (Atorvastatin Calcium) .Marland Kitchen.. 1 By Mouth At Bedtime 7)  Cymbalta 60 Mg Cpep (Duloxetine Hcl) .... 2  By Mouth Daily For Depression and Pain 8)  Ferrous Sulfate 325 (65 Fe) Mg Tabs (Ferrous Sulfate) .Marland Kitchen.. 1 By Mouth Once Daily 9)  Stool Softener 100 Mg  Caps (Docusate Sodium) .... Take By Mouth Once Daily As Needed 10)  Nexium 40 Mg Cpdr (Esomeprazole Magnesium) .... Take Two Tabs By Mouth Every Morning and As Needed During The Day 11)  Naftin 1 % Crea (Naftifine Hcl) .... Apply Once Daily 12)  Nystatin 100000 Unit/gm Powd (Nystatin) .... Use Three Times A Day 13)  Tums 500 Mg Chew (Calcium Carbonate Antacid) .... Chew 3-4 Once Daily As Needed  Allergies (verified): 1)  Vytorin  Past History:  Past Medical History: Diabetes mellitus, type II Hyperlipidemia Hypertension Cerebrovascular accident, hx of GERD CHRONIC BACK PAIN Anxiety Disorder Depression Allergies Hiatal hernia Hemorrhoids Gastroparesis (78% retention at 120 min)  Past Surgical History: Reviewed history from 04/16/2007 and no changes required. Tonsillectomy  Tubaligation (1975) Hysterectomy (1982) Appendectomy (1982) Lt Breast; benign tumor (1975)  Family History: Family History of Cervical cancer: mother Family History Diabetes: sister X3, brothers X1 Family History Hypertension Family History Other cancer- Breast, oldest sister Prostate Cancer: brother, father Family History MI No FH of Colon Cancer:  Social History: Reviewed history from 06/30/2009 and no changes required. Married Never Smoked Alcohol use-no Patient has never smoked.  Illicit Drug Use - no Daily Caffeine Use  Review of Systems       The patient complains of anxiety-new, arthritis/joint pain, change in vision, fatigue, and night sweats.         The pertinent positives and negatives are noted as above and in the HPI. All other ROS were reviewed  and were negative.   Vital Signs:  Patient profile:   70 year old female Height:      65.25 inches Weight:      172.8 pounds BMI:     28.64 Pulse rate:   76 / minute Pulse rhythm:   regular BP sitting:   130 / 78  (left arm) Cuff size:   regular  Vitals Entered By: Harlow Mares CMA Duncan Dull) (October 26, 2009 11:22  AM)  Physical Exam  General:  Well developed, well nourished, no acute distress. Head:  Normocephalic and atraumatic. Eyes:  PERRLA, no icterus. Mouth:  No deformity or lesions, dentition normal. Lungs:  Clear throughout to auscultation. Heart:  Regular rate and rhythm; no murmurs, rubs,  or bruits. Abdomen:  Soft, nontender and nondistended. No masses, hepatosplenomegaly or hernias noted. Normal bowel sounds. Psych:  Alert and cooperative. Normal mood and affect.  Impression & Recommendations:  Problem # 1:  GASTROPARESIS (ICD-536.3) Begin metoclopramide 10 mg a.c. and h.s. Discussed the risks, benefits, and alternatives, as well as side effects and the long-term potential for tardive dyskinesia. If she needs to remain on a promotility medication consider changing to domperidone after 3 months. Continue standard gastroparesis diet: low fat, low fiber. Narcotics are likely exacerbating her gastroparesis. It would be ideal if this medication could be discontinued.  Problem # 2:  GERD (ICD-530.81) Continue Nexium 40 mg twice daily, taken 30 minutes before breakfast and dinner. May continue to use TUMS as needed. Continue all standard antireflux measures and use 4" bed blocks.  Patient Instructions: 1)  Take Nexium one capsule by mouth two times a day 30 mintues before breakfast and dinner.  2)  Pick up your prescription from your pharmacy.  3)  Avoid foods high in acid content ( tomatoes, citrus juices, spicy foods) . Avoid eating within 3 to 4 hours of lying down or before exercising. Do not over eat; try smaller more frequent meals. Elevate head of bed four inches when sleeping.  4)  Liquids and foods should be eaten in small, frequent meals. Refer to brochure for further instruction.  5)  Copy sent to : Loreen Freud, DO 6)  Please schedule a follow-up appointment in 3 months. 7)  The medication list was reviewed and reconciled.  All changed / newly prescribed medications were explained.   A complete medication list was provided to the patient / caregiver.  Prescriptions: REGLAN 10 MG TABS (METOCLOPRAMIDE HCL) one capsule by mouth before meals and at bedtime four times a day  #120 x 2   Entered by:   Christie Nottingham CMA (AAMA)   Authorized by:   Meryl Dare MD [facility]   Signed by:   Christie Nottingham CMA (AAMA) on 10/26/2009   Method used:   [facility]  [address] [id number]       [address]       [place], [place]  [address]       Ph: [phone]       Fax: [phone]   RxID:   [id number]

## 2010-03-02 NOTE — Progress Notes (Signed)
Summary: RX FOR CYMBALTA  Phone Note Call from Patient Call back at Home Phone [phone]   Caller: Patient Reason for Call: Refill Medication Summary of Call: PATIENT CAME IN WANTING A NEW SCRIPT FOR CYMBALTA 60 MG TO BE SENT TO [facility] AND ASK FOR SAMPLES WHICH WAS GIVEN AND LEFT THE PAPER TO SHOW [place] THE AMOUNT THAT SHE WOULD HAVE TO PAY FOR A NEW SCRIPT WILL BE.  Initial call taken by: Freddy Jaksch,  May 29, 2009 10:59 AM  Follow-up for Phone Call        I added med back to pts list since it was removed because it was to expensive. I gave pt 2 weeks worth of samples. Army Fossa CMA  May 29, 2009 11:01 AM     New/Updated Medications: CYMBALTA 60 MG CPEP (DULOXETINE HCL) 1 by mouth daily. Prescriptions: CYMBALTA 60 MG CPEP (DULOXETINE HCL) 1 by mouth daily.  #2 boxs x 0   Entered and Authorized by:   Army Fossa CMA   Signed by:   Army Fossa CMA on 05/29/2009   Method used:   Samples Given   RxID:   [id number]

## 2010-03-04 NOTE — Progress Notes (Signed)
Summary: Fentanyl refill  Phone Note Refill Request Call back at Home Phone [phone] Message from:  Patient on January 12, 2010 1:30 PM  Refills Requested: Medication #1:  DURAGESIC-50 50 MCG/HR PT72 1 patch every 72 hours will come to pick up  prescription---please call her at [phone] when prescription is ready for pickup  Next Appointment Scheduled: none Initial call taken by: Jerolyn Shin,  January 12, 2010 1:31 PM  Follow-up for Phone Call        last seen 10/20/09 and filled 12/01/09...Marland Kitchen please advise Follow-up by: Almeta Monas CMA Duncan Dull),  January 12, 2010 1:47 PM  Additional Follow-up for Phone Call Additional follow up Details #1::        refill x1  Additional Follow-up by: Loreen Freud DO,  January 12, 2010 1:49 PM    Additional Follow-up for Phone Call Additional follow up Details #2::    pt aware Rx ready for pick up... Almeta Monas CMA Duncan Dull)  January 12, 2010 1:59 PM   Prescriptions: DURAGESIC-50 50 MCG/HR PT72 (FENTANYL) 1 patch every 72 hours  #10 x 0   Entered and Authorized by:   Loreen Freud DO   Signed by:   Loreen Freud DO on 01/12/2010   Method used:   Print then Give to Patient   RxID:   [id number]

## 2010-03-04 NOTE — Letter (Signed)
Summary: Office Visit Letter  [facility]  [address] [place], [place] [address]   Phone: [phone]  Fax: [phone]      January 12, 2010 MRN: [id number]   [name] [address] [place], [place]  [address]   Dear [name],   According to our records, it is time for you to schedule a follow-up office visit with [place].   At your convenience, please call [phone] (option #2)to schedule an office visit. If you have any questions, concerns, or feel that this letter is in error, we would appreciate your call.   Sincerely,   Judie Petit T. Russella Dar, M.D.  [facility] [phone]

## 2010-03-04 NOTE — Progress Notes (Signed)
Summary: Rx Refill Request  Phone Note Refill Request Call back at [phone] Message from:  Pharmacy on February 16, 2010 8:00 AM  Refills Requested: Medication #1:  Lorazepam 0.5mg  Tablet   Supply Requested: 3 months   Last Refilled: 10/14/2009   Notes: Date Rx Written 05/08/09 [facility] Fax# [phone]   Method Requested: Fax to Local Pharmacy Next Appointment Scheduled: No Future Appointments Initial call taken by: Barnie Mort,  February 16, 2010 8:03 AM  Follow-up for Phone Call        last seen 02/15/09 and filled 10/14/09 please advise Follow-up by: Almeta Monas CMA Duncan Dull),  February 16, 2010 8:18 AM  Additional Follow-up for Phone Call Additional follow up Details #1::        refill x1 Additional Follow-up by: Loreen Freud DO,  February 16, 2010 8:40 AM    New/Updated Medications: LORAZEPAM 0.5 MG TABS (LORAZEPAM) 1 by mouth three times a day as needed Prescriptions: LORAZEPAM 0.5 MG TABS (LORAZEPAM) 1 by mouth three times a day as needed  #90 x 0   Entered by:   Almeta Monas CMA (AAMA)   Authorized by:   Loreen Freud DO   Signed by:   Almeta Monas CMA (AAMA) on 02/16/2010   Method used:   Printed then faxed to ...       [id number]       [address]       [place], [place]  [address]       Ph: [phone]       Fax: [phone]   RxID:   [id number]

## 2010-03-04 NOTE — Assessment & Plan Note (Signed)
Summary: FOLLOWUP/KN   Vital Signs:  Patient profile:   70 year old female Weight:      177.8 pounds Pulse rate:   68 / minute Pulse rhythm:   regular BP sitting:   140 / 90  (left arm) Cuff size:   regular  Vitals Entered By: Almeta Monas CMA Duncan Dull) (February 15, 2010 4:41 PM) CC: x few days c/o cough and congestion and med refills, URI symptoms--LABS DUE 272.4  250.0 286.9  cbcd, bmp, hep, lipid, hgba1c, ibc panal, ferritin   History of Present Illness:       This is a 70 year old woman who presents with URI symptoms.  The symptoms began 2 weeks ago.  The patient complains of nasal congestion, purulent nasal discharge, sore throat, productive cough, earache, and sick contacts, but denies clear nasal discharge and dry cough.  Associated symptoms include wheezing.  The patient denies fever, low-grade fever (<100.5 degrees), fever of 100.5-103 degrees, fever of 103.1-104 degrees, fever to >104 degrees, stiff neck, dyspnea, rash, vomiting, diarrhea, use of an antipyretic, and response to antipyretic.  The patient denies itchy watery eyes, itchy throat, sneezing, seasonal symptoms, response to antihistamine, headache, muscle aches, and severe fatigue.  The patient denies the following risk factors for Strep sinusitis: unilateral facial pain, unilateral nasal discharge, poor response to decongestant, double sickening, tooth pain, Strep exposure, tender adenopathy, and absence of cough.    Current Medications (verified): 1)  Glipizide 10 Mg Tabs (Glipizide) .Marland Kitchen.. 1 Two Times A Day 2)  Aggrenox 25-200 Mg  Cp12 (Aspirin-Dipyridamole) .Marland Kitchen.. 1 By Mouth Two Times A Day 3)  Klor-Con M20 20 Meq Cr-Tabs (Potassium Chloride Crys Cr) .... Take 1 Tab Two Times A Day 4)  Diovan Hct 320-25 Mg Tabs (Valsartan-Hydrochlorothiazide) .Marland Kitchen.. 1 By Mouth Once Daily 5)  Duragesic-50 50 Mcg/hr Pt72 (Fentanyl) .Marland Kitchen.. 1 Patch Every 72 Hours 6)  Lipitor 20 Mg Tabs (Atorvastatin Calcium) .Marland Kitchen.. 1 By Mouth At Bedtime 7)   Cymbalta 60 Mg Cpep (Duloxetine Hcl) .... 2  By Mouth Daily For Depression and Pain 8)  Ferrous Sulfate 325 (65 Fe) Mg Tabs (Ferrous Sulfate) .Marland Kitchen.. 1 By Mouth Once Daily 9)  Stool Softener 100 Mg Caps (Docusate Sodium) .... Take By Mouth Once Daily As Needed 10)  Nexium 40 Mg Cpdr (Esomeprazole Magnesium) .... One Capsule By Mouth Two Times A Day 30 Minutess Before Breakfast and Dinner. 11)  Naftin 1 % Crea (Naftifine Hcl) .... Apply Once Daily 12)  Nystatin 100000 Unit/gm Powd (Nystatin) .... Use Three Times A Day 13)  Tums 500 Mg Chew (Calcium Carbonate Antacid) .... Chew 3-4 Once Daily As Needed 14)  Reglan 10 Mg Tabs (Metoclopramide Hcl) .... One Capsule By Mouth Before Meals and At Bedtime Four Times A Day  Allergies (verified): 1)  Vytorin  Past History:  Past Medical History: Last updated: 10/26/2009 Diabetes mellitus, type II Hyperlipidemia Hypertension Cerebrovascular accident, hx of GERD CHRONIC BACK PAIN Anxiety Disorder Depression Allergies Hiatal hernia Hemorrhoids Gastroparesis (78% retention at 120 min)  Past Surgical History: Last updated: 04/16/2007 Tonsillectomy  Tubaligation (1975) Hysterectomy (1982) Appendectomy (1982) Lt Breast; benign tumor (1975)  Family History: Last updated: 10/26/2009 Family History of Cervical cancer: mother Family History Diabetes: sister X3, brothers X1 Family History Hypertension Family History Other cancer- Breast, oldest sister Prostate Cancer: brother, father Family History MI No FH of Colon Cancer:  Social History: Last updated: 06/30/2009 Married Never Smoked Alcohol use-no Patient has never smoked.  Illicit Drug Use - no  Daily Caffeine Use  Risk Factors: Alcohol Use: 0 (01/01/2009) Caffeine Use: 0 (01/01/2009) Exercise: yes (01/01/2009)  Risk Factors: Smoking Status: never (01/01/2009)  Family History: Reviewed history from 10/26/2009 and no changes required. Family History of Cervical cancer:  mother Family History Diabetes: sister X3, brothers X1 Family History Hypertension Family History Other cancer- Breast, oldest sister Prostate Cancer: brother, father Family History MI No FH of Colon Cancer:  Social History: Reviewed history from 06/30/2009 and no changes required. Married Never Smoked Alcohol use-no Patient has never smoked.  Illicit Drug Use - no Daily Caffeine Use  Review of Systems      See HPI  Physical Exam  General:  Well-developed,well-nourished,in no acute distress; alert,appropriate and cooperative throughout examination Nose:  External nasal examination shows no deformity or inflammation. Nasal mucosa are pink and moist without lesions or exudates. Mouth:  Oral mucosa and oropharynx without lesions or exudates.  Teeth in good repair. Neck:  No deformities, masses, or tenderness noted. Lungs:  occass wheeze with exp Heart:  Normal rate and regular rhythm. S1 and S2 normal without gallop, murmur, click, rub or other extra sounds. Psych:  Cognition and judgment appear intact. Alert and cooperative with normal attention span and concentration. No apparent delusions, illusions, hallucinations   Impression & Recommendations:  Problem # 1:  BRONCHITIS- ACUTE (ICD-466.0)  Take antibiotics and other medications as directed. Encouraged to push clear liquids, get enough rest, and take acetaminophen as needed. To be seen in 5-7 days if no improvement, sooner if worse.  Problem # 2:  POSTMENOPAUSAL STATUS (ICD-V49.81)  Orders: Radiology Referral (Radiology)  Complete Medication List: 1)  Glipizide 10 Mg Tabs (Glipizide) .Marland Kitchen.. 1 two times a day 2)  Aggrenox 25-200 Mg Cp12 (Aspirin-dipyridamole) .Marland Kitchen.. 1 by mouth two times a day 3)  Klor-con M20 20 Meq Cr-tabs (Potassium chloride crys cr) .... Take 1 tab two times a day 4)  Diovan Hct 320-25 Mg Tabs (Valsartan-hydrochlorothiazide) .Marland Kitchen.. 1 by mouth once daily 5)  Duragesic-50 50 Mcg/hr Pt72 (Fentanyl) .Marland Kitchen.. 1  patch every 72 hours 6)  Lipitor 20 Mg Tabs (Atorvastatin calcium) .Marland Kitchen.. 1 by mouth at bedtime 7)  Cymbalta 60 Mg Cpep (Duloxetine hcl) .... 2  by mouth daily for depression and pain 8)  Ferrous Sulfate 325 (65 Fe) Mg Tabs (Ferrous sulfate) .Marland Kitchen.. 1 by mouth once daily 9)  Stool Softener 100 Mg Caps (Docusate sodium) .... Take by mouth once daily as needed 10)  Nexium 40 Mg Cpdr (Esomeprazole magnesium) .... One capsule by mouth two times a day 30 minutess before breakfast and dinner. 11)  Naftin 1 % Crea (Naftifine hcl) .... Apply once daily 12)  Nystatin 100000 Unit/gm Powd (Nystatin) .... Use three times a day 13)  Tums 500 Mg Chew (Calcium carbonate antacid) .... Chew 3-4 once daily as needed 14)  Reglan 10 Mg Tabs (Metoclopramide hcl) .... One capsule by mouth before meals and at bedtime four times a day 15)  Norvasc 10 Mg Tabs (Amlodipine besylate) .Marland Kitchen.. 1 by mouth once daily 16)  Boric Acid Gran (boric Acid) 600mg  Supp   Patient Instructions: 1)  Acute Bronchitis symptoms for less then 10 days are not  helped by antibiotics. Take over the counter cough medications. Call if no improvement in 5-7 days, sooner if increasing cough, fever, or new symptoms ( shortness of breath, chest pain) .  2)  take antibiotic and get mucinex at the drug store for cough 3)  250.00,  272.4  401.9,  285.9  hgba1c,lipid, hep, bmp,cbcd, ibc--fasting labs Prescriptions: NAFTIN 1 % CREA (NAFTIFINE HCL) apply once daily  #1 month x 3   Entered and Authorized by:   Loreen Freud DO   Signed by:   Loreen Freud DO on 02/15/2010   Method used:   Electronically to        [facility]* (mail-order)       [address]       [place], [place]  [address]       Ph: [phone]       Fax: [phone]   RxID:   [id number] NYSTATIN 100000 UNIT/GM POWD (NYSTATIN) use three times a day  #1 month x 3   Entered and Authorized by:   Loreen Freud DO   Signed by:   Loreen Freud DO on 02/15/2010   Method  used:   Electronically to        [facility]* (mail-order)       [address]       [place], [place]  [address]       Ph: [phone]       Fax: [phone]   RxID:   [id number] DURAGESIC-50 50 MCG/HR PT72 (FENTANYL) 1 patch every 72 hours  #10 x 0   Entered and Authorized by:   Loreen Freud DO   Signed by:   Loreen Freud DO on 02/15/2010   Method used:   Print then Give to Patient   RxID:   [id number] NAFTIN 1 % CREA (NAFTIFINE HCL) apply once daily  #3 months x 3   Entered and Authorized by:   Loreen Freud DO   Signed by:   Loreen Freud DO on 02/15/2010   Method used:   Electronically to        [facility]* (mail-order)       [address]       [place], [place]  [address]       Ph: [phone]       Fax: [phone]   RxID:   [id number] NORVASC 10 MG TABS (AMLODIPINE BESYLATE) 1 by mouth once daily  #90 x 3   Entered and Authorized by:   Loreen Freud DO   Signed by:   Loreen Freud DO on 02/15/2010   Method used:   Electronically to        [facility]* (mail-order)       [address]       [place], [place]  [address]       Ph: [phone]       Fax: [phone]   RxID:   [id number] KLOR-CON M20 20 MEQ CR-TABS (POTASSIUM CHLORIDE CRYS CR) take 1 tab two times a day  #180 x 1   Entered and Authorized by:   Loreen Freud DO   Signed by:   Loreen Freud DO on 02/15/2010   Method used:   Electronically to        [facility]* (mail-order)       [address]       [place],   [address]       Ph: [phone]       Fax: [phone]   RxID:   [id number] GLIPIZIDE 10 MG TABS (GLIPIZIDE) 1 two times a day  #180 x 3   Entered and Authorized by:   Loreen Freud DO   Signed by:   Loreen Freud DO on 02/15/2010   Method used:  Electronically to        [facility]* (mail-order)       [address]       [place], [place]  [address]       Ph: [phone]        Fax: [phone]   RxID:   [id number]    Orders Added: 1)  Radiology Referral [Radiology] 2)  Est. Patient Level III [[address]]

## 2010-03-04 NOTE — Medication Information (Signed)
Summary: [facility]  [facility]   Imported By: Lanelle Bal 01/21/2010 11:57:18  _____________________________________________________________________  External Attachment:    Type:   Image     Comment:   External Document

## 2010-03-18 ENCOUNTER — Encounter: Payer: Self-pay | Admitting: Family Medicine

## 2010-03-18 ENCOUNTER — Ambulatory Visit (INDEPENDENT_AMBULATORY_CARE_PROVIDER_SITE_OTHER): Payer: 59 | Admitting: Family Medicine

## 2010-03-18 DIAGNOSIS — F3289 Other specified depressive episodes: Secondary | ICD-10-CM

## 2010-03-18 DIAGNOSIS — F329 Major depressive disorder, single episode, unspecified: Secondary | ICD-10-CM

## 2010-03-24 NOTE — Assessment & Plan Note (Signed)
Summary: discuss meds/cbs   Vital Signs:  Patient profile:   70 year old female Weight:      172.6 pounds Temp:     98.9 degrees F oral BP sitting:   122 / 76  (left arm) Cuff size:   large  Vitals Entered By: Almeta Monas CMA Duncan Dull) (March 18, 2010 2:53 PM) CC: wants to discuss starting prozac   History of Present Illness: Pt here to discuss depression.  She states she has become more depressed and started taking her daughters prozac.  She states she was better on prozac.  Current Medications (verified): 1)  Glipizide 10 Mg Tabs (Glipizide) .Marland Kitchen.. 1 Two Times A Day 2)  Aggrenox 25-200 Mg  Cp12 (Aspirin-Dipyridamole) .Marland Kitchen.. 1 By Mouth Two Times A Day 3)  Klor-Con M20 20 Meq Cr-Tabs (Potassium Chloride Crys Cr) .... Take 1 Tab Two Times A Day 4)  Diovan Hct 320-25 Mg Tabs (Valsartan-Hydrochlorothiazide) .Marland Kitchen.. 1 By Mouth Once Daily 5)  Duragesic-50 50 Mcg/hr Pt72 (Fentanyl) .Marland Kitchen.. 1 Patch Every 72 Hours 6)  Lipitor 20 Mg Tabs (Atorvastatin Calcium) .Marland Kitchen.. 1 By Mouth At Bedtime 7)  Cymbalta 60 Mg Cpep (Duloxetine Hcl) .Marland Kitchen.. 1 A Day For 1 Month Then Stop 8)  Ferrous Sulfate 325 (65 Fe) Mg Tabs (Ferrous Sulfate) .Marland Kitchen.. 1 By Mouth Once Daily 9)  Stool Softener 100 Mg Caps (Docusate Sodium) .... Take By Mouth Once Daily As Needed 10)  Nexium 40 Mg Cpdr (Esomeprazole Magnesium) .... One Capsule By Mouth Two Times A Day 30 Minutess Before Breakfast and Dinner. 11)  Naftin 1 % Crea (Naftifine Hcl) .... Apply Once Daily 12)  Nystatin 100000 Unit/gm Powd (Nystatin) .... Use Three Times A Day 13)  Tums 500 Mg Chew (Calcium Carbonate Antacid) .... Chew 3-4 Once Daily As Needed 14)  Reglan 10 Mg Tabs (Metoclopramide Hcl) .... One Capsule By Mouth Before Meals and At Bedtime Four Times A Day 15)  Norvasc 10 Mg Tabs (Amlodipine Besylate) .Marland Kitchen.. 1 By Mouth Once Daily 16)  Boric Acid  Gran (Boric Acid)  600mg   Supp 17)  Lorazepam 0.5 Mg Tabs (Lorazepam) .Marland Kitchen.. 1 By Mouth Three Times A Day As Needed 18)   Januvia 100 Mg Tabs (Sitagliptin Phosphate) .Marland Kitchen.. 1 By Mouth Once Daily 19)  Prozac 40 Mg Caps (Fluoxetine Hcl) .Marland Kitchen.. 1 By Mouth Once Daily  Allergies (verified): 1)  Vytorin  Past History:  Past Medical History: Last updated: 10/26/2009 Diabetes mellitus, type II Hyperlipidemia Hypertension Cerebrovascular accident, hx of GERD CHRONIC BACK PAIN Anxiety Disorder Depression Allergies Hiatal hernia Hemorrhoids Gastroparesis (78% retention at 120 min)  Past Surgical History: Last updated: 04/16/2007 Tonsillectomy  Tubaligation (1975) Hysterectomy (1982) Appendectomy (1982) Lt Breast; benign tumor (1975)  Family History: Last updated: 10/26/2009 Family History of Cervical cancer: mother Family History Diabetes: sister X3, brothers X1 Family History Hypertension Family History Other cancer- Breast, oldest sister Prostate Cancer: brother, father Family History MI No FH of Colon Cancer:  Social History: Last updated: 06/30/2009 Married Never Smoked Alcohol use-no Patient has never smoked.  Illicit Drug Use - no Daily Caffeine Use  Risk Factors: Alcohol Use: 0 (01/01/2009) Caffeine Use: 0 (01/01/2009) Exercise: yes (01/01/2009)  Risk Factors: Smoking Status: never (01/01/2009)  Family History: Reviewed history from 10/26/2009 and no changes required. Family History of Cervical cancer: mother Family History Diabetes: sister X3, brothers X1 Family History Hypertension Family History Other cancer- Breast, oldest sister Prostate Cancer: brother, father Family History MI No FH of Colon Cancer:  Social History:  Reviewed history from 06/30/2009 and no changes required. Married Never Smoked Alcohol use-no Patient has never smoked.  Illicit Drug Use - no Daily Caffeine Use  Review of Systems      See HPI  Physical Exam  General:  Well-developed,well-nourished,in no acute distress; alert,appropriate and cooperative throughout examination Psych:  Oriented  X3 and normally interactive.     Impression & Recommendations:  Problem # 1:  DEPRESSION (ICD-311) wean off cymbalta and start prozac Her updated medication list for this problem includes:    Cymbalta 60 Mg Cpep (Duloxetine hcl) .Marland Kitchen... 1 a day for 1 month then stop    Lorazepam 0.5 Mg Tabs (Lorazepam) .Marland Kitchen... 1 by mouth three times a day as needed    Prozac 40 Mg Caps (Fluoxetine hcl) .Marland Kitchen... 1 by mouth once daily  Complete Medication List: 1)  Glipizide 10 Mg Tabs (Glipizide) .Marland Kitchen.. 1 two times a day 2)  Aggrenox 25-200 Mg Cp12 (Aspirin-dipyridamole) .Marland Kitchen.. 1 by mouth two times a day 3)  Klor-con M20 20 Meq Cr-tabs (Potassium chloride crys cr) .... Take 1 tab two times a day 4)  Diovan Hct 320-25 Mg Tabs (Valsartan-hydrochlorothiazide) .Marland Kitchen.. 1 by mouth once daily 5)  Duragesic-50 50 Mcg/hr Pt72 (Fentanyl) .Marland Kitchen.. 1 patch every 72 hours 6)  Lipitor 20 Mg Tabs (Atorvastatin calcium) .Marland Kitchen.. 1 by mouth at bedtime 7)  Cymbalta 60 Mg Cpep (Duloxetine hcl) .Marland Kitchen.. 1 a day for 1 month then stop 8)  Ferrous Sulfate 325 (65 Fe) Mg Tabs (Ferrous sulfate) .Marland Kitchen.. 1 by mouth once daily 9)  Stool Softener 100 Mg Caps (Docusate sodium) .... Take by mouth once daily as needed 10)  Nexium 40 Mg Cpdr (Esomeprazole magnesium) .... One capsule by mouth two times a day 30 minutess before breakfast and dinner. 11)  Naftin 1 % Crea (Naftifine hcl) .... Apply once daily 12)  Nystatin 100000 Unit/gm Powd (Nystatin) .... Use three times a day 13)  Tums 500 Mg Chew (Calcium carbonate antacid) .... Chew 3-4 once daily as needed 14)  Reglan 10 Mg Tabs (Metoclopramide hcl) .... One capsule by mouth before meals and at bedtime four times a day 15)  Norvasc 10 Mg Tabs (Amlodipine besylate) .Marland Kitchen.. 1 by mouth once daily 16)  Boric Acid Gran (boric Acid) 600mg  Supp  17)  Lorazepam 0.5 Mg Tabs (Lorazepam) .Marland Kitchen.. 1 by mouth three times a day as needed 18)  Januvia 100 Mg Tabs (Sitagliptin phosphate) .Marland Kitchen.. 1 by mouth once daily 19)  Prozac 40 Mg  Caps (Fluoxetine hcl) .Marland Kitchen.. 1 by mouth once daily  Patient Instructions: 1)  Please schedule a follow-up appointment in 1 month.  2)  fasting labs 272.4  250.00 401.9  bmp, hep, lipid, hgba1c, microalbumin Prescriptions: PROZAC 40 MG CAPS (FLUOXETINE HCL) 1 by mouth once daily  #30 x 0   Entered and Authorized by:   Loreen Freud DO   Signed by:   Loreen Freud DO on 03/18/2010   Method used:   [facility]        [facility]       [address]       [place], [place]  [address]       Ph: [phone]       Fax: [phone]   RxID:   [id number] JANUVIA 100 MG TABS (SITAGLIPTIN PHOSPHATE) 1 by mouth once daily  #90 x 3   Entered and Authorized by:   Myrene Buddy  Lowne DO   Signed by:   Loreen Freud DO on 03/18/2010   Method used:   Electronically to        [facility]* ([facility]-order)       [address]       [place], [place]  [address]       Ph: [phone]       Fax: [phone]   RxID:   [id number] PROZAC 40 MG CAPS (FLUOXETINE HCL) 1 by mouth once daily  #90 x 3   Entered and Authorized by:   Loreen Freud DO   Signed by:   Loreen Freud DO on 03/18/2010   Method used:   Electronically to        [facility]* ([facility]-order)       [address]       [place], [place]  [address]       Ph: [phone]       Fax: [phone]   RxID:   [id number]    Orders Added: 1)  Est. Patient Level III [[address]]

## 2010-04-05 ENCOUNTER — Telehealth: Payer: Self-pay | Admitting: Family Medicine

## 2010-04-05 DIAGNOSIS — M2550 Pain in unspecified joint: Secondary | ICD-10-CM | POA: Insufficient documentation

## 2010-04-09 ENCOUNTER — Encounter: Payer: Self-pay | Admitting: Family Medicine

## 2010-04-13 NOTE — Letter (Signed)
Summary: Application for Handicapped Placard  Application for Handicapped Placard   Imported By: Maryln Gottron 04/09/2010 10:54:58  _____________________________________________________________________  External Attachment:    Type:   Image     Comment:   External Document

## 2010-04-13 NOTE — Progress Notes (Signed)
Summary: Request to see specialist  Phone Note Call from Patient Call back at Home Phone [phone]   Caller: Patient Call For: Loreen Freud DO Summary of Call: Patient is requesting a referral to a rheumatologist for arthritis.   Initial call taken by: Barnie Mort,  April 05, 2010 9:55 AM  Follow-up for Phone Call        referral put in Follow-up by: Loreen Freud DO,  April 05, 2010 11:31 AM  Additional Follow-up for Phone Call Additional follow up Details #1::        pt aware referral put in, advised she will be contacted by [facility] Additional Follow-up by: Almeta Monas CMA Duncan Dull),  April 05, 2010 11:49 AM  New Problems: POLYARTHRITIS (ICD-719.49)   New Problems: POLYARTHRITIS (ICD-719.49)

## 2010-04-14 ENCOUNTER — Encounter: Payer: Self-pay | Admitting: Family Medicine

## 2010-04-20 LAB — DIFFERENTIAL
Basophils Absolute: 0 10*3/uL (ref 0.0–0.1)
Basophils Relative: 1 % (ref 0–1)
Lymphocytes Relative: 40 % (ref 12–46)
Monocytes Absolute: 0.6 10*3/uL (ref 0.1–1.0)
Monocytes Relative: 8 % (ref 3–12)
Neutro Abs: 3.7 10*3/uL (ref 1.7–7.7)
Neutrophils Relative %: 48 % (ref 43–77)

## 2010-04-20 LAB — URINALYSIS, ROUTINE W REFLEX MICROSCOPIC
Glucose, UA: NEGATIVE mg/dL
Ketones, ur: NEGATIVE mg/dL
Nitrite: NEGATIVE
Specific Gravity, Urine: 1.021 (ref 1.005–1.030)
pH: 5.5 (ref 5.0–8.0)

## 2010-04-20 LAB — BASIC METABOLIC PANEL
BUN: 11 mg/dL (ref 6–23)
Chloride: 105 mEq/L (ref 96–112)
GFR calc non Af Amer: 53 mL/min — ABNORMAL LOW (ref >60)
Glucose, Bld: 131 mg/dL — ABNORMAL HIGH (ref 70–99)
Potassium: 3.7 mEq/L (ref 3.5–5.1)
Sodium: 139 mEq/L (ref 135–145)

## 2010-04-20 LAB — CBC
Hemoglobin: 11.6 g/dL — ABNORMAL LOW (ref 12.0–15.0)
MCHC: 32.8 g/dL (ref 30.0–36.0)
RBC: 3.79 MIL/uL — ABNORMAL LOW (ref 3.87–5.11)

## 2010-04-20 NOTE — Letter (Signed)
Summary: [facility]  [facility]   Imported By: Maryln Gottron 04/09/2010 10:59:17  _____________________________________________________________________  External Attachment:    Type:   Image     Comment:   External Document

## 2010-04-27 ENCOUNTER — Emergency Department (HOSPITAL_COMMUNITY): Payer: PRIVATE HEALTH INSURANCE

## 2010-04-27 ENCOUNTER — Telehealth: Payer: Self-pay | Admitting: Gastroenterology

## 2010-04-27 ENCOUNTER — Observation Stay (HOSPITAL_COMMUNITY)
Admission: EM | Admit: 2010-04-27 | Discharge: 2010-04-28 | DRG: 313 | Disposition: A | Discharge status: Home / Self Care | Payer: PRIVATE HEALTH INSURANCE | Attending: Cardiology | Admitting: Cardiology

## 2010-04-27 DIAGNOSIS — I69922 Dysarthria following unspecified cerebrovascular disease: Secondary | ICD-10-CM | POA: Insufficient documentation

## 2010-04-27 DIAGNOSIS — E119 Type 2 diabetes mellitus without complications: Secondary | ICD-10-CM | POA: Insufficient documentation

## 2010-04-27 DIAGNOSIS — R0609 Other forms of dyspnea: Secondary | ICD-10-CM | POA: Insufficient documentation

## 2010-04-27 DIAGNOSIS — G8929 Other chronic pain: Secondary | ICD-10-CM | POA: Insufficient documentation

## 2010-04-27 DIAGNOSIS — E876 Hypokalemia: Secondary | ICD-10-CM | POA: Insufficient documentation

## 2010-04-27 DIAGNOSIS — I10 Essential (primary) hypertension: Secondary | ICD-10-CM | POA: Insufficient documentation

## 2010-04-27 DIAGNOSIS — K219 Gastro-esophageal reflux disease without esophagitis: Secondary | ICD-10-CM | POA: Insufficient documentation

## 2010-04-27 DIAGNOSIS — D649 Anemia, unspecified: Secondary | ICD-10-CM | POA: Insufficient documentation

## 2010-04-27 DIAGNOSIS — E785 Hyperlipidemia, unspecified: Secondary | ICD-10-CM | POA: Insufficient documentation

## 2010-04-27 DIAGNOSIS — R0989 Other specified symptoms and signs involving the circulatory and respiratory systems: Secondary | ICD-10-CM | POA: Insufficient documentation

## 2010-04-27 DIAGNOSIS — R5381 Other malaise: Secondary | ICD-10-CM | POA: Insufficient documentation

## 2010-04-27 DIAGNOSIS — F3289 Other specified depressive episodes: Secondary | ICD-10-CM | POA: Insufficient documentation

## 2010-04-27 DIAGNOSIS — R0789 Other chest pain: Principal | ICD-10-CM | POA: Insufficient documentation

## 2010-04-27 DIAGNOSIS — R079 Chest pain, unspecified: Secondary | ICD-10-CM

## 2010-04-27 DIAGNOSIS — M1990 Unspecified osteoarthritis, unspecified site: Secondary | ICD-10-CM | POA: Insufficient documentation

## 2010-04-27 DIAGNOSIS — M549 Dorsalgia, unspecified: Secondary | ICD-10-CM | POA: Insufficient documentation

## 2010-04-27 DIAGNOSIS — R110 Nausea: Secondary | ICD-10-CM | POA: Insufficient documentation

## 2010-04-27 DIAGNOSIS — I69998 Other sequelae following unspecified cerebrovascular disease: Secondary | ICD-10-CM | POA: Insufficient documentation

## 2010-04-27 DIAGNOSIS — F329 Major depressive disorder, single episode, unspecified: Secondary | ICD-10-CM | POA: Insufficient documentation

## 2010-04-27 DIAGNOSIS — R0602 Shortness of breath: Secondary | ICD-10-CM | POA: Insufficient documentation

## 2010-04-27 DIAGNOSIS — Z79899 Other long term (current) drug therapy: Secondary | ICD-10-CM | POA: Insufficient documentation

## 2010-04-27 LAB — CBC
MCH: 29.8 pg (ref 26.0–34.0)
MCHC: 33.8 g/dL (ref 30.0–36.0)
Platelets: 241 10*3/uL (ref 150–400)
RDW: 13 % (ref 11.5–15.5)

## 2010-04-27 LAB — POCT I-STAT, CHEM 8
BUN: 16 mg/dL (ref 6–23)
Chloride: 100 mEq/L (ref 96–112)
HCT: 35 % — ABNORMAL LOW (ref 36.0–46.0)
Potassium: 3.1 mEq/L — ABNORMAL LOW (ref 3.5–5.1)

## 2010-04-27 LAB — COMPREHENSIVE METABOLIC PANEL
AST: 24 U/L (ref 0–37)
CO2: 26 mEq/L (ref 19–32)
Calcium: 9.3 mg/dL (ref 8.4–10.5)
Creatinine, Ser: 1.14 mg/dL (ref 0.4–1.2)
GFR calc Af Amer: 57 mL/min — ABNORMAL LOW (ref >60)
GFR calc non Af Amer: 47 mL/min — ABNORMAL LOW (ref >60)
Sodium: 138 mEq/L (ref 135–145)
Total Protein: 7.3 g/dL (ref 6.0–8.3)

## 2010-04-27 LAB — POCT CARDIAC MARKERS
CKMB, poc: 2.3 ng/mL (ref 1.0–8.0)
Myoglobin, poc: 171 ng/mL (ref 12–200)
Troponin i, poc: <0.05 ng/mL (ref 0.00–0.09)

## 2010-04-27 LAB — CK TOTAL AND CKMB (NOT AT ARMC): Relative Index: 1.2 (ref 0.0–2.5)

## 2010-04-27 LAB — GLUCOSE, CAPILLARY: Glucose-Capillary: 100 mg/dL — ABNORMAL HIGH (ref 70–99)

## 2010-04-27 NOTE — Telephone Encounter (Signed)
Left message for patient to call me back. 

## 2010-04-27 NOTE — Telephone Encounter (Signed)
Patient arrived in our lobby at 1:40 PM. C/O chest pain in the center of her chest radiating down her arm. Patient states this pain has just started since she arrived her. States she has had difficulty swallowing but has not had this type of pain before. 911 called. Amy Esterwood, PA out to see patient. Oxygen at 2 liters/min vis nasal canula. Aspirin 81 mg tab x 2 po given. BP 160/82 Heart rate 100 regular. 1:55 PM- EMS arrived and patient was transported to hospital.

## 2010-04-28 ENCOUNTER — Inpatient Hospital Stay (HOSPITAL_COMMUNITY): Payer: PRIVATE HEALTH INSURANCE

## 2010-04-28 LAB — LIPID PANEL
Cholesterol: 111 mg/dL (ref 0–200)
HDL: 54 mg/dL (ref >39)
LDL Cholesterol: 44 mg/dL (ref 0–99)
Total CHOL/HDL Ratio: 2.1 RATIO

## 2010-04-28 LAB — POTASSIUM: Potassium: 3.8 mEq/L (ref 3.5–5.1)

## 2010-04-28 MED ORDER — TECHNETIUM TC 99M TETROFOSMIN IV KIT
10.0000 | PACK | Freq: Once | INTRAVENOUS | Status: AC | PRN
  Administered 2010-04-28: 10 via INTRAVENOUS

## 2010-04-28 MED ORDER — TECHNETIUM TC 99M TETROFOSMIN IV KIT
30.0000 | PACK | Freq: Once | INTRAVENOUS | Status: AC | PRN
  Administered 2010-04-28: 30 via INTRAVENOUS

## 2010-04-29 ENCOUNTER — Other Ambulatory Visit (HOSPITAL_COMMUNITY): Payer: Medicare Other

## 2010-04-30 ENCOUNTER — Encounter: Payer: Self-pay | Admitting: Family Medicine

## 2010-04-30 ENCOUNTER — Ambulatory Visit (INDEPENDENT_AMBULATORY_CARE_PROVIDER_SITE_OTHER): Payer: PRIVATE HEALTH INSURANCE | Admitting: Family Medicine

## 2010-04-30 VITALS — BP 132/76 | HR 80 | Wt 170.2 lb

## 2010-04-30 DIAGNOSIS — E785 Hyperlipidemia, unspecified: Secondary | ICD-10-CM

## 2010-04-30 DIAGNOSIS — N390 Urinary tract infection, site not specified: Secondary | ICD-10-CM

## 2010-04-30 DIAGNOSIS — K219 Gastro-esophageal reflux disease without esophagitis: Secondary | ICD-10-CM

## 2010-04-30 DIAGNOSIS — E119 Type 2 diabetes mellitus without complications: Secondary | ICD-10-CM

## 2010-04-30 DIAGNOSIS — M549 Dorsalgia, unspecified: Secondary | ICD-10-CM

## 2010-04-30 LAB — POCT URINALYSIS DIPSTICK
Bilirubin, UA: NEGATIVE
Clarity, UA: NEGATIVE
Glucose, UA: NEGATIVE
Ketones, UA: NEGATIVE
Leukocytes, UA: NEGATIVE
Spec Grav, UA: 1.005

## 2010-04-30 LAB — LIPID PANEL
HDL: 58 mg/dL (ref >39.00)
Triglycerides: 86 mg/dL (ref 0.0–149.0)
VLDL: 17.2 mg/dL (ref 0.0–40.0)

## 2010-04-30 LAB — HEPATIC FUNCTION PANEL
ALT: 17 U/L (ref 0–35)
Bilirubin, Direct: 0.1 mg/dL (ref 0.0–0.3)
Total Bilirubin: 0.6 mg/dL (ref 0.3–1.2)

## 2010-04-30 LAB — AMYLASE: Amylase: 62 U/L (ref 27–131)

## 2010-04-30 LAB — BASIC METABOLIC PANEL
CO2: 29 mEq/L (ref 19–32)
Calcium: 8.9 mg/dL (ref 8.4–10.5)
Chloride: 104 mEq/L (ref 96–112)
Sodium: 140 mEq/L (ref 135–145)

## 2010-04-30 LAB — MICROALBUMIN / CREATININE URINE RATIO: Microalb, Ur: 1.6 mg/dL (ref 0.0–1.9)

## 2010-04-30 LAB — H. PYLORI ANTIBODY, IGG: H Pylori IgG: NEGATIVE

## 2010-04-30 LAB — HEMOGLOBIN A1C: Hgb A1c MFr Bld: 6.5 % (ref 4.6–6.5)

## 2010-04-30 MED ORDER — FENTANYL 50 MCG/HR TD PT72: 1.0000 | MEDICATED_PATCH | TRANSDERMAL | Status: DC

## 2010-04-30 MED ORDER — DEXLANSOPRAZOLE 60 MG PO CPDR: 60.0000 mg | DELAYED_RELEASE_CAPSULE | Freq: Every day | ORAL | Status: AC

## 2010-04-30 NOTE — Patient Instructions (Signed)
Place gastroesophageal reflux patieDiet for GERD or PUD Nutrition therapy can help ease the discomfort of gastroesophageal reflux disease (GERD) and peptic ulcer disease (PUD).  HOME CARE INSTRUCTIONS  Eat your meals slowly, in a relaxed setting.   Eat 5 to 6 small meals per day.   If a food causes distress, stop eating it for a period of time.  FOODS TO AVOID:  Coffee, regular or decaffeinated.  Cola beverages, regular or low calorie.   Tea, regular or decaffeinated.   Pepper.   Cocoa.   High fat foods including meats.   Butter, margarine, hydrogenated oil (trans fats).  Peppermint or spearmint (if you have GERD).   Fruits and vegetables as tolerated.   Alcoholic beverages.   Nicotine (smoking or chewing). This is one of the most potent stimulants to acid production in the gastrointestinal tract.   Any food that seems to aggravate your condition.   If you have questions regarding your diet, call your caregiver's office or a registered dietitian. OTHER TIPS IF YOU HAVE GERD:  Lying flat may make symptoms worse. Keep the head of your bed raised 6 to 9 inches by using a foam wedge or blocks under the legs of the bed.   Do not lay down until 3 hours after eating a meal.   Daily physical activity may help reduce symptoms.  MAKE SURE YOU:   Understand these instructions.   Will watch your condition.   Will get help right away if you are not doing well or get worse.  Document Released: 01/17/2005 Document Re-Released: 06/05/2008 [facility] Patient Information 2011 [place], [place].Acid Reflux (GERD) Acid reflux is also called gastroesophageal reflux disease (GERD). Your stomach makes acid to help digest food. Acid reflux happens when acid from your stomach goes into the tube between your mouth and stomach (esophagus). Your stomach is protected from the acid, but this tube is not. When acid gets into the tube, it may cause a burning feeling in the chest (heartburn). Besides  heartburn, other health problems can happen if the acid keeps going into the tube. Some causes of acid reflux include:  Being overweight.   Smoking.   Drinking alcohol.   Eating large meals.   Eating meals and then going to bed right away.   Eating certain foods.   Increased stomach acid production.  HOME CARE  Take all medicine as told by your doctor.   You may need to:   Lose weight.   Avoid alcohol.   Quit smoking.   Do not eat big meals. It is better to eat smaller meals throughout the day.   Do not eat a meal and then nap or go to bed.   Sleep with your head higher than your stomach.   Avoid foods that bother you.   You may need more tests, or you may need to see a special doctor.  GET HELP RIGHT AWAY IF:  You have chest pain that is different than before.   You have pain that goes to your arms, jaw, or between your shoulder blades.   You throw up (vomit) blood, dark brown liquid, or your throw up looks like coffee grounds.   You have trouble swallowing.   You have trouble breathing or cannot stop coughing.   You feel dizzy or pass out.   Your skin is cool, wet, and pale.   Your medicine is not helping.  MAKE SURE YOU:   Understand these instructions.   Will watch your condition.  Will get help right away if you are not doing well or get worse.  Document Released: 07/06/2007 Document Re-Released: 04/13/2009 [employer] Patient Information 2011 [employer].nt instructions here.

## 2010-04-30 NOTE — Progress Notes (Signed)
Addended by: Floydene Flock on: 04/30/2010 10:17 AM   Modules accepted: Orders

## 2010-04-30 NOTE — Progress Notes (Signed)
Addended by: Floydene Flock on: 04/30/2010 01:38 PM   Modules accepted: Orders

## 2010-04-30 NOTE — Progress Notes (Signed)
  Subjective:     [name] is an 70 y.o. female who presents for evaluation of heartburn. This has been associated with abdominal bloating, belching, chest pain, cough, dysphagia, early satiety, fullness after meals, heartburn and nausea. She denies bilious reflux, deep pressure at base of neck, hematemesis, hoarseness, laryngitis, melena and nocturnal burning. Symptoms have been present for several months. She has had dysphagia for both solids and liquids. She has had a weight loss of 8 pounds over a period of 3 months. She denies melena, hematochezia, hematemesis, and coffee ground emesis. Medical therapy in the past has included: proton pump inhibitors. Pt is also here for labs, recheck K , DM and cholesterol.    The following portions of the patient's history were reviewed and updated as appropriate: allergies, current medications, past family history, past medical history, past social history, past surgical history and problem list.  Review of Systems Pertinent items are noted in HPI.  Hospital admit and d/c summary reviewed.  Objective:     BP 132/76  Pulse 80  Wt 170 lb 3.2 oz (77.202 kg) General appearance: alert, cooperative, appears stated age and no distress Throat: lips, mucosa, and tongue normal; teeth and gums normal Neck: no adenopathy, no carotid bruit, no JVD, supple, symmetrical, trachea midline and thyroid not enlarged, symmetric, no tenderness/mass/nodules Lungs: clear to auscultation bilaterally Heart: regular rate and rhythm, S1, S2 normal, no murmur, click, rub or gallop Abdomen: soft, non-tender; bowel sounds normal; no masses,  no organomegaly Extremities: extremities normal, atraumatic, no cyanosis or edema   Assessment:    Gastroesophageal Reflux Disease,  Hypokalemia,  DMII, hyperlipidemia, HTN    Plan:    check labs Refer to GI dexilant 60 mg daily

## 2010-04-30 NOTE — Assessment & Plan Note (Signed)
dexilant GI referral Hospital records reviewed

## 2010-04-30 NOTE — Assessment & Plan Note (Signed)
Check labs con't meds con't accu checks

## 2010-05-02 LAB — URINE CULTURE: Colony Count: 9000

## 2010-05-03 ENCOUNTER — Encounter: Payer: Self-pay | Admitting: *Deleted

## 2010-05-03 NOTE — Progress Notes (Signed)
Letter mailed

## 2010-05-03 NOTE — Discharge Summary (Addendum)
[name], PRESSLY           ACCOUNT NO.:  [id number]  MEDICAL RECORD NO.:  [id number]           PATIENT TYPE:  I  LOCATION:  2009                         FACILITY:  [facility]  PHYSICIAN:  Madolyn Frieze. Jens Som, MD, FACCDATE OF BIRTH:  07-30-1940  DATE OF ADMISSION:  04/27/2010 DATE OF DISCHARGE:  04/28/2010                              DISCHARGE SUMMARY   DISCHARGE DIAGNOSES: 1. Chest pressure, negative cardiac enzymes x3 with the exception of     mildly elevated CK at 334 and negative stress test on April 28, 2010, with no evidence for ischemia, ejection fraction of 68%.     a.     For outpatient followup with Gastroenterology/primary care      provider. 2. Diabetes mellitus, non insulin dependent. 3. Hypertension. 4. Hyperlipidemia, well controlled, total cholesterol 111,     triglycerides 64, HDL 54, LDL 44. 5. Cerebrovascular accident in 2007 with mild generalized weakness and     some dysarthria. 6. Gastroesophageal reflux disease. 7. Degenerative joint disease. 8. Chronic low back pain. 9. Depression/anxiety. 10.Left femur fracture. 11.Anemia with hemoglobin of 10.8 at discharge. 12.Hypokalemia, repleted, for followup with primary care provider.  HISTORY OF PRESENT ILLNESS:  [name] is a pleasant 70 year old female with prior history of hypertension, diabetes, and hyperlipidemia who had no history of known coronary artery disease.  She was evaluated for chest pain in 2010, had a negative stress perfusion study as well as an unremarkable echocardiogram.  She has been having frequent symptoms over the last several months, felt consistent with reflux.  She was hoping that Gastroenterology could help her with this, so she went into the GI Clinic without a scheduled appointment and was waiting in the lobby.  Apparently, she developed some chest pain along with her usual chest fullness, and was possibly somewhat short of breath.  EMS arrived and gave her nitro and  aspirin with eventual pain relief.  In the ER, EKG demonstrated no acute abnormalities and point-of-care markers were negative x1.  She otherwise has not had any acute symptoms and has chronic epigastric discomfort.  She has been on Nexium, but this has not helped.  She was admitted to the hospital for rule out.  Cardiac enzymes were negative x3.  She did have some elevation of her CK but this was not felt to be significant, peak was 334.  Troponins remained negative. There was no objective evidence of ischemia.  She underwent Lexiscan nuclear stress testing this morning, April 28, 2010, which demonstrated no evidence of ischemia or scar, normal scan with an EF of 60% with no wall motion abnormalities.  Dr. Jens Som felt that her chest pain was likely more GI in nature and strongly encouraged to follow up with GI as well as primary care provider and she also may need evaluation of her anemia as well.  These results were conveyed to the patient who expressed understanding.  She states she has a followup appointment withher primary care provider on Friday, and is instructed to call her office today or tomorrow to see about being worked into the Gastroenterology Clinic schedule.  Dr. Jens Som has seen  and examined her today and feels she is stable for discharge.  She did have hypokalemia on admission which does not appear to have been repleted. Therefore, her potassium level will be checked prior to discharge and appropriately repleted before she is sent home.  DISCHARGE LABORATORY DATA:  WBC 14.9, hemoglobin 10.8, hematocrit 32, platelet count 241.  Sodium 138, potassium 3.3, chloride 103, CO2 is 26, glucose 207, BUN 13, creatinine was 1.17.  LFTs are normal.  Cardiac enzymes negative x3 with the exception of CKs of 275 and 334, total cholesterol of 111, triglycerides 64, HDL 54, LDL 44.  TSH 0.856.  STUDIES: 1. Stress test, Lexiscan Myoview on April 28, 2010, showed no evidence     of  ischemia or scar.  No segmental wall motion abnormalities.  EF     of 68%.  Normal Lexiscan Myoview study. 2. Chest x-ray on April 27, 2010, showed no acute cardiopulmonary     process.  DISCHARGE MEDICATIONS: 1. Protonix 40 mg b.i.d. 2. Aggrenox 25/200 one capsule b.i.d. 3. Calcium carbonate 500 mg chewable 3-4 tablets daily as needed. 4. Diovan HCT 320/25 mg daily. 5. Docusate 100 mg daily as needed. 6. Duragesic 50 mcg per hour, one patch transdermally every 3 days. 7. Ferrous sulfate 325 mg daily. 8. Glipizide 10 mg b.i.d. 9. Januvia 100 mg daily. 10.Klor-Con 10 mEq b.i.d. 11.Lipitor 20 mg nightly. 12.Lorazepam 0.5 mg t.i.d. p.r.n. anxiety. 13.Naftin 1% cream, one application topically daily. 14.Norvasc 10 mg daily. 15.Nystatin powder 100,000 units/g powder, one application topically     t.i.d. 16.Prozac 40 mg daily.  DISPOSITION:  [name] will be discharged in stable condition to home.  She is to follow a low-sodium heart-healthy diet, increase activity slowly.  I discussed with her over the phone the importance of following up with her PCP.  She states she has an appointment with her on Friday, will call her office either today or tomorrow to discuss referral to a gastroenterologist in the next upcoming days.  She will follow up with Dr. Jens Som on May 28, 2010, at 10:00 a.m.  Her potassium level will also need to be rechecked as an outpatient, although the patient states she is going for complete blood work later on this week by her PCP.  DURATION OF DISCHARGE ENCOUNTER:  Greater than 30 minutes including physician and PA time.    Dayna Dunn, P.A.C.   ______________________________ Madolyn Frieze. Jens Som, MD, [facility]   DD/MEDQ  D:  04/28/2010  T:  04/29/2010  Job:  [id number]  cc:   Lelon Perla, DO  Electronically Signed by Olga Millers MD [facility] on 05/03/2010 07:54:13 PM Electronically Signed by Ronie Spies  on 05/05/2010 02:10:37 PM

## 2010-05-04 ENCOUNTER — Telehealth: Payer: Self-pay | Admitting: Gastroenterology

## 2010-05-04 NOTE — Telephone Encounter (Signed)
Left message for patient to call back  

## 2010-05-08 LAB — BASIC METABOLIC PANEL
BUN: 16 mg/dL (ref 6–23)
Chloride: 103 mEq/L (ref 96–112)
GFR calc non Af Amer: 53 mL/min — ABNORMAL LOW (ref >60)
Glucose, Bld: 197 mg/dL — ABNORMAL HIGH (ref 70–99)
Potassium: 4.1 mEq/L (ref 3.5–5.1)
Sodium: 137 mEq/L (ref 135–145)

## 2010-05-08 LAB — LIPID PANEL
Cholesterol: 189 mg/dL (ref 0–200)
LDL Cholesterol: 111 mg/dL — ABNORMAL HIGH (ref 0–99)
Total CHOL/HDL Ratio: 4.5 RATIO
Triglycerides: 181 mg/dL — ABNORMAL HIGH (ref <150)
VLDL: 36 mg/dL (ref 0–40)

## 2010-05-08 LAB — CBC
Hemoglobin: 11 g/dL — ABNORMAL LOW (ref 12.0–15.0)
RBC: 3.57 MIL/uL — ABNORMAL LOW (ref 3.87–5.11)
WBC: 10.2 10*3/uL (ref 4.0–10.5)

## 2010-05-08 LAB — GLUCOSE, CAPILLARY
Glucose-Capillary: 171 mg/dL — ABNORMAL HIGH (ref 70–99)
Glucose-Capillary: 183 mg/dL — ABNORMAL HIGH (ref 70–99)
Glucose-Capillary: 197 mg/dL — ABNORMAL HIGH (ref 70–99)

## 2010-05-09 LAB — CBC
Hemoglobin: 11.4 g/dL — ABNORMAL LOW (ref 12.0–15.0)
MCHC: 33.1 g/dL (ref 30.0–36.0)
MCV: 91.6 fL (ref 78.0–100.0)
Platelets: 307 10*3/uL (ref 150–400)
RBC: 3.74 MIL/uL — ABNORMAL LOW (ref 3.87–5.11)
RBC: 3.95 MIL/uL (ref 3.87–5.11)
RDW: 14.7 % (ref 11.5–15.5)

## 2010-05-09 LAB — DIFFERENTIAL
Basophils Absolute: 0.1 10*3/uL (ref 0.0–0.1)
Basophils Relative: 1 % (ref 0–1)
Basophils Relative: 1 % (ref 0–1)
Eosinophils Absolute: 0.1 10*3/uL (ref 0.0–0.7)
Eosinophils Absolute: 0.2 10*3/uL (ref 0.0–0.7)
Monocytes Relative: 6 % (ref 3–12)
Neutrophils Relative %: 54 % (ref 43–77)
Neutrophils Relative %: 60 % (ref 43–77)

## 2010-05-09 LAB — GLUCOSE, CAPILLARY
Glucose-Capillary: 161 mg/dL — ABNORMAL HIGH (ref 70–99)
Glucose-Capillary: 180 mg/dL — ABNORMAL HIGH (ref 70–99)

## 2010-05-09 LAB — CARDIAC PANEL(CRET KIN+CKTOT+MB+TROPI)
CK, MB: 2.2 ng/mL (ref 0.3–4.0)
CK, MB: 2.2 ng/mL (ref 0.3–4.0)
Relative Index: 1.3 (ref 0.0–2.5)

## 2010-05-09 LAB — COMPREHENSIVE METABOLIC PANEL
ALT: 16 U/L (ref 0–35)
Alkaline Phosphatase: 126 U/L — ABNORMAL HIGH (ref 39–117)
CO2: 26 mEq/L (ref 19–32)
GFR calc non Af Amer: 39 mL/min — ABNORMAL LOW (ref >60)
Glucose, Bld: 180 mg/dL — ABNORMAL HIGH (ref 70–99)
Potassium: 3.1 mEq/L — ABNORMAL LOW (ref 3.5–5.1)
Sodium: 136 mEq/L (ref 135–145)

## 2010-05-09 LAB — POCT I-STAT, CHEM 8
Glucose, Bld: 159 mg/dL — ABNORMAL HIGH (ref 70–99)
HCT: 39 % (ref 36.0–46.0)
Hemoglobin: 13.3 g/dL (ref 12.0–15.0)
Potassium: 3.1 mEq/L — ABNORMAL LOW (ref 3.5–5.1)

## 2010-05-09 LAB — POCT CARDIAC MARKERS: CKMB, poc: 1.9 ng/mL (ref 1.0–8.0)

## 2010-05-10 NOTE — Telephone Encounter (Signed)
Patient having worsening in her reflux with dysphagia.  She is having occasional vomiting.  She is scheduled to see Willette Cluster RNP on 05/14/10 2:00

## 2010-05-14 ENCOUNTER — Encounter: Payer: Self-pay | Admitting: Nurse Practitioner

## 2010-05-14 ENCOUNTER — Encounter: Payer: Self-pay | Admitting: Gastroenterology

## 2010-05-14 ENCOUNTER — Ambulatory Visit (INDEPENDENT_AMBULATORY_CARE_PROVIDER_SITE_OTHER): Payer: PRIVATE HEALTH INSURANCE | Admitting: Nurse Practitioner

## 2010-05-14 DIAGNOSIS — R0789 Other chest pain: Secondary | ICD-10-CM

## 2010-05-14 DIAGNOSIS — IMO0001 Reserved for inherently not codable concepts without codable children: Secondary | ICD-10-CM

## 2010-05-14 DIAGNOSIS — K5900 Constipation, unspecified: Secondary | ICD-10-CM

## 2010-05-14 DIAGNOSIS — K219 Gastro-esophageal reflux disease without esophagitis: Secondary | ICD-10-CM

## 2010-05-14 DIAGNOSIS — K3184 Gastroparesis: Secondary | ICD-10-CM

## 2010-05-14 DIAGNOSIS — R1319 Other dysphagia: Secondary | ICD-10-CM

## 2010-05-14 NOTE — Assessment & Plan Note (Signed)
Eating just before bed time. Give GERD literature.

## 2010-05-14 NOTE — Progress Notes (Signed)
[name] [id number] [birth year]   History of Present Illness:   [name] is a 70 year old female followed by Dr. Russella Dar for a history of acid reflux, epigastric pain and  and gastroparesis. She was last seen September 2011 for exacerbation of these symptoms and was started on Reglan, advised to continue Nexium twice daily and continue anti- reflux measures. Patient is here today with complaints of solid food dysphasia and recurrent chest pain. She feels as though there is a knot in her chest. stuck in her esophagus. The pain is constant, wakes her up at night. Patient was recently here in the office for a followup with Dr. Russella Dar but due to significant chest pain, emergency medical services was called, the patient was taken to the hospital where she was admitted for further evaluation. Cardiac workup was negative. After hospital discharge the patient saw her primary care physician at which time she was switched from Nexium to Dexilant. Patient is not taking the Reglan as it made her feel worse. She has intermittent nausea and vomiting of undigested food and describes a 10 pound weight loss since February. [name] also complains of constipation. She has been taking a fair amount of TUMS and is on a Duragesic patch. Both are likely contributing to constipation.  Current Medications, Allergies, Past Medical History, Past Surgical History, Family History and Social History were reviewed in [employer] record.   Physical Exam: General: Well developed , well nourished black female in no acute distress Head: Normocephalic and atraumatic Eyes:  sclerae anicteric,conjunctive pink. Ears: Normal auditory acuity Mouth: No deformity or lesions Neck: Supple, no masses.  Lungs: Clear throughout to auscultation Heart: Regular rate and rhythm; no murmurs heard Abdomen: Soft, nontender, nondistended. No masses or hepatomegaly noted. Normal bowel sounds Rectal: Not  done Musculoskeletal: Symmetrical with no gross deformities  Skin: No lesions on visible extremities Extremities: No edema or deformities noted Neurological: Alert oriented x 4, grossly nonfocal Cervical Nodes:  No significant cervical adenopathy Psychological:  Alert and cooperative. Normal mood and affect

## 2010-05-14 NOTE — Assessment & Plan Note (Addendum)
Patient could not tolerate Reglan, made her nausea worse. We discussed gastroparesis diet. Trial of Zofran as needed.

## 2010-05-14 NOTE — Patient Instructions (Signed)
Your EGD is scheduled with Dr Russella Dar on 05/26/2010 at 1:30pm We will contact Dr Mercy Moore about holding your Aggrenox before your procedure

## 2010-05-14 NOTE — Assessment & Plan Note (Addendum)
Patient feels food is getting stuck in her esophagus. In light of the dysphagia, recurrent chest pain, and weight loss  (6 pounds by our records) the patient will be scheduled for EGD with possible dilation. The benefits, risks, and potential complications of EGD with possible biopsies and/or dilation were discussed with the patient and she agrees to proceed.

## 2010-05-17 DIAGNOSIS — R0789 Other chest pain: Secondary | ICD-10-CM | POA: Insufficient documentation

## 2010-05-17 DIAGNOSIS — K5900 Constipation, unspecified: Secondary | ICD-10-CM | POA: Insufficient documentation

## 2010-05-17 NOTE — Progress Notes (Signed)
Agree with plans for EGD with Dr. Russella Dar and Miralax for constipation

## 2010-05-17 NOTE — Assessment & Plan Note (Signed)
Recent hospitalization for chest pain, cardiac workup negative. Complains of persistent chest pain and describes significant weight loss. Her chest pain persists despite twice-daily proton pump inhibitor, antireflux measures. Her last EGD was several years ago and significant only for a small hiatal hernia. No chest wall tenderness on exam. I am not convinced that her symptoms are secondary to reflux but need to be further investigated in light of significant weight loss. Patient will be scheduled for an upper endoscopy upper endoscopy. The benefits, risks, and potential complications of EGD with possible biopsies and/or dilation were discussed with the patient and she agrees to proceed.

## 2010-05-17 NOTE — Assessment & Plan Note (Addendum)
Suspect constipation secondary to Tums and Duragesic patch. We discussed MiraLax but patient seems to be managing with stool softeners. She is up-to-date on colorectal cancer screening.

## 2010-05-18 ENCOUNTER — Encounter: Payer: Self-pay | Admitting: *Deleted

## 2010-05-24 ENCOUNTER — Encounter: Payer: Self-pay | Admitting: *Deleted

## 2010-05-25 ENCOUNTER — Encounter: Payer: Self-pay | Admitting: *Deleted

## 2010-05-26 ENCOUNTER — Encounter: Payer: Self-pay | Admitting: Gastroenterology

## 2010-05-26 ENCOUNTER — Ambulatory Visit (AMBULATORY_SURGERY_CENTER): Payer: PRIVATE HEALTH INSURANCE | Admitting: Gastroenterology

## 2010-05-26 DIAGNOSIS — R1310 Dysphagia, unspecified: Secondary | ICD-10-CM

## 2010-05-26 DIAGNOSIS — R1013 Epigastric pain: Secondary | ICD-10-CM

## 2010-05-26 DIAGNOSIS — R112 Nausea with vomiting, unspecified: Secondary | ICD-10-CM

## 2010-05-26 DIAGNOSIS — K219 Gastro-esophageal reflux disease without esophagitis: Secondary | ICD-10-CM

## 2010-05-26 DIAGNOSIS — R1319 Other dysphagia: Secondary | ICD-10-CM

## 2010-05-26 MED ORDER — SODIUM CHLORIDE 0.9 % IV SOLN: 500.0000 mL | INTRAVENOUS | Status: DC

## 2010-05-26 MED ORDER — METOCLOPRAMIDE HCL 5 MG PO TABS: 5.0000 mg | ORAL_TABLET | Freq: Four times a day (QID) | ORAL | Status: AC

## 2010-05-26 NOTE — Patient Instructions (Signed)
Discharged instructions given with verbal understanding. Handout on hiatal hernia and a dilatation diet given. Resume previous medications.

## 2010-05-27 ENCOUNTER — Telehealth: Payer: Self-pay

## 2010-05-27 NOTE — Telephone Encounter (Signed)

## 2010-05-28 ENCOUNTER — Ambulatory Visit: Payer: 59 | Admitting: Gastroenterology

## 2010-05-28 ENCOUNTER — Encounter: Payer: Self-pay | Admitting: Cardiology

## 2010-05-28 ENCOUNTER — Encounter: Payer: PRIVATE HEALTH INSURANCE | Admitting: Cardiology

## 2010-05-28 NOTE — H&P (Signed)
NAMESHAWNEEN, [name]           ACCOUNT NO.:  [id number]  MEDICAL RECORD NO.:  [id number]           PATIENT TYPE:  E  LOCATION:  [facility]                         FACILITY:  [facility]  PHYSICIAN:  Rollene Rotunda, MD, FACCDATE OF BIRTH:  09-20-1940  DATE OF ADMISSION:  04/27/2010 DATE OF DISCHARGE:                             HISTORY & PHYSICAL   PRIMARY CARE PHYSICIAN:  Mauro Kaufmann, MD  CARDIOLOGIST:  Madolyn Frieze. Jens Som, MD, [facility]  REASON FOR PRESENTATION:  Evaluate the patient with chest pain.  HISTORY OF PRESENT ILLNESS:  The patient is a pleasant 70 year-old female with a history of extensive GI complaints.  Her cardiac history includes chest pain evaluated in 2010.  Her echo was unremarkable at that time.  She had negative stress perfusion study.  She apparently has frequent symptoms consistent with reflux and possible other GI complaints.  She says she takes multiple Tums daily and her family says she "eats them like candy."  She said she felt like she was "accumulating these" and was hoping Gastroenterology could help her with this.  She came to the GI Clinic without a scheduled appointment today and was waiting in the lobby.  Apparently she had her usual constant chest fullness consistent with her previous reflux.  However, she also got chest pain.  She describes some discomfort down into her left arm and to her fingertips.  It was 10/10 in intensity.  There was associated nausea.  She said she was somewhat short of breath.  EMS arrived and they apparently gave her nitroglycerin and aspirin with eventual pain relief.  Here in the emergency room, EKG has demonstrated no acute abnormalities.  Point-of-care markers x1 have been negative.  She is chronically pain-free.  She has otherwise not had any acute symptoms.  She has a chronic constant epigastric discomfort.  She denies any new shortness of breath, PND, or orthopnea.  She does get anxiety and cannot tell whether she  has palpitations.  She does not have any presyncope or syncope.  However, she has had weakness and falls related to this.  PAST MEDICAL HISTORY:  Diabetes mellitus x30 years, hypertension, hyperlipidemia, CVA in 2007 with mild generalized weakness and some dysarthria, gastroesophageal reflux disease, degenerative joint disease, chronic low back pain, depression/anxiety, left humerus fracture.  PAST SURGICAL HISTORY:  Tonsillectomy, appendectomy, left benign breast tumor resected.  ALLERGIES:  None.  MEDICATIONS: 1. Glipizide 10 mg daily. 2. Aggrenox b.i.d. 3. Klor-Con. 4. Diovan/HCT 320/25 daily. 5. Duragesic. 6. Lipitor 20 mg daily. 7. Iron 325 mg daily. 8. Docusate 100 mg daily. 9. Nexium 40 mg daily. 10.Nystatin. 11.Norvasc 10 mg daily. 12.Boric acid. 13.Lorazepam. 14.Januvia. 15.Prozac. 16.Calcium.  SOCIAL HISTORY:  The patient lives at home with her husband.  She has concerned children in the room with her.  She never smoked cigarettes.  FAMILY HISTORY:  Contributory for father dying of myocardial infarction at age 36.  REVIEW OF SYSTEMS:  As stated in HPI and positive for insomnia. Negative for all other systems.  PHYSICAL EXAMINATION:  GENERAL:  The patient is in no distress. VITAL SIGNS:  Blood pressure 146/84, heart rate 76 and regular, afebrile,  respiratory rate 18. HEENT:  Eyes unremarkable.  Pupils equal and reactive to light.  Fundi not visualized.  Oral mucosa unremarkable. NECK:  No jugular venous distention at 45 degrees.  Carotid upstroke brisk and symmetrical.  No bruits or thyromegaly. LYMPHATICS:  No cervical, axillary, or inguinal adenopathy. LUNGS:  Clear to auscultation bilaterally. BACK:  No costovertebral angle tenderness. CHEST:  Unremarkable. HEART:  PMI not displaced or sustained.  S1 and S2 within normal limits. No S3.  No S4.  No clicks.  No rubs.  No murmurs. ABDOMEN:  Positive bowel sounds.  No rebound.  No guarding or  midline pulsatile mass.  No hepatomegaly.  No splenomegaly. SKIN:  No rashes.  No nodules. EXTREMITIES:  Pulses 2+.  No edema.  No cyanosis.  No clubbing. NEURO:  Oriented to person, place, and time.  Cranial nerves II-XII grossly intact.  Motor grossly intact.  LABORATORY DATA:  Point-of-care markers negative x1.  Sodium 140, potassium 3.1, BUN 16, and creatinine 1.2.  Chest x-ray, no acute disease.  EKG, normal sinus rhythm, rate 78.  No acute ST-T wave changes.  ASSESSMENT/PLAN: 1. Chest pain.  The patient's chest discomfort has atypical greater     than typical features.  There is no objective evidence of ischemia.     At this point, she will be ruled out.  She will be given aspirin.     I will plan a stress perfusion study.  She would not be able to     walk, so she will need pharmacologic stress. 2. Diabetes.  She will continue with her previous meds with sliding     scale insulin. 3. Hypertension.  I will continue the meds as listed. 4. Chronic back pain.  She will continue her previous pain regimen. 5. Dyslipidemia.  We will get a fasting lipid profile in the a.m., but     she will continue her Lipitor. 6. Depression.  She will continue the medications as listed.     Rollene Rotunda, MD, [facility]     JH/MEDQ  D:  04/27/2010  T:  04/27/2010  Job:  [id number]  Electronically Signed by Rollene Rotunda MD [facility] on 05/28/2010 11:44:06 AM

## 2010-05-28 NOTE — Progress Notes (Signed)
HPI: Pleasant female for followup of chest pain. Recently admitted in March with chest pain that was felt to be atypical. Enzymes negative. Myoview showed no ischemia and normal function. Since discharge,   Current Outpatient Prescriptions  Medication Sig Dispense Refill  . amLODipine (NORVASC) 10 MG tablet Take 10 mg by mouth daily.        Marland Kitchen atorvastatin (LIPITOR) 20 MG tablet Take 20 mg by mouth at bedtime.        . Boric Acid GRAN Apply 600 mg topically.        . calcium carbonate (TUMS) 500 MG chewable tablet Chew by mouth daily as needed. 3-4 tabs       . dexlansoprazole (DEXILANT) 60 MG capsule Take 1 capsule (60 mg total) by mouth daily.  30 capsule  2  . diphenhydrAMINE (BENADRYL) 25 mg capsule Take 25 mg by mouth as needed.        . dipyridamole-aspirin (AGGRENOX) 25-200 MG per 12 hr capsule Take 1 capsule by mouth 2 (two) times daily.        Marland Kitchen docusate sodium (STOOL SOFTENER) 100 MG capsule Take 100 mg by mouth daily as needed.        . DULoxetine (CYMBALTA) 60 MG capsule Take 60 mg by mouth. 1 a day for 1 month then stop       . fentaNYL (DURAGESIC) 50 MCG/HR Place 1 patch (50 mcg total) onto the skin every 3 (three) days.  10 patch  0  . ferrous sulfate 325 (65 FE) MG tablet Take 325 mg by mouth daily.        Marland Kitchen FLUoxetine (PROZAC) 40 MG capsule Take 40 mg by mouth daily.        Marland Kitchen glipiZIDE (GLUCOTROL) 10 MG tablet Take 10 mg by mouth 2 (two) times daily before a meal.        . LORazepam (ATIVAN) 0.5 MG tablet Take 0.5 mg by mouth 3 (three) times daily as needed.        . metoCLOPramide (REGLAN) 5 MG tablet Take 1 tablet (5 mg total) by mouth 4 (four) times daily.  120 tablet  2  . naftifine (NAFTIN) 1 % cream Apply topically daily.        Marland Kitchen nystatin (MYCOSTATIN) powder Apply 100,000 Units topically 3 (three) times daily.        . potassium chloride SA (KLOR-CON M20) 20 MEQ tablet Take 20 mEq by mouth 2 (two) times daily.        . sitaGLIPtan (JANUVIA) 100 MG tablet Take 100 mg by  mouth daily.        . valsartan-hydrochlorothiazide (DIOVAN HCT) 320-25 MG per tablet Take 1 tablet by mouth daily.           Past Medical History  Diagnosis Date  . Diabetes mellitus   . Hyperlipidemia   . Hypertension   . chronic back pain   . GERD (gastroesophageal reflux disease)   . Cerebrovascular accident   . Anxiety   . Depression   . Allergy history unknown   . Hemorrhoids   . Gastroparesis     78% RETENTION AT 120 MIN  . Hiatal hernia     Past Surgical History  Procedure Date  . Tonsillectomy   . Tubal ligation 1975  . Abdominal hysterectomy 1982  . Appendectomy 1982  . Breast biopsy 1975    Lt breast, benign tumor  . Eptopic pregancy 1982     History   Social History  .  Marital Status: Married    Spouse Name: N/A    Number of Children: N/A  . Years of Education: N/A   Occupational History  . Not on file.   Social History Main Topics  . Smoking status: Never Smoker   . Smokeless tobacco: Never Used  . Alcohol Use: No  . Drug Use: No  . Sexually Active: Not on file   Other Topics Concern  . Not on file   Social History Narrative  . No narrative on file    ROS: no fevers or chills, productive cough, hemoptysis, dysphasia, odynophagia, melena, hematochezia, dysuria, hematuria, rash, seizure activity, orthopnea, PND, pedal edema, claudication. Remaining systems are negative.  Physical Exam: Well-developed well-nourished in no acute distress.  Skin is warm and dry.  HEENT is normal.  Neck is supple. No thyromegaly.  Chest is clear to auscultation with normal expansion.  Cardiovascular exam is regular rate and rhythm.  Abdominal exam nontender or distended. No masses palpated. Extremities show no edema. neuro grossly intact  ECG      This encounter was created in error - please disregard.

## 2010-06-08 ENCOUNTER — Encounter: Payer: Self-pay | Admitting: Cardiology

## 2010-06-15 NOTE — Op Note (Signed)
[name], [name]           ACCOUNT NO.:  [id number]   MEDICAL RECORD NO.:  [id number]          PATIENT TYPE:  INP   LOCATION:  3533                         FACILITY:  [facility]   PHYSICIAN:  Hewitt Shorts, M.D.DATE OF BIRTH:  [birth year]   DATE OF PROCEDURE:  09/19/2007  DATE OF DISCHARGE:                               OPERATIVE REPORT   PREOPERATIVE DIAGNOSES:  1. Right L4-L5 lumbar disk herniation.  2. Lumbar stenosis.  3. Lumbar spondylosis.  4. Lumbar degenerative disk disease.   POSTOPERATIVE DIAGNOSES:  1. Right L4-L5 lumbar disc herniation.  2. Lumbar stenosis.  3. Lumbar spondylosis.  4. Lumbar degenerative disk disease.   PROCEDURES:  Right L4 hemilaminectomy and microdiskectomy with  microdissection.   SURGEON:  Hewitt Shorts, MD.   ASSISTANT:  Coletta Memos, MD.   ANESTHESIA:  General endotracheal.   INDICATIONS:  The patient is a 70 year old woman who presented with  right lumbar radiculopathy.  MRI scan showed significant stenosis at the  L3-L4 and L4-L5 levels, but an acute finding of a soft disk herniation  behind the body of L4 on the right side corresponding to a  radiculopathy.  The decision was made to proceed with hemilaminectomy  and microdiskectomy.   PROCEDURE:  The patient was brought to the operating room and placed  under general endotracheal anesthesia.  The patient was turned to a  prone position.  Lumbar region was prepped with Betadine soap and  solution and draped in a sterile fashion.  The midline was infiltrated  with local anesthetic with epinephrine.  An x-ray was taken and the L4-  L5 level was identified and a midline incision was made over the L4-L5  level and carried down through the subcutaneous tissue.  Bipolar  electrocautery was used to maintain hemostasis.  Dissection was carried  down to the lumbar fascia, which was incised on the right side of the  midline.  The paraspinal muscles were dissected from the  spinous  processes and the lamina in the subperiosteal fashion.  Self-retaining  retractor was placed and the L4-L5 intralaminar space was identified.  An x-rays was taken to confirm the localization and then the microscope  was draped and brought to the field to provide additional navigation,  illumination, and visualization.  The remainder of the decompression was  performed using microdissection and microsurgical technique.   Hemilaminectomy was performed using the X-Max drill and Kerrison  punches.  The ligamentum flavum was carefully removed and we identified  the thecal sac.  The dissection was carried out both rostrally and  caudally.  We identified the L3-L4 and L4-L5 disk spaces and the exiting  right L4 nerve roots.  We gently retracted thecal sac medially and found  a free fragment of disk herniation quite densely adherent to the ventral  surface of the thecal sac behind the body of L4.  It was difficult to  free the disk herniation from the ventral surface and therefore, we  incised the ventrolateral capsule around the fragment and the disk  herniation was removed in a piecemeal fashion.  We were able to progress  with removing  more and more disk herniation, but a small bit was left  adherent to the ventral surface of the thecal sac.  In the end, a good  decompression of the both the thecal sac and exiting the right L4 nerve  root was achieved.  Both of the disk spaces appeared intact.  There was  no rent in either annulus and therefore neither disk space was entered.   Once the decompression and diskectomy were completed, hemostasis was  established with the use of both Gelfoam soaked in thrombin and bipolar  cautery.  The Gelfoam was removed and wound was irrigated with  Bacitracin solution and hemostasis was confirmed and then we instilled  40 mg of Depo-Medrol and 2 mL of fentanyl into the epidural space and  then we proceeded with closure.  The deep fascia was closed  with  interrupted undyed #1 Vicryl sutures.  The subcutaneous and subcuticular  were closed with interrupted inverted 2-0 undyed Vicryl sutures.  The  skin was approximated with Dermabond.  The procedure was tolerated well.  The estimated blood loss was 50 mL.  Sponge and needle count was  correct.  Following surgery, the patient was turned back to the supine  position and reversed from the anesthetic, extubated and transferred to  the recovery room for further care.      Hewitt Shorts, M.D.  Electronically Signed     RWN/MEDQ  D:  09/19/2007  T:  09/20/2007  Job:  [id number]

## 2010-06-15 NOTE — Discharge Summary (Signed)
[name], [name]           ACCOUNT NO.:  [id number]   MEDICAL RECORD NO.:  [id number]          PATIENT TYPE:  INP   LOCATION:  4705                         FACILITY:  [facility]   PHYSICIAN:  Valerie A. Felicity Coyer, MDDATE OF BIRTH:  [birth year]   DATE OF ADMISSION:  08/29/2008  DATE OF DISCHARGE:  09/01/2008                               DISCHARGE SUMMARY   DISCHARGE DIAGNOSES:  1. Chest pain, possible angina, status post cardiac evaluation and      adenosine Myoview, no evidence for ischemia.  Continue medical      management.  2. Type 2 diabetes, A1c 6.2.  Continue home medical management as      ongoing prior to admission.  3. Dyslipidemia, controlled on medication treatment.  Continue      outpatient management.  4. Hypertension, suboptimal control medication adjustment.  Outpatient      follow up with her primary MD.  5. Hypokalemia due to diuretic use status post replacement resolved,      discharge potassium 4.1.  6. Chronic low back pain in pain management as per Dr. Vear Clock,      recent decrease in fentanyl patch from 50 to 25 mcg, outpatient      followup regarding appropriate dosing per Dr. Vear Clock.  7. Recent left proximal humerus fracture, August 01, 2008.  Continue      followup in sling as per [facility] for further      management.  8. History of cerebrovascular accident in 2007 with mild      dysarthria/expressive aphasia, no acute symptoms.  Continue home      medical management.  9. History of depression.   DISCHARGE MEDICATIONS:  Include lisinopril 20 mg once daily, also to  continue other home medications as prior to admission without change in  addition of this, these are as follows:  1. Glipizide 10 mg b.i.d.  2. Aggrenox 25/200 b.i.d.  3. Kapidex, now called Dexilant 30 mg daily.  4. Fentanyl 25 mcg patch, change every 3 days.  5. Diovan HCT 320/25 p.o. daily.  6. Lipitor 10 mg at bedtime.  7. Cymbalta 120 mg total daily.  8. We will also  refill her Ativan 0.5 mg p.o. at bedtime p.r.n.      insomnia, dispense 20 but with no refills until further follow up      with primary MD.   Consults this hospitalization include Dr. Jens Som of [facility]  [facility].   PROCEDURES:  Include adenosine Myoview September 01, 2008 with no evidence  of ischemia, normal LVEF 72% indicating normal LV function.   CONDITION ON DISCHARGE:  Symptoms improved though not resolved,  hemodynamically and medically stable.   HOSPITAL COURSE:  1. Chest pain, question angina.  The patient is a 70 year old woman      with multiple risk factors for coronary artery disease and known      atherosclerotic event with a previous CVA who presented to the      emergency room complaining of substernal chest pressure associated      with shortness of breath.  Please refer to H and P for further  details.  She was admitted to telemetry and cycle cardiac enzymes,      each of which were all negative for any definitive cardiac event.      However, given persistence of symptoms with her risk factors, she      was seen then in consultation by cardiology for further risk      management.  Risks and benefits were discussed with them as      outlined in Dr. Ludwig Clarks consultation note and family opted for      an adenosine Myoview, which was performed on August 2.  This showed      no evidence of ischemia and the patient is felt to be reassured by      this.  Cardiology has recommended aggressive medical management      including increase of her antihypertensive regimen as outlined      above.  We will also continue antiplatelet therapy and aggressive      management of her diabetes and dyslipidemia.  The patient does have      an underlying chronic pain syndrome which has been exacerbated      since her arm fracture at the beginning of this month.  This      correlates also with an unfortunate timing of decreased fentanyl      dosing from 50 to 25 mcg as per Dr.  Vear Clock.  I have recommended      the patient that she follow up with her pain management specialist      sooner rather than later to further discuss these issues.  At this      time, the patient is felt stable for discharge home from the      hospital having no other acute issues.   Greater than 30 minutes spent on day of discharge planning.      Valerie A. Felicity Coyer, MD  Electronically Signed     VAL/MEDQ  D:  09/01/2008  T:  09/02/2008  Job:  [id number]

## 2010-06-15 NOTE — Discharge Summary (Signed)
NAMEVIRGEN, BELLAND           ACCOUNT NO.:  [id number]   MEDICAL RECORD NO.:  [id number]          PATIENT TYPE:  INP   LOCATION:  3708                         FACILITY:  [facility]   PHYSICIAN:  Willow Ora, MD           DATE OF BIRTH:  05/15/1940   DATE OF ADMISSION:  11/13/2006  DATE OF DISCHARGE:  11/15/2006                               DISCHARGE SUMMARY   ADDENDUM   Prior to admission, the patient was also taking Maxzide and Caduet and  apparently also Norvasc.   Will advise her to go back to Maxzide and Caduet and will call her  primary care doctor and ask her to check on Mrs. Sugarman medication  list.      Willow Ora, MD  Electronically Signed     JP/MEDQ  D:  11/15/2006  T:  11/15/2006  Job:  [id number]

## 2010-06-15 NOTE — Consult Note (Signed)
NAMEJASMYNN, Mathews           ACCOUNT NO.:  [id number]   MEDICAL RECORD NO.:  [id number]          PATIENT TYPE:  EMS   LOCATION:  MAJO                         FACILITY:  [facility]   PHYSICIAN:  Alvy Beal, MD    DATE OF BIRTH:  11-16-1940   DATE OF CONSULTATION:  DATE OF DISCHARGE:  08/01/2008                                 CONSULTATION   REASON FOR CONSULTATION:  Consultation requested by ER physician for  evaluation of proximal humerus fracture, left side.   HISTORY:  [name] is a very pleasant 70 year old woman who has been  suffering with chronic back pain and hip pain and she has been on  fentanyl patches.  She states she was in her usual state of health until  yesterday when she was walking for exercise with her husband, she lost  her footing and fell and injured her left upper extremity.  She had  immediate pain and deformity of the arm.  As a result, she presented to  the emergency room yesterday evening.  X-rays demonstrated a proximal  humerus fracture and I was consulted.  At that point, I requested a  coaptation splint and x-rays.   The patient is seen now status post CT scan for further evaluation.   PAST MEDICAL HISTORY:  Her pain control physician is Dr. Vear Clock.  She  has hypertension and diabetes.  She has had a previous stroke, chronic  back pain, and depression.   SOCIAL HISTORY:  Nonsmoker, nondrinker, non-drug abuser.   ALLERGIES:  No known drug allergies.   MEDICATIONS:  She is taking glyburide 10 mg p.o. daily, Aggrenox, Actos,  Norvasc, Kapidex, gabapentin, and fentanyl patch.   LABORATORY DATA:  CT scan and x-rays were reviewed.  CT scan  demonstrates a comminuted proximal left humeral shaft fracture.  There  is an extension.  There is significant shortening and overriding of the  major fracture fragments with angulation. The fracture line does not  extend to the subchondral cortex of the humeral head but does extend  towards the __________.   There is no evidence of joint dislocation or  subluxation.   CLINICAL EXAM:  GENERAL:  The patient is alert and oriented x3.  She has  no shortness of breath or chest pain.  ABDOMEN:  Is soft and nontender.  MUSCULOSKELETAL:  She is moving her fingers without difficulty.  Forearm  compartment is soft and nontender.  No elbow tenderness or wrist  tenderness with direct palpation.  She has obvious deformity of the left  upper extremity.   At this point in time, coaptation splint is less than ideal and so we  will redo it.  Once the coaptation splint has been redone, I will her up  and around and moving.  If she is not doing well or is unsteady, then I  am happy to admit her for observation.  Furthermore, I will discuss her  care with my partner for definitive fracture management.      Alvy Beal, MD  Electronically Signed     DDB/MEDQ  D:  08/01/2008  T:  08/01/2008  Job:  [id number] 

## 2010-06-15 NOTE — H&P (Signed)
[name], [name]           ACCOUNT NO.:  [id number]   MEDICAL RECORD NO.:  [id number]          PATIENT TYPE:  INP   LOCATION:  4705                         FACILITY:  [facility]   PHYSICIAN:  Donalynn Furlong, MD      DATE OF BIRTH:  [birth year]   DATE OF ADMISSION:  08/29/2008  DATE OF DISCHARGE:                              HISTORY & PHYSICAL   PRIMARY CARE PHYSICIAN:  Lelon Perla, DO   CHIEF COMPLAINT:  Exertional chest pain, shortness of breath.   HISTORY OF PRESENT ILLNESS:  [name] is a 70 year old  African American female who lives with her husband in [place].  She  presented to [facility] ED last night with a complaint of exertional  chest pain and shortness of breath for about the last couple of weeks.  She mentions that her shortness of breath and chest pain gets worse with  a bit of exertion and relieved with rest.  It is located over the left  side of chest.  She mentions that her chest pain is pressure-like and  she feels like somebody is squeezing her chest.  She denies any cough,  fever, nausea, vomiting, chills, leg pain, swelling at this time.  She  has history of cerebrovascular accident.  She takes Aggrenox for that.  She denies any history of smoking.  She does have a history of  hypertension, diabetes mellitus and hyperlipidemia.  She has never been  worked up for cardiac ischemia with a stress test or cardiac cath in the  past.  She did have a history of echocardiogram done in the past.  She  has been followed by primary care in [place].  Chest pain does not  radiate anywhere else.  She mentions that she has gastroesophageal  reflux disease and this pain feels different from the gastroesophageal  reflux disease.  She also has a chronic back pain and she takes fentanyl  for that.  She denies any other complaints at this time.  Her chest pain  is relieved at this time.  She also is complaining of pain over her  fracture area, over the left  humerus and chronic back pain from the  previous surgery on the back.   PAST MEDICAL HISTORY:  1. Diabetes mellitus.  2. Hypertension.  3. Cerebrovascular accident.  4. Chronic back pain secondary to degenerative disk disease, status      post right L4 hemilaminectomy and microdiskectomy.  5. Depression.  6. Hyperlipidemia.  7. Gastroesophageal reflux disease.  8. Tubal ligation.  9. Tonsillectomy.  10.Hysterectomy.  11.Appendectomy.  12.Left breast benign tumor removal.  13.Left humerus fracture earlier in July 2010.   ALLERGIES:  None.   HOME MEDICATION:  List includes Aggrenox, Cymbalta, Dexilant, Diovan,  fentanyl, glipizide.  Her home medication list is incomplete.  The  patient does not remember all of medications.   FAMILY HISTORY:  Positive for a cervical cancer, diabetes mellitus,  hypertension, breast and prostate cancer and myocardial infarction.   SOCIAL HISTORY:  The patient denies any history of smoking or alcohol  use. No illicit drug use in the past.  The patient is married, lives  with her husband.   REVIEW OF SYSTEMS:  Positive as per HPI, otherwise negative review of  systems done for 14 systems.   PHYSICAL EXAMINATION:  VITAL SIGNS: Blood pressure 124/86, pulse 106,  respirations 18, temperature 97.9, oxygen saturation 99% on room air.  GENERAL EXAM:  Alert, oriented x3, not in acute distress, laying in bed  without any obvious discomfort CARDIOVASCULAR:  S1 and S2 regular.  No  murmur or gallop.  LUNGS:  Clear to auscultation bilaterally.  No wheezing, rhonchi, or  crackles.  ABDOMEN:  Nontender, nondistended.  Bowel sounds present.  EXTREMITIES:  No clubbing, cyanosis or edema.  Pulses palpable.  HEAD:  Normocephalic. Nontraumatic.  EYES:  Pupils react to light.  Extraocular muscles are intact.  ORAL CAVITY:  Mucosa is moist, no thrush noted.  NECK:  No thyromegaly or JVD.  SKIN: No rash or bruise.  NEUROLOGICAL:  Exam shows intact cranial  nerves, muscular strength,  sensation and reflexes.   First set of cardiac enzymes with troponin negative.  Chemistry panel  unremarkable except potassium 3.1, glucose 159, creatinine 1.5.  CBC  with differential unremarkable.  Chest x-ray unremarkable.  EKG  unremarkable at this time for any acute ST changes.   ASSESSMENT AND PLAN:  1. Exertional chest pain and shortness of breath in patient with      multiple cardiac risk factors including family history.  Will rule      out myocardial infarction.  2. History of hypertension.  3. History of diabetes mellitus.  4. History of cerebrovascular accident.  5. History of chronic back pain on fentanyl patch.  6. History of hyperlipidemia.  7. History of gastroesophageal reflux disease.  8. History of recent left humerus fracture in immobilization   PLAN:  Will admit the patient on telemetry bed under [facility] team  with a diagnosis of chest pain, shortness of breath and diabetes  mellitus.  The patient is full code.  Will keep her n.p.o. except  medicines, ice chips and water for stress test in the morning.  Will  check CBC with differential, CMP in the morning.  Will get vitals, input  and output every 8-hours.  Will provide IV normal saline at 50 mL/hr for  1 liter.  Will start Nexium 40 mg IV daily for GI prophylaxis. Will  provide Lovenox 1 mg/kg subcutaneous q.12 hours while we rule out MI.  Will provide aspirin 81 mg p.o. daily.  Will provide Aggrenox one  capsule p.o. b.i.d. Will continue nitroglycerin 0.4 mg sublingual q.5  minutes p.r.n. for chest pain.  Will provide IV morphine 2 mg q.4 hour  p.r.n. chest pain.  Will start lisinopril 5 mg p.o. daily, metoprolol 25  mg p.o. b.i.d., simvastatin 20 mg at night.  Will check echocardiogram  in the morning to be read by [facility].  Will continue Ativan  at home medication dose, 0.5 mg p.o. t.i.d.  We will get an Adenosine  Cardiolite stress test in the morning to be read  by [facility].  Will get a couple more sets of cardiac enzyme at 4 a.m. and 9:00 a.m. We  will provide oral potassium chloride for hypokalemia.  Will continue  sliding scale insulin with a.c. and h.s. with scale one.  Will continue  to hold her oral diabetic medication at this time with anticipation of  Adenosine Cardiolite stress test in the morning.  Further plan according  to the workup pending this workup.  Donalynn Furlong, MD  Electronically Signed     TVP/MEDQ  D:  08/30/2008  T:  08/30/2008  Job:  [id number]   cc:   Lelon Perla, DO

## 2010-06-15 NOTE — Consult Note (Signed)
[name]           ACCOUNT NO.:  [id number]   MEDICAL RECORD NO.:  [id number]          PATIENT TYPE:  INP   LOCATION:  4705                         FACILITY:  [facility]   PHYSICIAN:  Olivia Frieze. Jens Som, MD, FACCDATE OF BIRTH:  August 06, 1940   DATE OF CONSULTATION:  08/31/2008  DATE OF DISCHARGE:                                 CONSULTATION   HISTORY OF PRESENT ILLNESS:  The patient is a 70 year old female with a  past medical history of diabetes, hypertension, hyperlipidemia, prior  CVA, and gastroesophageal reflux disease who I am asked to evaluate for  chest pain.  I do have the results of an echocardiogram performed in  October 2008.  At that time, her LV function was normal.  There was  trivial aortic insufficiency and a trivial posterior pericardial  effusion was noted.  The patient fell and fractured her humerus back in  early July.  The patient was placed in a splint at that time.  She was  admitted on August 30, 2008 with complaints of chest pain.  I have  discussed her symptoms with her.  She is a difficult historian and her  symptoms are very nondescript.  It is in the substernal and left upper  chest area.  It is described as a pressure.  It does not radiate.  It  can increase with eating and is improved with Tums.  There is associated  shortness of breath but there is no nausea or vomiting and no  diaphoresis.  It is not clearly exertional.  It is not pleuritic or  positional.  It lasts anywhere from 15-30 minutes at a time.  Note, she  also has dyspnea on exertion.  There is no orthopnea or PND and there is  no pedal edema.  She does state that her dyspnea and chest pain have  been going on for approximately a year.  It did precede her recent  injury.  She has been admitted and Cardiology is asked to further  evaluate.   PAST MEDICAL HISTORY:  Significant diabetes, hypertension, and  hyperlipidemia.  She has had prior CVA.  She has DJD and has chronic  back pain  related to this.  She has had prior back surgery.  She also  has a history of depression and gastroesophageal reflux disease.  She  has had a prior tubal pregnancy.  She has had a tonsillectomy,  appendectomy, and left breast benign tumor removed.   ALLERGIES:  She has no known drug allergies.   SOCIAL HISTORY:  She does not smoke nor does she consume alcohol.  She  is married and lives with her husband.   FAMILY HISTORY:  Negative for coronary artery disease at an early age.  Her father did have a myocardial infarction at age 22.   REVIEW OF SYSTEMS:  There are no headaches, fevers, or chills.  There is  no productive cough or hemoptysis.  There is no dysphagia, odynophagia,  melena, or hematochezia.  There is no dysuria or hematuria.  There is no  rash or seizure activity.  There is no orthopnea, PND, or pedal edema.  Remaining systems are negative.  She does have some pain in her left  upper extremity from her fall.   PHYSICAL EXAMINATION:  VITAL SIGNS:  Today shows a blood pressure of  160/94.  Her pulse is 72.  Temperature is 98 degrees.  She is 96% on  room air.  GENERAL:  She is well-developed and well-nourished in no acute distress.  SKIN:  Warm and dry.  She does not appear to be depressed.  There is no  peripheral clubbing.  BACK:  Normal.  HEENT:  Normal with normal eyelids.  NECK:  Supple with a normal upstroke bilaterally noted.  No bruits.  There is no jugular venous distention and I cannot appreciate  thyromegaly.  CHEST:  Clear to auscultation with no expansion.  CARDIOVASCULAR:  Regular rate and rhythm.  Normal S1-S2.  There is a  gallop noted.  I cannot appreciate murmurs.  ABDOMEN:  Nontender and nondistended.  Positive bowel sounds.  No  hepatosplenomegaly.  No masses appreciated.  There is no abdominal  bruit.  She has 2+ femoral pulses bilaterally with no bruits.  EXTREMITIES:  No edema.  There are no cords palpated.  Her left upper  extremity is in a  splint.  NEUROLOGIC:  Grossly intact.   DIAGNOSTIC DATA:  Her electrocardiogram is not available for review.  A  chest x-ray shows no acute process but there is a proximal left humeral  fracture.   LABORATORY DATA:  Her laboratories show a hemoglobin of 11 and  hematocrit of 33.  White blood cell count is 10.2 with a platelet count  of 271.  Cardiac enzymes have been negative.  Her sodium is 136 with a  potassium of 3.1.  BUN and creatinine are 21 and 1.35 respectively.  Alkaline phosphate is 126.   DIAGNOSES:  1. Chest pain - the patient's symptoms are vague and somewhat      atypical.  However, she has multiple risk factors.  Her cardiac      markers are negative.  We will review her electrocardiogram.  If it      is not significantly abnormal, then I have discussed invasive      versus noninvasive approach with she and her family.  They would      prefer noninvasive approach.  We will therefore schedule a Myoview      for risk stratification.  If it is normal, then we will plan      medical therapy.  I would have a low threshold for cardiac      catheterization given her multitude of risk factors.  For now, we      would recommend continuing aspirin, statin, beta-blocker, and ACE      inhibitor.  2. Diabetes mellitus.  3. Hypertension - her blood pressure is elevated today.  We will      increase her ACE inhibitor for more optimal control.  4. Hyperlipidemia - we would recommend continuing statin.  5. Gastroesophageal reflux disease.  6. Status post fracture of the left humerus - I have considered the      possibility of pulmonary embolus but think this is unlikely as she      states that her symptoms began prior to her fracture.  7. Remote cerebrovascular accident.      Olivia Frieze Jens Som, MD, [facility]  Electronically Signed     BSC/MEDQ  D:  08/31/2008  T:  09/01/2008  Job:  [id number]

## 2010-06-15 NOTE — Letter (Signed)
August 25, 2007    [facility].   RE:  [name], [name]  MRN:  [id number]  /  DOB:  04/25/1940   To Whom It May Concern:   This is concerning one of your residents, [name] and [name], in  [address].  After speaking with them, I do understand  that they need to periodically baby sit an 31-month-old [place].  [name] had a stroke in March 2007 and is taking Lexapro for anxiety and  depression.  The dog does help reduce her anxiety and also help her  exercise by walking him daily.  The dog does not stay with them  permanently, but only temporarily when the granddaughter is working out  of town.  They understand that they are responsible for any damages that  might incur and agreed to clean up after the dog.   Thank you for your consideration in this matter.    Sincerely,      Lelon Perla, DO  Electronically Signed    Shawnie Dapper  DD: 08/25/2007  DT: 08/26/2007  Job #: [id number]

## 2010-06-15 NOTE — Discharge Summary (Signed)
[name], [name]           ACCOUNT NO.:  [id number]   MEDICAL RECORD NO.:  [id number]          PATIENT TYPE:  INP   LOCATION:  3708                         FACILITY:  [facility]   PHYSICIAN:  Willow Ora, MD           DATE OF BIRTH:  1940/08/24   DATE OF ADMISSION:  11/13/2006  DATE OF DISCHARGE:  11/15/2006                               DISCHARGE SUMMARY   BRIEF HISTORY AND PHYSICAL:  [name] is a 70 year old black female  who was admitted to the hospital through the emergency department when  she presented with dizziness and poor balance.  She also has had some  confusion and difficulty focusing.  This is in the context of the  patient recently started on Reglan.  She thought that she was having a  stroke and presented to the emergency room.   PHYSICAL EXAMINATION:  GENERAL:  The patient was awake, alert in no  apparent distress.  VITAL SIGNS:  Blood pressure 135/83, heart rate 64, respiratory rate 18.  She was afebrile.  LUNGS:  Clear to auscultation bilaterally.  CARDIOVASCULAR:  Regular rate and rhythm without murmur.  EXTREMITIES: No edema.  NEUROLOGICAL:  Speech was clear.  She has a very subtle left facial  weakness.  Otherwise, motor was fine and reflexes were symmetric.   LABORATORY AND X-RAYS:  CT of the head and the cervical spine show no  acute disease.  Echocardiogram was essentially unremarkable.  Please see  report for details.  Brain MRI and MRA showed no acute infarct.  The MRA showed mild  intracranial atherosclerotic-type of changes.  Upon admission, CBC  showed white count of 6.5 with a hemoglobin of 10.9 and platelets of  242.  Back in August, her hemoglobin was 11.1.  Consequently the anemia  is stable.  Potassium 3.8, creatinine 0.8.  At the time of the  discharge, her potassium was 3.4.  LFTs were normal.  Cholesterol was  100 with an LDL of 43 and an HDL of 38.  Triglycerides 96, troponin was  negative one  time.  Homocystine level was 5.5.  Vitamin  B12 was normal.  Hemoglobin A1c was 5.2.   HOSPITAL COURSE:  The patient was admitted to the hospital to rule out  CVA.  Workup was essentially negative.  The MRI of the brain was  essentially nonacute.  She did have some changes consistent with  sinusitis.  We think that most of her symptoms were side effects from  Reglan.  She is today back to normal and not feeling dizzy.  I saw the  patient ambulating on her room without problems.  At this point, I think  that she got maximal hospital benefit and she will be discharged home  today with the following instructions.  1. Continue with her diabetic diet.  2. Increase activity slowly.  3. Continue with Lexapro 10 mg once daily.  4. Continue with Lorazepam as before.  5. Glipizide 5 mg b.i.d. as before.  6. Continue with Actos at previous doses.  7. Aggrenox twice a day.  8. Vicodin as needed for pain.  She got a prescription for 40 tablets.  9. Ceftin for 2 weeks.  10.She is advised to discontinue Reglan and hold Ultracet while she is      taking Vicodin.  11.She is advised to call her doctor if she gets dizzy again and also      if her sugars are low.  12.Colace as needed for constipation.  13.Nexium 40 mg daily.  14.To see Dr. Laury Axon, her primary care doctor in one week.   ADMITTING DIAGNOSES:  Rule out cerebrovascular accident.   DISCHARGE DIAGNOSES:  1. Reglan intolerance.  2. Dizziness and confusion most likely from Reglan.  3. Diabetes with an A1c of 5.5.  Her blood sugars in the hospital have      been around 100.  Medication may need to be adjusted as an      outpatient.  4. Sprain after a fall with a negative CT of the neck now on Vicodin.  5. Hypertension well controlled.  6. Dyslipidemia.  Fasting lipid profile while in the hospital show      good results.  7. Anemia, stable at this point.      Willow Ora, MD  Electronically Signed     JP/MEDQ  D:  11/15/2006  T:  11/16/2006  Job:  [id number]   cc:   Lelon Perla, DO

## 2010-06-15 NOTE — Assessment & Plan Note (Signed)
[facility]                         GASTROENTEROLOGY OFFICE NOTE   NARY, SNEED                  MRN:          [id number]  DATE:10/24/2006                            DOB:          1940/06/01    Mrs. Wawrzyniak returns today on referral from Dr.  Laury Axon for complaints of  post prandial substernal fullness, early satiety and reflux symptoms.  Please see my note from January 16, 2006. She was started on Nexium  early in 2008 and it seemed to help her symptoms for a while. Her  symptoms would occasionally be relieved with belching, but she has been  unable to obtain any relief with belching. She occasionally will have  relief with the use of Maalox and TUMS in addition to her daily Nexium.  She notes no dysphagia, odynophagia, nausea, vomiting or weight loss.  She has noted some mild constipation in the past month or two related to  the beginning of Actos. She has a normocytic anemia with a recent  hemoglobin of 11.1 and MCV of 92.2. The remainder of her CBC, basic  metabolic panel, hepatic function panel and TSH were all unremarkable  except for a minimally elevated glucose at 113.   CURRENT MEDICATIONS:  As listed on the chart, updated and reviewed.   MEDICATION ALLERGIES:  VYTORIN.   PHYSICAL EXAMINATION:  In no acute distress. Weight is 182 pounds. Blood  pressure 110/76, pulse 64 and regular.  CHEST: Clear to auscultation bilaterally.  CARDIAC: Regular rate and rhythm without murmurs.  ABDOMEN: Soft and nontender. Normoactive bowel sounds. No palpable  organomegaly, masses or hernias.   ASSESSMENT/PLAN:  Gastroesophageal reflux disease with early satiety,  rule out refractory reflux symptoms, esophagitis, ulcer disease,  gastroparesis and biliary symptoms. Begin all standard anti-reflux  measures. Increase Nexium to 40 mg p.o. b.i.d. taken 30-60 minutes  before breakfast and dinner. May use Maalox and TUMS on a p.r.n. basis.  She is  given all standard instructions for constipation including a high  fiber diet and increased fluid intake. Risks, benefits and alternatives  to upper endoscopy with possible biopsy discussed with the patient and  she consents to proceed. We will proceed with this procedure while on  Aggrenox. Consider a gastric emptying scan and abdominal ultrasound if  the endoscopy is unremarkable.     Venita Lick. Russella Dar, MD, [facility]  Electronically Signed    MTS/MedQ  DD: 10/24/2006  DT: 10/24/2006  Job #: [id number]   cc:   Lelon Perla, DO

## 2010-06-18 NOTE — Assessment & Plan Note (Signed)
[name]                               PULMONARY OFFICE NOTE   [name], [name]                  MRN:          [id number]  DATE:08/31/2005                            DOB:          November 04, 1940    SUBJECTIVE:  This is a followup visit for [name], who was seen  initially for sleep disturbance, which was deemed to be psychophysiologic  insomnia with some contributions of nocturia that was disrupting her sleep.  She had a sleep study that showed some obstructive apneas and hypopneas, but  her RDI did not reach criteria for a diagnosis of obstructive sleep apnea  and, therefore, she was not referred for C-PAP titration.  She does still  find herself having some episodes of needing to sigh or take a deep breath.  She does note that her sleep is better after she was given a prescription to  cut down on her nocturia.  She exerts herself without difficulty and is able  to walk a mile.  She is not having any limitation to her activity.   PHYSICAL EXAMINATION:  GENERAL:  This is a pleasant, elderly woman, who is  somewhat obese, who is in no distress.  VITAL SIGNS:  Her weight is 181 pounds, temperature 98.4, blood pressure  124/74, heart rate 69, spO2 98% on room air.  LUNGS:  Clear to auscultation bilaterally.  HEART:  Regular rate and rhythm without murmur.  ABDOMEN:  Obese, soft, nontender with positive bowel sounds.  EXTREMITIES:  No cyanosis, clubbing or edema.   MEDICATIONS:  1.  K-Dur 20 mEq daily.  2.  Norvasc 10 mg daily.  3.  Glucotrol 5 mg twice daily.  4.  Multivitamin one daily.  5.  Aggrenox one twice daily.  6.  Crestor 10 mg daily.  7.  Tramadol 50 mg q.6h. p.r.n.  8.  A new diabetes medication the name of which she cannot recall.   IMPRESSION:  1.  Psychophysiologic insomnia that has improved since she began the      medication for her nocturia.  She does not meet criteria for obstructive      sleep apnea.  She needs  to maintain good sleep hygiene and we discussed      the details of this.  2.  Occasional dyspnea and questionable restrictive disease.  She had a      sniff test under fluoroscopy that was negative for any diaphragmatic      dysfunction.  Suspect that this is due to weight-gain or deconditioning.      If she develops any further symptoms, cough, wheezing, limiting dyspnea,      then I have asked her to return to see me.  At that time, it may be      necessary for [place] to repeat her imaging or to reattempt pulmonary      function testing, which was difficult for her to perform.  Leslye Peer, MD   RSB/MedQ  DD:  08/31/2005  DT:  08/31/2005  Job #:  [id number]   cc:   Loreen Freud, MD

## 2010-06-18 NOTE — H&P (Signed)
[name], [name]           ACCOUNT NO.:  [id number]   MEDICAL RECORD NO.:  [id number]          PATIENT TYPE:  EMS   LOCATION:  MAJO                         FACILITY:  [facility]   PHYSICIAN:  Genene Churn. Love, M.D.    DATE OF BIRTH:  06/20/1940   DATE OF ADMISSION:  05/04/2005  DATE OF DISCHARGE:                                HISTORY & PHYSICAL   This is the second [facility] admission for this 70 year old, right  handed, black married female admitted from the emergency room for evaluation  of recurrent left leg weakness.   HISTORY OF PRESENT ILLNESS:  [name] has a 7 year history of diabetes  mellitus and a 20 year history of hypertension. In March of 2007, she has  noted recurrent onset of left leg weakness usually in a standing position  with loss of control of the leg and numbness in the left leg. This has  occurred on at least 4 or 5 occasions in the last 4-6 weeks. She was  evaluated with a CT scan of the brain without contrast April 13, 2005 which  was unremarkable but did have a positive MRI on April 22, 2005 with lesions  noted in the corpus callosum considered to be possible vascular disease  versus multiple sclerosis. She was admitted from March 27 through April 29, 2005 for left leg weakness. At that time, her MRI was positive for an ACA  stroke. She had negative Doppler study of the carotid arteries, negative  intracranial and extracranial MRA, high LDLs at 137 with HDL of 34, and a 2-  D echocardiogram showing left ventricular ejection fraction of 55 to 60%,  right ventricle mildly dilated, estimated peak pulmonary artery systolic  pressure mildly increased, moderate tricuspid valvular regurgitation and  right atrium was mildly dilated. She was discharged on aspirin and Vytorin.  She has been very active, hard to keep in a restful state. She has had  difficulty sleeping at night and was to be evaluated for the possibility of  obstructive sleep apnea  syndrome prior to the recent recurrent left leg  weakness. She had been paying bills over in [facility] and very  active yesterday, went to bed and this morning awoke generally weak with  left leg weakness, some mild headache, no chest pain, or palpitations and  was brought to the emergency room where a CT scan showed a new right ACA  stroke.   PAST MEDICAL HISTORY:  Significant for hypertension for 20 years, type 2  diabetes mellitus x7 years, right ACA stroke March 2007, ectopic pregnancy  surgery in the past, benign left breast tumor surgery, vocal cord polyp.   MEDICATIONS:  1.  Norvasc 10 mg daily.  2.  Glucotrol 5 mg b.i.d.  3.  Maxzide 75/50 daily.  4.  Vytorin 10/20 daily.  5.  Aspirin 1 daily.  6.  Vitamins with iron 1 daily.   ALCOHOL AND TOBACCO:  She does not smoke cigarettes or drink alcohol.   ALLERGIES:  She has no known drug allergies.   REVIEW OF SYSTEMS:  Significant for snoring.   SOCIAL  HISTORY:  Significant in that she is educated through the eleventh  grade, worked at [employer] until 1976, had 4 children, one son  died at age 67. She has 2 other sons and one daughter living and well.   FAMILY HISTORY:  Her family history reveals that her mother is 2, living  well, her father died at 95 from a stroke. She has one brother whose 44  living well, she has one brother who had a stroke at 35 and died. She has a  sister 60 with diabetes mellitus, a sister 66, a sister 76, has diabetes,  hypertension, and cancer.   PHYSICAL EXAMINATION:  GENERAL:  Revealed a well-developed, black female.  VITAL SIGNS:  Blood pressure lying in the right and in the left arm were  120/80 and 130/80. Lying blood pressure 140/80 and then went to the sitting  position with legs dangling of 140/80, heart rate 64, no bruits were heard.  NEUROLOGIC:  Mental status, she was alert and oriented x3, somewhat anxious.  She followed commands, knew the president. Cranial  nerves examination  revealed visual fields full, disks flat, spontaneous venous pulsations seen,  extraocular movements full. Corneals present. Facial sensation equal, no  facial motor asymmetry. Hearing intact. Air conduction greater than bone  conduction. Tongue midline, uvula midline, gag is present.  Sternocleidomastoid, trapezius testing normal. Motor examination, 5/5 upper  extremities. In the lower extremities, she had apractic left leg with poor  heel to shin. Sensory examination intact to pinprick, touch, joint position,  and vibrations, deep tendon reflexes 2+. Plantar responses down going. Her  general examination was unremarkable.  HEENT:  Tympanic membranes clear.  LUNGS:  Clear.  HEART:  Without murmur.  ABDOMEN:  Bowel sounds were normal.  BREASTS:  Normal.  EXTREMITIES:  There was no cyanosis, clubbing or edema in the lower  extremities.   LABORATORY DATA:  Revealed a CT scan with new right ACA stroke, white blood  cell count was 8500, hemoglobin 10.8, hematocrit 32.4, platelets 366K, pro  time 12.8. INR 0.9, PTT 29, sodium 141, potassium 3.9, chloride 106, CO2  content 27. BUN 20, creatinine 1.4, glucose 112. Liver function tests  normal. EKG with sinus bradycardia.   IMPRESSION:  1.  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  2.  Hypertension, code 796.2  3.  Diabetes mellitus, code 250.60.  4.  Lumbar spinal stenosis, code 724.02.  5.  Dyslipidemia, code 272.4.  6.  Restless sleep, rule out obstructive sleep apnea syndrome, code 780.57.   PLAN:  Admit the patient, obtain coagulopathy workup, observe for sleep  apnea, obtain MRI of the brain again for further evaluation and intracranial  MRA. A homocystine level and sed rate will be obtained. She will placed on  Aggrenox and Lovenox.           ______________________________  Genene Churn. Sandria Manly, M.D.    JML/MEDQ  D:  05/04/2005  T:  05/04/2005  Job:  [id number]   cc:    Loreen Freud, M.D.  [address]  [place] [id number]

## 2010-06-18 NOTE — Discharge Summary (Signed)
[name], [name]           ACCOUNT NO.:  [id number]   MEDICAL RECORD NO.:  [id number]          Mathews TYPE:  INP   LOCATION:  3013                         FACILITY:  [facility]   PHYSICIAN:  Pramod P. Pearlean Brownie, MD    DATE OF BIRTH:  1941/01/06   DATE OF ADMISSION:  05/04/2005  DATE OF DISCHARGE:  05/06/2005                                 DISCHARGE SUMMARY   DISCHARGE DIAGNOSES:  1.  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  2.  Likely orthostatic hypotension.  3.  Hypertension x20 years.  4.  Diabetes type 2 x7 years.  5.  Ectopic pregnancy.  6.  Benign left breast tumor.  7.  Vocal cord polyp.   DISCHARGE MEDICATIONS:  1.  Aggrenox 1 b.i.d.  2.  Norvasc 10 mg a day.  3.  Glucotrol 5 mg b.i.d.  4.  Vytorin 10/20 mg a day.  5.  Hold Maxzide.   STUDIES PERFORMED:  1.  CT of Olivia head done Olivia day prior to admission shows no acute      abnormality, stable white matter disease, and right ACA infarct.  2.  CT of Olivia head performed April 4th shows new focus of decreased      attentuation in Olivia periventricular deep white matter on Olivia right      frontal lobe worrisome for acute or subacute infarct.  3.  MRI of Olivia brain shows progressive infarct in Olivia right anterior      cerebral artery territory distribution involving Olivia medial frontal      cortex, Olivia corpus callosum, and singlet gyrus, all on Olivia right.  Early      blood-brain barrier breakdown related to initial infarct discovered on      April 22, 2005.  Mild atrophy and chronic microvascular ischemic change.      No signs of hemorrhage noted.  Olivia distal aspect of Olivia anterior      cerebral artery is not observed on MRA.  4.  Chest x-ray shows cardiomegaly with no interval change.  5.  EKG shows sinus bradycardia with nonspecific T-wave abnormalities.  6.  Two-D echocardiogram shows normal ejection fraction.   LABORATORY DATA:  Homocystine pending, cholesterol 152, triglycerides  136,  HDL 36, and LDL 89.  Urinalysis was negative.  Sed rate was 43, hemoglobin  A1c 6.5, RPR nonreactive.  CBC with hemoglobin 10.8, hematocrit 32.4, red  blood cells 3.6, otherwise normal.  Differential with 4 basophils, otherwise  normal.  Chemistry with glucose 112, creatinine 1.4, otherwise normal.  Liver function tests are normal.   HISTORY OF PRESENT ILLNESS:  [name] is a 70 year old, right-handed,  African-American female, who has a seven-year history of diabetes and three-  year history of hypertension.  In March in 2007, she noted recurrent onset  of left leg weakness in a standing position with loss of control of Olivia leg  and numbness in Olivia left leg.  It had occurred on four to five occasions in  Olivia last four to six weeks.  She was evaluated with a CT of  Olivia brain, April 13, 2005, which was unremarkable but did have a positive MRI on April 22, 2005, with lesions noted in Olivia corpus callosum considered to be possible  vascular disease versus multiple sclerosis.  She was admitted to Olivia  hospital from March 27th through March 30th for left leg weakness.  At that  time, her MRI was positive for an acute ACA stroke.  She had negative  Doppler studies of Olivia carotid arteries, negative intracranial and  extracranial MRA, high LDL at 137, HDL at 34, and two-D echocardiogram  showing EF of 55 to 60%.  She was discharged on aspirin and Vytorin.  She  has been very active and hard to keep in a restful state.  She had  difficulty sleeping Olivia night prior to admission and was to be evaluated for  a possible obstructive sleep apnea syndrome prior to recent recurrent left  leg weakness.  She had been busy Olivia morning of admission and had gone home  and taken a nap.  She awoke with weakness in her left leg and mild headache.  No chest pain or palpitations and was brought to Olivia emergency room where a  CT scan of Olivia head showed what was felt to be a new right ACA stroke.  She  was  admitted to Olivia hospital for further stroke evaluation.   HOSPITAL COURSE:  MRI was repeated and again showed a right MCA infarct.  Dr. Pearlean Brownie did not feel this infarct was new or evolving, but a difference  occurred in Olivia MRI secondary to age of stroke.  He felt Olivia symptoms were  identical probably most notably accompanied by an orthostatic hypotension as  [name] would describe dizziness upon standing and trying to walk.  Her  orthostatics were checked in Olivia hospital and they were within normal  limits; however, [name] was taught orthostatic tolerance exercises to  use at home.  Of note, she had a sed rate of 43, but otherwise workup was  unremarkable for any new risk factors or problems.  She was seen by Speech  Therapy and felt not to have any difficulty.  She was seen by Rehab doctors  and felt to be safe for discharge home with Home Health versus outpatient  therapies.  [name] also needs to continue with her sleep study followup  as an outpatient, and she was changed to Aggrenox for secondary stroke  prevention.   CONDITION ON DISCHARGE:  [name] complained of orthostasis this a.m. and  felt woozy and could not walk.  This has now resolved.  Currently, she has  good left lower extremity strength and no other physical deficits.   DISCHARGE PLAN:  1.  Discharge home with family.  2.  Aggrenox for secondary stroke prevention.  3.  Outpatient sleep study at [facility], Dr. Vickey Huger to read.  4.  Orthostatic tolerance exercises at home before getting out of bed.  5.  Follow up with Dr. Laury Axon within one month after discharge.  6.  Follow up with Dr. Pearlean Brownie in two to three months.      Annie Main, N.P.    ______________________________  Sunny Schlein. Pearlean Brownie, MD    SB/MEDQ  D:  05/06/2005  T:  05/07/2005  Job:  [id number]   cc:   Pramod P. Pearlean Brownie, MD  Fax: [phone]   Loreen Freud, M.D.  [address] [place]  [place] [address]

## 2010-06-18 NOTE — Consult Note (Signed)
[name], [name]           ACCOUNT NO.:  [id number]   MEDICAL RECORD NO.:  [id number]          PATIENT TYPE:  EMS   LOCATION:  MAJO                         FACILITY:  [facility]   PHYSICIAN:  Bevelyn Buckles. Champey, M.D.DATE OF BIRTH:  25-Nov-1940   DATE OF CONSULTATION:  DATE OF DISCHARGE:                                   CONSULTATION   REQUESTING PHYSICIAN:  Dr. Ignacia Palma.   REASON FOR CONSULTATION:  Left lower extremity weakness.   HISTORY OF PRESENT ILLNESS:  [name] is a 70 year old African-American  female with multiple medical problems including a recent discharge from the  hospital with an acute right frontal/ACA infarct and also found on  hospitalization to have spinal stenosis. She represents today after feeling  like her left lower extremity is weak and being slightly unsteady on her  feet. Since her discharge, the patient was set up with home PT and told to  walk with a walker. Physical therapy was suppose to start later today.  Yesterday at lunch, the patient felt that her left leg primarily gave out  and she was fairly unsteady on her feet. She has noticed that since her  discharge feeling like her left leg is weak. She also complained of some  slight back discomfort. Her MRI of the lumbar spine on admission showed some  spinal stenosis. At times, she does feel unsteady on her feet, however,  denies any falls. She does state she is extremely anxious and cautious when  she walks. She denies any vision changes, swallowing problems, chewing  problems, vertigo, dizziness, falls or loss of consciousness.   PAST MEDICAL HISTORY:  Positive for CVA, diabetes, hypertension, glaucoma,  spinal stenosis.   CURRENT MEDICATIONS:  Norvasc, Glucophage, Maxzide, multivitamin, Vytoran,  aspirin, Kay Ciel.   ALLERGIES:  The patient has no known drug allergies.   FAMILY HISTORY:  Positive for heart disease and diabetes.   SOCIAL HISTORY:  The patient is retired and denies any  alcohol or tobacco  use.   REVIEW OF SYSTEMS:  Positive as per HPI and also positive for occasional  shortness of breath. Review of systems is negative as per HPI in greater  than 7 other organ systems.   PHYSICAL EXAMINATION:  VITAL SIGNS:  Temperature is 99.1, blood pressure is  121/78, pulse is 68, respirations 16, O2 sat is 98%.  HEENT:  Normocephalic, atraumatic. Extraocular muscles are intact. Pupils  equal round and reactive to light.  NECK:  Supple, no carotid bruits.  HEART:  Regular.  LUNGS:  Clear.  ABDOMEN:  Soft, nontender,  EXTREMITIES:  Show no edema with good pulses.  NEUROLOGIC:  The patient is awake, alert and oriented x3. Language is good.  Memory and knowledge are within normal limits. Cranial nerves II-XII are  grossly intact. Motor examination shows good strength in all four  extremities. The patient might have possibly slight subtle weakness at 5-/5  in the left lower extremity; however, seems to give way on testing proximal  left lower extremity strength. The patient has normal tone in all four  extremities. Sensory examination is within normal limits to light touch  and  pin prick throughout. Reflexes are 1 and 2+ throughout and symmetric. Toes  are neutral bilaterally. Cerebellar function is within normal limits, finger  to nose and rapid alternating movements and heel to shin and gait. The  patient has a slightly wide based gait that is steady without a walker. The  patient feels like her left leg might give out while walking. MRI of the  brain done last week showed an acute right posterior frontal infarct  consistent with ACA territory infarct. MRI of the lumbar spine showed  central canal stenosis at L4-5 with bilateral L4 nerve root encroachment and  mild L4-5 stenosis. CT of the head done today showed no acute findings or  changes since prior scans.   LABORATORY DATA:  Hemoglobin was 10.3, hematocrit was 30.3.   IMPRESSION:  A 70 year old  African-American female with recent right ACA  infarct and spinal stenosis who presents with left lower extremity weakness  and feeling gives way. The symptoms seem to be very similar and probably  related to her CVA from last week. The patient's spinal stenosis could also  be contributing to her back discomfort. The patient does have home physical  therapy set up as upon discharge a few days ago and the patient should walk  with a walker for stability as her symptoms from her stroke should improve  over time. Her spinal stenosis also needs to be evaluated and followed as an  outpatient. She overall seems to have good strength on examination, no  sensory deficits on examination. We will arrange her followup as an  outpatient and possibly EMG nerve conduction studies as an outpatient. The  patient's primary care physician supposedly is setting up patient with  neurosurgery as well as for her spinal stenosis. I have discussed with the  patient to walk with a walker and followup with physical therapy as this  will improve her symptoms as well. We will call the patient for a followup  appointment and possibly EMG nerve conduction studies as well. The patient  understands and agrees with the plan.      Bevelyn Buckles. Nash Shearer, M.D.  Electronically Signed     DRC/MEDQ  D:  05/03/2005  T:  05/04/2005  Job:  [id number]

## 2010-06-18 NOTE — Assessment & Plan Note (Signed)
[facility]                                 ON-CALL NOTE   [name], [name]                    MRN:          [id number]  DATE:01/26/2006                            DOB:          12-14-1940    DATE AND TIME RECEIVED:  January 25, 2006, at 7:39 p.m.   CALLER:  Shaddai Shapley   PATIENT PHYSICIAN:  Dr. Fara Boros NUMBER:  [phone]   The patient is scheduled for a colonoscopy tomorrow.  She has been  drinking on clear liquids all day.  She tends to have heartburn and  wants to know if she may take a Nexium this evening without interfering  with her prep.  My response is yes, it would be fine to take a Nexium  right now.     Tera Mater. Clent Ridges, MD  Electronically Signed    SAF/MedQ  DD: 01/26/2006  DT: 01/26/2006  Job #: [id number]

## 2010-06-18 NOTE — Discharge Summary (Signed)
[name]           ACCOUNT NO.:  [id number]   MEDICAL RECORD NO.:  [id number]          PATIENT TYPE:  INP   LOCATION:  3039                         FACILITY:  [facility]   PHYSICIAN:  Melissa S. Peggyann Juba, NPDATE OF BIRTH:  1941-01-11   DATE OF ADMISSION:  04/26/2005  DATE OF DISCHARGE:  04/29/2005                                 DISCHARGE SUMMARY   DISCHARGE DIAGNOSES:  1.  Left lower extremity weakness/numbness, questionably secondary to spinal      stenosis noted on lumbar MRI.  2.  Acute right posterior frontal and corpus callosal infarcts.   HISTORY OF PRESENT ILLNESS:  The patient is a 70 year old African-American  female who presented on April 26, 2005, with complaints of numbness and left-  sided weakness, noting that her leg gave out on two occasions.  The patient  had several falls prior to admission secondary to her leg giving out on her.  She had an MRI of the brain which was done on March 23, which showed right  brain scattered foci.  The patient was admitted for further evaluation and  treatment.   PAST MEDICAL HISTORY:  1.  Hypertension.  2.  Diabetes type 2.  3.  Glaucoma, right eye.   COURSE OF HOSPITALIZATION:  ACUTE ON CHRONIC LEFT LOWER EXTREMITY WEAKNESS WITH PARESTHESIA:  The  patient was admitted for a stroke workup.  She was evaluated by neurology  and an MRI/MRA was performed during this admission, which showed an acute  right posterior frontal corpus callosal infarct which was stable and  unchanged as compared to April 22, 2005.  MR angiography was unremarkable.   She was noted to be dyslipidemic and was started on a statin; however, the  patient states that she recently received a prescription for Vytorin from  Dr. Laury Axon and she will be discharged home on Vytorin.  It was recommended by  the stroke team that the patient be continued on aspirin.  An MRI of the  lumbar spine was performed, which showed some spinal stenosis:  Moderate  advanced central canal stenosis at L3-4 and mild stenosis in L4-5.  It was  recommended by neurology that she undergo PT, exercise and symptomatic  control for her spinal stenosis and a neurosurgical evaluation if it  progresses.   The patient was evaluated by physical therapy and occupational therapy.  She  was found to have no acute OT needs.  It was recommended that she be  discharged to home with home health PT.  She was also evaluated by the rehab  team, and they also recommended home health PT.   MEDICATIONS AT DISCHARGE:  1.  Norvasc 10 mg p.o. daily.  2.  Glucophage.  The patient was noted to have mild renal insufficiency      during this admission with a creatinine of 1.5.  As a result, Glucophage      was then placed on hold.  3.  Maxzide 75/50 mg one tablet p.o. daily.  4.  Multivitamin one tablet p.o. daily.  5.  Vytorin 10/20 mg one tablet  p.o. daily.  6.  Aspirin 325 mg  p.o. daily.  7.  Potassium 20 mEq p.o. daily.   PERTINENT LABORATORIES AT DISCHARGE:  BUN 19, creatinine 1.5.  Cholesterol  213, triglycerides 212, LDL 137.  Hemoglobin 10.4, hematocrit 30.9.   FOLLOW-UP:  Patient instructed to follow up with [name], M.D., next  week and call for appointment.      Melissa S. Peggyann Juba, NP     MSO/MEDQ  D:  04/29/2005  T:  05/02/2005  Job:  [id number]   cc:   [name], M.D.  [address] [place]  [place] [address]

## 2010-06-18 NOTE — Consult Note (Signed)
NAMEPATRIC, VANPELT NO.:  [id number]   MEDICAL RECORD NO.:  [id number]          PATIENT TYPE:  EMS   LOCATION:  URG                          FACILITY:  [facility]   PHYSICIAN:  Marlan Palau, M.D.  DATE OF BIRTH:  Jun 26, 1940   DATE OF CONSULTATION:  04/26/2005  DATE OF DISCHARGE:                                   CONSULTATION   NEUROLOGY CONSULTATION NOTE   HISTORY OF PRESENT ILLNESS:  [name] is a 70 year old right-hand  dominant black female born 02/04/1940, who has a history of diabetes  and hypertension.  Over the last month, the patient noted onset of some left  leg weakness that seems to undulate at times, worse some days than others.  Recently, the patient has had significant worsening of her deficit and feels  as if the leg will collapse.  She has had a couple of episodes today.  She  comes into the hospital for further evaluation.   MRI scan done as an outpatient recently on April 22, 2005, shows evidence of  chronic small vessel disease, but also some acute stroke is noted in the  anterior cerebral artery distribution on the right.  The patient is being  brought in for further evaluation at this point.   PAST MEDICAL HISTORY:  1.  Left leg weakness with right anterior cerebral artery distribution      stroke.  2.  Diabetes.  3.  Hypertension.  4.  Hysterectomy.  5.  Glaucoma in the right eye.  6.  Ectopic pregnancy.  7.  Appendectomy in the past.   MEDICATIONS:  1.  Glucotrol 5 mg b.i.d.  2.  Norvasc 10 mg daily.  3.  Glucophage 500 mg once daily.  4.  Maxzide daily.  5.  Aspirin daily.   ALLERGIES:  The patient has no known allergies.   SOCIAL HISTORY:  Does not smoke or drink.  The patient lives in the  [place], area.  She is married and is retired.  She has 4  children; three have diabetes and hypertension.   FAMILY MEDICAL HISTORY:  Mother still alive at age 70 with glaucoma.  Father  died at age  3 with an MI.  The patient has 2 sisters and 1 brother.  The  brother has diabetes and cancer.  Two sisters have diabetes.   REVIEW OF SYSTEMS:  Notable for some occasional chills.  No fevers.  The  patient denies headache, loss of vision, double vision, trouble swallowing,  slurred speech, problems with speech.  Denies any chest pain.  Does have  some slight shortness of breath.  Denies abdominal pain or troubles  controlling the bowels or bladder.  Denies any falls.  The patient denies  any syncopal or blackout episodes.   PHYSICAL EXAMINATION:  VITAL SIGNS:  Blood pressure 118/76, heart rate 66,  respiratory rate 18, temperature afebrile.  GENERAL:  This patient is a fairly well-developed black female who is alert  and cooperative at the time of examination.  HEENT:  Head is atraumatic.  Eyes with pupils equal, round, and reactive to  light.  Disks are flat bilaterally.  NECK:  Supple.  No carotid bruits noted.  RESPIRATORY:  Examination is clear.  CARDIOVASCULAR:  A regular rate and rhythm.  No obvious murmurs or rubs  noted.  EXTREMITIES:  Without significant edema.  NEUROLOGIC:  Cranial nerves as above.  Facial symmetry is present.  The  patient has good sensation to pin prick and soft touch bilaterally.  Has  good strength in facial muscles, muscles of the head, and shoulder shrug  bilaterally.  Speech is well-enunciated and not aphasic.  Motor test reveals  5/5 strength on both arms.  The patient seems to have some problems with  slight weakness with the iliopsoas muscle on the left leg but mainly has  apraxia with use of the left and cannot figure out what to do with it.  Mild  apraxia noted in the left arm as well.  The patient has fair finger-nose-  finger bilaterally.  Is not able to perform heel-to-shin as well on the left  as she is on the right.  Good strength of the right lower extremity noted.  Pin prick, soft touch, vibratory sensation of the legs symmetric and  normal.  No drift is seen.  Gait is slightly wide-based.  The patient has a problem  with Romberg and tends to fall backwards.  No drift is seen.  Deep tendon  reflexes depressed but symmetric.  Toes neutral bilaterally.   LABORATORY DATA:  Laboratory values are pending at this time.  MRI scan of  the brain done previously is as above.   IMPRESSION:  1.  Right anterior cerebral artery distribution stroke with left leg      weakness.  2.  Hypertension.  3.  Diabetes.   This patient has risk factors for stroke and has been neurologically  unstable for the last several days.  The patient was brought in for further  evaluation.   PLAN:  1.  Diffusion weighted image of the brain.  2.  MRI angiogram of intracranial and extracranial vessels.  3.  A 2D echocardiogram.  4.  Aspirin and Lovenox therapy.  5.  Physical and occupational therapy.  6.  Will follow the patient's clinical course while in house.      Marlan Palau, M.D.  Electronically Signed    CKW/MEDQ  D:  04/26/2005  T:  04/28/2005  Job:  [id number]   cc:   [name], M.D.  [address] [place], [place] [address]

## 2010-06-18 NOTE — Assessment & Plan Note (Signed)
[facility]                         GASTROENTEROLOGY OFFICE NOTE   KLEA, NALL                  MRN:          [id number]  DATE:01/16/2006                            DOB:          1941-01-21    REASON FOR REFERRAL:  Colorectal cancer screening on anticoagulation.   HISTORY OF PRESENT ILLNESS:  [name] is a 70 year old African-  American female referred through the courtesy of Dr. Loreen Freud.  She  has a history of cerebrovascular accident in March of this year with  residual left-sided weakness and some ongoing dizziness.  She is  maintained on Aggrenox.  I have a note from Dr. Laury Axon that indicates  that Dr. Pearlean Brownie and Dr. Laury Axon have cleared her to come off Aggrenox to  allow for colonoscopy with polypectomy.  Her mother had colon cancer at  age 68.  She relates having a sigmoidoscopy performed by Dr. Shaune Pollack  approximately 10 years ago that was unremarkable.  She has intermittent  problems with reflux and takes Nexium on a p.r.n. basis, which controls  her symptoms quite well.  She relates no change in bowel habits, change  in stool caliber, melena, hematochezia, abdominal pain, or weight loss.   PAST MEDICAL HISTORY:  Hypertension, hyperlipidemia, diabetes mellitus,  cerebrovascular accident in March of 2007, depression, anxiety, seasonal  allergies, history of an ectopic pregnancy, status post tonsillectomy as  a child, status post left breast biopsy for a benign lesion in 1975,  status post bilateral tubal ligation 1975, status post hysterectomy in  1982, status post appendectomy 1982.   CURRENT MEDICATIONS:  Listed on the chart, updated, and reviewed.   MEDICATION ALLERGIES:  ? VYTORIN.   SOCIAL HISTORY AND REVIEW OF SYSTEMS:  Per the hand-written form.   PHYSICAL EXAM:  Well-developed, well-nourished African-American female  in no acute distress.  Height 5 feet 5-1/2 inches.  Weight 176 pounds, blood pressure  100/60,  pulse 62 and regular.  HEENT:  Anicteric sclerae.  Oropharynx clear.  CHEST:  Clear to auscultation bilaterally.  CARDIAC:  Regular rate and rhythm without murmurs appreciated.  ABDOMEN:  Soft and nontender.  Nondistended.  Normoactive bowel sounds.  No palpable organomegaly, masses, or hernias.  RECTAL:  Deferred to time of colonoscopy.  EXTREMITIES:  Without cyanosis, clubbing, or edema.  NEUROLOGIC:  Alert and oriented x3.  Grossly nonfocal.   ASSESSMENT AND PLAN:  Average risk for colorectal cancer.  She has been  cleared by Drs. Pearlean Brownie and [place] to hold Aggrenox prior to the  colonoscopy.  Aggrenox will be held for 5 days prior to the procedure.  Risks, benefits, and alternatives to colonoscopy with possible biopsy  and possible polypectomy discussed with the patient, and she consents to  proceed.  This will be scheduled electively.     Venita Lick. Russella Dar, MD, [facility]  Electronically Signed    MTS/MedQ  DD: 01/16/2006  DT: 01/16/2006  Job #: [id number]   cc:   Loreen Freud, M.D.

## 2010-06-18 NOTE — Procedures (Signed)
[name], HEADINGS           ACCOUNT NO.:  [id number]   MEDICAL RECORD NO.:  [id number]          PATIENT TYPE:  OUT   LOCATION:  SLEEP CENTER                 FACILITY:  [facility]   PHYSICIAN:  Marcelyn Bruins, M.D. [facility] DATE OF BIRTH:  April 06, 1940   DATE OF STUDY:  05/29/2005                              NOCTURNAL POLYSOMNOGRAM   REFERRING PHYSICIAN:  Dr. Loreen Freud    INDICATION FOR THE STUDY:  Persistent disorder of initiating and  maintaining sleep.   EPWORTH SLEEPINESS SCORE:  3.   SLEEP ARCHITECTURE:  The patient had a total sleep time of 352 minutes with  very little REM and never achieved slow wave sleep.  Sleep onset latency was  normal and REM onset was prolonged at 175 minutes.  Sleep efficiency was  decreased at 85%.   RESPIRATORY DATA:  The patient was found to have 17 hypopneas and 15 apneas  for a respiratory disturbance index of six events per hour.  Events were a  little more common in the supine position and there was mild to moderate  snoring noted throughout.  Patient did not meet split night criteria  secondary to the small numbers of events.   OXYGEN DATA:  There was O2 desaturation as low as 90% with the patient's  obstructive events.   CARDIAC DATA:  No clinically significant cardiac arrhythmias.   MOVEMENT-PARASOMNIA:  The patient was found to have 20 leg jerks with very  little sleep disruption.   IMPRESSIONS-RECOMMENDATIONS:  Very mild obstructive sleep apnea/hypopnea  syndrome with a respiratory disturbance index of six events per hour and O2  desaturation as low as 90%.  Patient did not meet split night criteria  secondary to the small numbers of events.  It is unclear from this study  whether the patient's symptomatology can be explained by the above findings.  Clinical correlation is suggested.   TREATMENT:  If treatment is considered for her sleep apnea weight loss if  applicable, upper airway surgery, oral appliance, and CPAP can all be  considered.           ______________________________  Marcelyn Bruins, M.D. [facility]  Diplomate, American Board of Sleep  Medicine    KC/MEDQ  D:  06/07/2005 47:82:95  T:  06/08/2005 14:58:04  Job:  [id number]

## 2010-07-06 ENCOUNTER — Ambulatory Visit: Payer: PRIVATE HEALTH INSURANCE | Admitting: Gastroenterology

## 2010-10-12 ENCOUNTER — Other Ambulatory Visit: Payer: Self-pay | Admitting: Family Medicine

## 2010-10-12 DIAGNOSIS — Z8673 Personal history of transient ischemic attack (TIA), and cerebral infarction without residual deficits: Secondary | ICD-10-CM

## 2010-10-21 ENCOUNTER — Ambulatory Visit
Admission: RE | Admit: 2010-10-21 | Discharge: 2010-10-21 | Disposition: A | Discharge status: Home / Self Care | Payer: No Typology Code available for payment source | Source: Ambulatory Visit | Attending: Family Medicine | Admitting: Family Medicine

## 2010-10-21 DIAGNOSIS — Z8673 Personal history of transient ischemic attack (TIA), and cerebral infarction without residual deficits: Secondary | ICD-10-CM

## 2010-10-21 MED ORDER — IOHEXOL 300 MG/ML  SOLN: 75.0000 mL | Freq: Once | INTRAMUSCULAR | Status: AC | PRN

## 2010-10-22 ENCOUNTER — Other Ambulatory Visit: Payer: Self-pay | Admitting: Family Medicine

## 2010-10-22 DIAGNOSIS — G939 Disorder of brain, unspecified: Secondary | ICD-10-CM

## 2010-10-25 ENCOUNTER — Ambulatory Visit
Admission: RE | Admit: 2010-10-25 | Discharge: 2010-10-25 | Disposition: A | Discharge status: Home / Self Care | Payer: No Typology Code available for payment source | Source: Ambulatory Visit | Attending: Family Medicine | Admitting: Family Medicine

## 2010-10-25 DIAGNOSIS — G939 Disorder of brain, unspecified: Secondary | ICD-10-CM

## 2010-10-25 MED ORDER — GADOBENATE DIMEGLUMINE 529 MG/ML IV SOLN
14.0000 mL | Freq: Once | INTRAVENOUS | Status: AC | PRN
  Administered 2010-10-25: 14 mL via INTRAVENOUS

## 2010-10-26 ENCOUNTER — Other Ambulatory Visit: Payer: No Typology Code available for payment source

## 2010-10-29 LAB — COMPREHENSIVE METABOLIC PANEL
ALT: 28
AST: 23
Albumin: 4
Alkaline Phosphatase: 64
BUN: 14
CO2: 27
Calcium: 9
Chloride: 104
Creatinine, Ser: 0.86
GFR calc Af Amer: >60
GFR calc non Af Amer: >60
Glucose, Bld: 127 — ABNORMAL HIGH
Potassium: 3.6
Sodium: 139
Total Bilirubin: 0.6
Total Protein: 7

## 2010-10-29 LAB — CBC
HCT: 35.2 — ABNORMAL LOW
Hemoglobin: 11.3 — ABNORMAL LOW
MCHC: 32.2
MCV: 94.7
Platelets: 226
RBC: 3.71 — ABNORMAL LOW
RDW: 14.1
WBC: 8.8

## 2010-10-29 LAB — APTT: aPTT: 26

## 2010-10-29 LAB — PROTIME-INR
INR: 0.9
Prothrombin Time: 12.3

## 2010-10-31 ENCOUNTER — Ambulatory Visit (INDEPENDENT_AMBULATORY_CARE_PROVIDER_SITE_OTHER): Payer: No Typology Code available for payment source

## 2010-10-31 ENCOUNTER — Inpatient Hospital Stay (INDEPENDENT_AMBULATORY_CARE_PROVIDER_SITE_OTHER)
Admission: RE | Admit: 2010-10-31 | Discharge: 2010-10-31 | Disposition: A | Discharge status: Home / Self Care | Payer: No Typology Code available for payment source | Source: Ambulatory Visit | Attending: Emergency Medicine | Admitting: Emergency Medicine

## 2010-10-31 DIAGNOSIS — S5000XA Contusion of unspecified elbow, initial encounter: Secondary | ICD-10-CM

## 2010-10-31 DIAGNOSIS — T07XXXA Unspecified multiple injuries, initial encounter: Secondary | ICD-10-CM

## 2010-10-31 DIAGNOSIS — S0990XA Unspecified injury of head, initial encounter: Secondary | ICD-10-CM

## 2010-10-31 DIAGNOSIS — S8010XA Contusion of unspecified lower leg, initial encounter: Secondary | ICD-10-CM

## 2010-11-10 LAB — BASIC METABOLIC PANEL
BUN: 14
GFR calc non Af Amer: 52 — ABNORMAL LOW
Potassium: 3.4 — ABNORMAL LOW
Sodium: 142

## 2010-11-11 LAB — TROPONIN I: Troponin I: 0.02

## 2010-11-11 LAB — COMPREHENSIVE METABOLIC PANEL
ALT: 18
AST: 19
Albumin: 3.6
Alkaline Phosphatase: 61
BUN: 10
Chloride: 106
GFR calc Af Amer: >60
Potassium: 3.8
Sodium: 139
Total Bilirubin: 1.1

## 2010-11-11 LAB — LIPID PANEL
Total CHOL/HDL Ratio: 2.6
Triglycerides: 96
VLDL: 19

## 2010-11-11 LAB — URINALYSIS, ROUTINE W REFLEX MICROSCOPIC
Bilirubin Urine: NEGATIVE
Ketones, ur: NEGATIVE
Nitrite: NEGATIVE
Urobilinogen, UA: 1

## 2010-11-11 LAB — DIFFERENTIAL
Basophils Absolute: 0
Basophils Relative: 1
Eosinophils Absolute: 0.1
Eosinophils Relative: 1
Monocytes Absolute: 0.5

## 2010-11-11 LAB — PROTIME-INR
INR: 1
Prothrombin Time: 13.3

## 2010-11-11 LAB — CBC
HCT: 33 — ABNORMAL LOW
Platelets: 242
WBC: 6.5

## 2010-11-11 LAB — HEMOGLOBIN A1C: Mean Plasma Glucose: 108

## 2010-11-11 LAB — VITAMIN B12: Vitamin B-12: 715 (ref 211–911)

## 2011-01-14 ENCOUNTER — Other Ambulatory Visit: Payer: Self-pay | Admitting: Family Medicine

## 2011-01-14 DIAGNOSIS — Z1231 Encounter for screening mammogram for malignant neoplasm of breast: Secondary | ICD-10-CM

## 2011-01-20 ENCOUNTER — Ambulatory Visit
Admission: RE | Admit: 2011-01-20 | Discharge: 2011-01-20 | Disposition: A | Discharge status: Home / Self Care | Payer: No Typology Code available for payment source | Source: Ambulatory Visit | Attending: Family Medicine | Admitting: Family Medicine

## 2011-01-20 ENCOUNTER — Other Ambulatory Visit: Payer: Self-pay | Admitting: Family Medicine

## 2011-01-20 DIAGNOSIS — W19XXXA Unspecified fall, initial encounter: Secondary | ICD-10-CM

## 2011-02-08 ENCOUNTER — Ambulatory Visit
Admission: RE | Admit: 2011-02-08 | Discharge: 2011-02-08 | Disposition: A | Discharge status: Home / Self Care | Payer: No Typology Code available for payment source | Source: Ambulatory Visit | Attending: Family Medicine | Admitting: Family Medicine

## 2011-02-08 DIAGNOSIS — Z1231 Encounter for screening mammogram for malignant neoplasm of breast: Secondary | ICD-10-CM

## 2011-02-13 ENCOUNTER — Emergency Department (HOSPITAL_COMMUNITY)

## 2011-02-13 ENCOUNTER — Emergency Department (HOSPITAL_COMMUNITY)
Admission: EM | Admit: 2011-02-13 | Discharge: 2011-02-13 | Disposition: A | Discharge status: Home / Self Care | Attending: Emergency Medicine | Admitting: Emergency Medicine

## 2011-02-13 ENCOUNTER — Encounter (HOSPITAL_COMMUNITY): Payer: Self-pay | Admitting: Neurology

## 2011-02-13 DIAGNOSIS — R4701 Aphasia: Secondary | ICD-10-CM | POA: Insufficient documentation

## 2011-02-13 DIAGNOSIS — R29898 Other symptoms and signs involving the musculoskeletal system: Secondary | ICD-10-CM | POA: Insufficient documentation

## 2011-02-13 DIAGNOSIS — E785 Hyperlipidemia, unspecified: Secondary | ICD-10-CM | POA: Insufficient documentation

## 2011-02-13 DIAGNOSIS — F341 Dysthymic disorder: Secondary | ICD-10-CM | POA: Insufficient documentation

## 2011-02-13 DIAGNOSIS — E119 Type 2 diabetes mellitus without complications: Secondary | ICD-10-CM | POA: Insufficient documentation

## 2011-02-13 DIAGNOSIS — I10 Essential (primary) hypertension: Secondary | ICD-10-CM | POA: Insufficient documentation

## 2011-02-13 DIAGNOSIS — M549 Dorsalgia, unspecified: Secondary | ICD-10-CM | POA: Insufficient documentation

## 2011-02-13 DIAGNOSIS — Z79899 Other long term (current) drug therapy: Secondary | ICD-10-CM | POA: Insufficient documentation

## 2011-02-13 DIAGNOSIS — Z7982 Long term (current) use of aspirin: Secondary | ICD-10-CM | POA: Insufficient documentation

## 2011-02-13 DIAGNOSIS — R262 Difficulty in walking, not elsewhere classified: Secondary | ICD-10-CM | POA: Insufficient documentation

## 2011-02-13 DIAGNOSIS — K219 Gastro-esophageal reflux disease without esophagitis: Secondary | ICD-10-CM | POA: Insufficient documentation

## 2011-02-13 DIAGNOSIS — Z8673 Personal history of transient ischemic attack (TIA), and cerebral infarction without residual deficits: Secondary | ICD-10-CM | POA: Insufficient documentation

## 2011-02-13 DIAGNOSIS — F985 Adult onset fluency disorder: Secondary | ICD-10-CM | POA: Insufficient documentation

## 2011-02-13 DIAGNOSIS — G8929 Other chronic pain: Secondary | ICD-10-CM | POA: Insufficient documentation

## 2011-02-13 DIAGNOSIS — R42 Dizziness and giddiness: Secondary | ICD-10-CM | POA: Insufficient documentation

## 2011-02-13 DIAGNOSIS — R197 Diarrhea, unspecified: Secondary | ICD-10-CM | POA: Insufficient documentation

## 2011-02-13 DIAGNOSIS — R1110 Vomiting, unspecified: Secondary | ICD-10-CM | POA: Insufficient documentation

## 2011-02-13 LAB — COMPREHENSIVE METABOLIC PANEL
ALT: 20 U/L (ref 0–35)
AST: 20 U/L (ref 0–37)
Alkaline Phosphatase: 55 U/L (ref 39–117)
CO2: 26 mEq/L (ref 19–32)
GFR calc Af Amer: 60 mL/min — ABNORMAL LOW (ref >90)
GFR calc non Af Amer: 52 mL/min — ABNORMAL LOW (ref >90)
Glucose, Bld: 188 mg/dL — ABNORMAL HIGH (ref 70–99)
Potassium: 4.4 mEq/L (ref 3.5–5.1)
Sodium: 139 mEq/L (ref 135–145)

## 2011-02-13 LAB — URINALYSIS, ROUTINE W REFLEX MICROSCOPIC
Nitrite: NEGATIVE
Specific Gravity, Urine: 1.022 (ref 1.005–1.030)
pH: 6.5 (ref 5.0–8.0)

## 2011-02-13 LAB — URINE CULTURE: Culture  Setup Time: 201301140157

## 2011-02-13 LAB — DIFFERENTIAL
Basophils Absolute: 0 10*3/uL (ref 0.0–0.1)
Lymphocytes Relative: 22 % (ref 12–46)
Monocytes Absolute: 0.5 10*3/uL (ref 0.1–1.0)
Neutro Abs: 7.5 10*3/uL (ref 1.7–7.7)
Neutrophils Relative %: 73 % (ref 43–77)

## 2011-02-13 LAB — CBC
HCT: 34.4 % — ABNORMAL LOW (ref 36.0–46.0)
Platelets: 250 10*3/uL (ref 150–400)
RDW: 12.9 % (ref 11.5–15.5)
WBC: 10.3 10*3/uL (ref 4.0–10.5)

## 2011-02-13 LAB — APTT: aPTT: 25 seconds (ref 24–37)

## 2011-02-13 MED ORDER — MECLIZINE HCL 50 MG PO TABS: 50.0000 mg | ORAL_TABLET | Freq: Three times a day (TID) | ORAL | Status: AC | PRN

## 2011-02-13 MED ORDER — ONDANSETRON HCL 4 MG/2ML IJ SOLN
INTRAMUSCULAR | Status: AC
  Filled 2011-02-13: qty 2

## 2011-02-13 NOTE — ED Notes (Signed)
Pt has hx of previous CVA, left sided weakness evident, unsure whether left sided deficts are new or from previous CVA. Pt is stuttering, some speech is slurred. Left side is weak. PT alert and oriented. Vitals stable

## 2011-02-13 NOTE — ED Notes (Signed)
EDP at bedside to evaluate patient. 

## 2011-02-13 NOTE — ED Provider Notes (Signed)
History     CSN: [id number]  Arrival date & time 02/13/11  1540   First MD Initiated Contact with Patient 02/13/11 1546      Chief Complaint  Patient presents with  . Gait Problem    (Consider location/radiation/quality/duration/timing/severity/associated sxs/prior treatment) HPI History provided by pt who is a poor historian.  C/o approx 1 week of stammering speech as well as difficulty w/ ambulation.  Describes as wobbling of bilateral LE and lack of balance.  She had dizziness yesterday and this morning it felt like she couldn't maker her left foot move.  Denies vision changes, dysphagia, paresthesias and confusion.   Has had vomiting and diarrhea recently but no fever or cough.  Per prior chart, pt had a right anterior cerebral artery infarct in 05/2005.  Presented w/ intermittent LLE weakness at that time.     Past Medical History  Diagnosis Date  . Diabetes mellitus   . Hyperlipidemia   . Hypertension   . chronic back pain   . GERD (gastroesophageal reflux disease)   . Cerebrovascular accident   . Anxiety   . Depression   . Allergy history unknown   . Hemorrhoids   . Gastroparesis     78% RETENTION AT 120 MIN  . Hiatal hernia     Past Surgical History  Procedure Date  . Tonsillectomy   . Tubal ligation 1975  . Abdominal hysterectomy 1982  . Appendectomy 1982  . Breast biopsy 1975    Lt breast, benign tumor  . Eptopic pregancy 1982     Family History  Problem Relation Age of Onset  . Cervical cancer Mother   . Stomach cancer Mother   . Diabetes Sister     x3 sisters  . Diabetes Brother     x1 brother  . Hypertension    . Breast cancer Sister     oldest sister  . Prostate cancer Father   . Prostate cancer Brother   . Heart attack    . Colon cancer Neg Hx     History  Substance Use Topics  . Smoking status: Never Smoker   . Smokeless tobacco: Never Used  . Alcohol Use: No    OB History    Grav Para Term Preterm Abortions TAB SAB Ect Mult  Living                  Review of Systems  All other systems reviewed and are negative.    Allergies  Ezetimibe-simvastatin  Home Medications   Current Outpatient Rx  Name Route Sig Dispense Refill  . AMLODIPINE BESYLATE 10 MG PO TABS Oral Take 10 mg by mouth daily.      . ASPIRIN EC 81 MG PO TBEC Oral Take 81 mg by mouth daily.    . BUPROPION HCL ER (XL) 300 MG PO TB24 Oral Take 300 mg by mouth daily.    Marland Kitchen METFORMIN HCL 1000 MG PO TABS Oral Take 1,000 mg by mouth 2 (two) times daily with a meal.    . MIRTAZAPINE 30 MG PO TABS Oral Take 30 mg by mouth at bedtime.    Marland Kitchen PANTOPRAZOLE SODIUM 40 MG PO TBEC Oral Take 40 mg by mouth daily.    Marland Kitchen POTASSIUM CHLORIDE CRYS ER 20 MEQ PO TBCR Oral Take 20 mEq by mouth 2 (two) times daily.      Marland Kitchen VALSARTAN-HYDROCHLOROTHIAZIDE 320-25 MG PO TABS Oral Take 1 tablet by mouth daily.      . ATORVASTATIN CALCIUM  20 MG PO TABS Oral Take 20 mg by mouth at bedtime.      Marland Kitchen LORAZEPAM 0.5 MG PO TABS Oral Take 0.5 mg by mouth 3 (three) times daily. Take 1 tablet in the am and 2 in the evening and 1 as needed For anxiety    . SITAGLIPTIN PHOSPHATE 100 MG PO TABS Oral Take 100 mg by mouth daily.        BP 131/77  Pulse 77  Temp(Src) 98.1 F (36.7 C) (Oral)  Resp 20  SpO2 96%  Physical Exam  Nursing note and vitals reviewed. Constitutional: She is oriented to person, place, and time. She appears well-developed and well-nourished. No distress.  HENT:  Head: Normocephalic and atraumatic.  Eyes:       Normal appearance  Neck: Normal range of motion.  Cardiovascular: Normal rate, regular rhythm and intact distal pulses.   Pulmonary/Chest: Effort normal and breath sounds normal.  Musculoskeletal: Normal range of motion.  Neurological: She is alert and oriented to person, place, and time. She has normal reflexes. She displays no tremor. No cranial nerve deficit or sensory deficit. She displays a negative Romberg sign.       Slightly decreased LLE strength  compared to right.    No pronator drift.  No nystagmus.  Past pointing.  Stuttering speech.  Shuffling gait and pt reports dizziness and lack of balance w/ ambulation.   Skin: Skin is warm and dry. No rash noted.  Psychiatric: She has a normal mood and affect. Her behavior is normal.    ED Course  Procedures (including critical care time)  Labs Reviewed  CBC - Abnormal; Notable for the following:    Hemoglobin 11.2 (*)    HCT 34.4 (*)    All other components within normal limits  DIFFERENTIAL  PROTIME-INR  APTT  URINALYSIS, ROUTINE W REFLEX MICROSCOPIC  URINE CULTURE  COMPREHENSIVE METABOLIC PANEL   Ct Head Wo Contrast  02/13/2011  *RADIOLOGY REPORT*  Clinical Data: Bilateral lower extremity weakness.  CT HEAD WITHOUT CONTRAST  Technique:  Contiguous axial images were obtained from the base of the skull through the vertex without contrast.  Comparison: Brain MRI 10/25/2010 and head CT scan 10/21/2010.  Findings: Chronic microvascular ischemic change is again seen.  No evidence of acute infarction, hemorrhage, mass lesion, mass effect, midline shift or abnormal extra-axial fluid collection.  No hydrocephalus or pneumocephalus.  Calvarium intact.  IMPRESSION: No acute finding.  Chronic microvascular ischemic change.  Original Report Authenticated By: Bernadene Bell. D'ALESSIO, M.D.   Dg Pneumonia Chest 2v  02/13/2011  *RADIOLOGY REPORT*  Clinical Data: Shortness of breath and slurred speech.  CHEST - 2 VIEW  Comparison: PA and lateral chest 09/13/2007.  Findings: Lung volumes are lower than on the comparison study. There is cardiomegaly but no edema.  No pneumothorax or pleural effusion.  IMPRESSION: Cardiomegaly without acute disease.  Original Report Authenticated By: Bernadene Bell. D'ALESSIO, M.D.     1. Vertigo   2. Aphasia       MDM  Pt w/ h/o CVA w/ residual left-sided weakness presents w/ c/o stammering speech, ataxia and intermittent dizziness.  Onset of or time of acute worsening of sx  is unclear after speaking to both patient and her family.  Exam sig for expressive aphasia, mild LLE weakness compared to right and shuffling gait.  CT head, CXR, U/A and labs pending.    CT head, CXR and labs (w/ exception of U/A which is still pending) are unremarkable.  All discussed w/ pt and her family.  MRI brain pending.  6:09 PM   MRI brain w/out acute stroke.  Dizziness most likely BPPV. Consulted Dr. Dorothe Pea and he would be happy to see patient in the office tomorrow.  Pt and her family are comfortable w/ this plan.  D/c'd home w/ meclizine to trial when she experiences dizziness.  Advised her to use her walker at all times to prevent a fall.    Medical screening examination/treatment/procedure(s) were performed by non-physician practitioner and as supervising physician I was immediately available for consultation/collaboration. Osvaldo Human, M.D.     Arie Sabina Hobson, PA 02/14/11 0130  Carleene Cooper III, MD 03/11/11 1247

## 2011-02-13 NOTE — ED Notes (Signed)
Patient transported to X-ray 

## 2011-02-13 NOTE — ED Notes (Signed)
Patient transported to MRI 

## 2011-02-13 NOTE — ED Provider Notes (Signed)
4:09 PM  Date: 02/13/2011  Rate: 77  Rhythm: normal sinus rhythm  QRS Axis: normal  Intervals: normal PQRS:  Left atrial abnormality  ST/T Wave abnormalities: normal  Conduction Disutrbances:none  Narrative Interpretation: Borderline EKG  Old EKG Reviewed: unchanged    Carleene Cooper III, MD 02/13/11 [phone]

## 2011-02-13 NOTE — ED Notes (Signed)
PER EMS-Pt has been having difficulty articulating speech and with gait x 1 week. Reporting gait problem worsened today at 1100. Pt typically ambulates with walker. Pt has hx of TIA/CVA, has deficits on left side. Pt wanted to get evaluated due to hx and S/S of gait difficulty, speech articulation. Pt vomited during EMS ride, given 4 mg zofran. No neuro deficits noted during EMS exam. CBG 212. Pt was alert and oriented with EMS. C/o headache, dizziness. HR 80 SR, 148/104.

## 2011-10-11 ENCOUNTER — Other Ambulatory Visit: Payer: Self-pay | Admitting: Obstetrics and Gynecology

## 2011-10-11 ENCOUNTER — Other Ambulatory Visit (HOSPITAL_COMMUNITY)
Admission: RE | Admit: 2011-10-11 | Discharge: 2011-10-11 | Disposition: A | Discharge status: Home / Self Care | Payer: Medicare (Managed Care) | Source: Ambulatory Visit | Attending: Obstetrics and Gynecology | Admitting: Obstetrics and Gynecology

## 2011-10-11 DIAGNOSIS — Z01419 Encounter for gynecological examination (general) (routine) without abnormal findings: Secondary | ICD-10-CM | POA: Insufficient documentation

## 2011-10-11 DIAGNOSIS — Z1151 Encounter for screening for human papillomavirus (HPV): Secondary | ICD-10-CM | POA: Insufficient documentation

## 2012-02-24 ENCOUNTER — Other Ambulatory Visit: Payer: Self-pay | Admitting: Family Medicine

## 2012-02-24 DIAGNOSIS — Z1231 Encounter for screening mammogram for malignant neoplasm of breast: Secondary | ICD-10-CM

## 2012-03-21 ENCOUNTER — Ambulatory Visit
Admission: RE | Admit: 2012-03-21 | Discharge: 2012-03-21 | Disposition: A | Discharge status: Home / Self Care | Payer: No Typology Code available for payment source | Source: Ambulatory Visit | Attending: Family Medicine | Admitting: Family Medicine

## 2012-03-21 DIAGNOSIS — Z1231 Encounter for screening mammogram for malignant neoplasm of breast: Secondary | ICD-10-CM

## 2012-03-23 ENCOUNTER — Other Ambulatory Visit: Payer: Self-pay | Admitting: Family Medicine

## 2012-03-23 DIAGNOSIS — R928 Other abnormal and inconclusive findings on diagnostic imaging of breast: Secondary | ICD-10-CM

## 2012-04-04 ENCOUNTER — Ambulatory Visit
Admission: RE | Admit: 2012-04-04 | Discharge: 2012-04-04 | Disposition: A | Discharge status: Home / Self Care | Payer: No Typology Code available for payment source | Source: Ambulatory Visit | Attending: Family Medicine | Admitting: Family Medicine

## 2012-04-04 DIAGNOSIS — R928 Other abnormal and inconclusive findings on diagnostic imaging of breast: Secondary | ICD-10-CM

## 2012-05-29 ENCOUNTER — Other Ambulatory Visit: Payer: Medicare (Managed Care)

## 2012-05-29 ENCOUNTER — Other Ambulatory Visit: Payer: Self-pay | Admitting: Family Medicine

## 2012-05-29 ENCOUNTER — Ambulatory Visit
Admission: RE | Admit: 2012-05-29 | Discharge: 2012-05-29 | Disposition: A | Discharge status: Home / Self Care | Payer: No Typology Code available for payment source | Source: Ambulatory Visit | Attending: Family Medicine | Admitting: Family Medicine

## 2012-05-29 DIAGNOSIS — R109 Unspecified abdominal pain: Secondary | ICD-10-CM

## 2012-05-29 DIAGNOSIS — R112 Nausea with vomiting, unspecified: Secondary | ICD-10-CM

## 2012-05-30 ENCOUNTER — Ambulatory Visit
Admission: RE | Admit: 2012-05-30 | Discharge: 2012-05-30 | Disposition: A | Discharge status: Home / Self Care | Payer: No Typology Code available for payment source | Source: Ambulatory Visit | Attending: Family Medicine | Admitting: Family Medicine

## 2012-05-30 DIAGNOSIS — R112 Nausea with vomiting, unspecified: Secondary | ICD-10-CM

## 2012-05-30 DIAGNOSIS — R109 Unspecified abdominal pain: Secondary | ICD-10-CM

## 2012-08-21 ENCOUNTER — Other Ambulatory Visit: Payer: Self-pay | Admitting: *Deleted

## 2012-08-21 ENCOUNTER — Ambulatory Visit
Admission: RE | Admit: 2012-08-21 | Discharge: 2012-08-21 | Disposition: A | Discharge status: Home / Self Care | Payer: Self-pay | Source: Ambulatory Visit | Attending: *Deleted | Admitting: *Deleted

## 2012-08-21 DIAGNOSIS — R062 Wheezing: Secondary | ICD-10-CM

## 2012-08-21 DIAGNOSIS — R05 Cough: Secondary | ICD-10-CM

## 2012-08-21 DIAGNOSIS — R053 Chronic cough: Secondary | ICD-10-CM

## 2012-08-21 DIAGNOSIS — R0689 Other abnormalities of breathing: Secondary | ICD-10-CM

## 2013-11-04 ENCOUNTER — Other Ambulatory Visit: Payer: Self-pay | Admitting: Family Medicine

## 2013-11-04 DIAGNOSIS — R102 Pelvic and perineal pain: Secondary | ICD-10-CM

## 2013-11-07 ENCOUNTER — Ambulatory Visit
Admission: RE | Admit: 2013-11-07 | Discharge: 2013-11-07 | Disposition: A | Discharge status: Home / Self Care | Payer: No Typology Code available for payment source | Source: Ambulatory Visit | Attending: Family Medicine | Admitting: Family Medicine

## 2013-11-07 DIAGNOSIS — R102 Pelvic and perineal pain: Secondary | ICD-10-CM

## 2013-11-19 ENCOUNTER — Other Ambulatory Visit: Payer: Self-pay | Admitting: Family Medicine

## 2013-11-19 ENCOUNTER — Ambulatory Visit
Admission: RE | Admit: 2013-11-19 | Discharge: 2013-11-19 | Disposition: A | Discharge status: Home / Self Care | Payer: No Typology Code available for payment source | Source: Ambulatory Visit | Attending: Family Medicine | Admitting: Family Medicine

## 2013-11-19 DIAGNOSIS — R52 Pain, unspecified: Secondary | ICD-10-CM

## 2014-05-29 ENCOUNTER — Encounter: Payer: Self-pay | Admitting: Gastroenterology

## 2015-02-27 ENCOUNTER — Other Ambulatory Visit: Payer: Self-pay | Admitting: Family Medicine

## 2015-02-27 DIAGNOSIS — M545 Low back pain: Secondary | ICD-10-CM

## 2015-03-05 ENCOUNTER — Ambulatory Visit
Admission: RE | Admit: 2015-03-05 | Discharge: 2015-03-05 | Disposition: A | Discharge status: Home / Self Care | Payer: No Typology Code available for payment source | Source: Ambulatory Visit | Attending: Family Medicine | Admitting: Family Medicine

## 2015-03-05 DIAGNOSIS — M545 Low back pain: Secondary | ICD-10-CM

## 2015-03-05 MED ORDER — GADOBENATE DIMEGLUMINE 529 MG/ML IV SOLN
14.0000 mL | Freq: Once | INTRAVENOUS | Status: AC | PRN
Start: 2015-03-05 — End: 2015-03-05
  Administered 2015-03-05: 14 mL via INTRAVENOUS

## 2015-03-12 ENCOUNTER — Other Ambulatory Visit: Payer: Self-pay | Admitting: Family Medicine

## 2015-03-12 DIAGNOSIS — M545 Low back pain: Principal | ICD-10-CM

## 2015-03-12 DIAGNOSIS — G8929 Other chronic pain: Secondary | ICD-10-CM

## 2015-03-17 ENCOUNTER — Other Ambulatory Visit: Payer: No Typology Code available for payment source

## 2015-03-19 ENCOUNTER — Ambulatory Visit
Admission: RE | Admit: 2015-03-19 | Discharge: 2015-03-19 | Disposition: A | Discharge status: Home / Self Care | Payer: No Typology Code available for payment source | Source: Ambulatory Visit | Attending: Family Medicine | Admitting: Family Medicine

## 2015-03-19 DIAGNOSIS — M545 Low back pain, unspecified: Secondary | ICD-10-CM

## 2015-03-19 DIAGNOSIS — G8929 Other chronic pain: Secondary | ICD-10-CM

## 2015-03-19 MED ORDER — IOHEXOL 180 MG/ML  SOLN
1.0000 mL | Freq: Once | INTRAMUSCULAR | Status: AC | PRN
  Administered 2015-03-19: 1 mL via EPIDURAL

## 2015-03-19 MED ORDER — METHYLPREDNISOLONE ACETATE 40 MG/ML INJ SUSP (RADIOLOG
120.0000 mg | Freq: Once | INTRAMUSCULAR | Status: AC
  Administered 2015-03-19: 120 mg via EPIDURAL

## 2015-03-19 NOTE — Discharge Instructions (Signed)

## 2015-04-16 ENCOUNTER — Other Ambulatory Visit: Payer: Self-pay | Admitting: Family Medicine

## 2015-04-16 DIAGNOSIS — M48061 Spinal stenosis, lumbar region without neurogenic claudication: Secondary | ICD-10-CM

## 2015-04-16 DIAGNOSIS — G8929 Other chronic pain: Secondary | ICD-10-CM

## 2015-04-16 DIAGNOSIS — M545 Low back pain, unspecified: Secondary | ICD-10-CM

## 2015-04-23 ENCOUNTER — Ambulatory Visit
Admission: RE | Admit: 2015-04-23 | Discharge: 2015-04-23 | Disposition: A | Discharge status: Home / Self Care | Payer: No Typology Code available for payment source | Source: Ambulatory Visit | Attending: Family Medicine | Admitting: Family Medicine

## 2015-04-23 DIAGNOSIS — G8929 Other chronic pain: Secondary | ICD-10-CM

## 2015-04-23 DIAGNOSIS — M545 Low back pain: Principal | ICD-10-CM

## 2015-04-23 DIAGNOSIS — M48061 Spinal stenosis, lumbar region without neurogenic claudication: Secondary | ICD-10-CM

## 2015-04-23 MED ORDER — IOHEXOL 180 MG/ML  SOLN
1.0000 mL | Freq: Once | INTRAMUSCULAR | Status: AC | PRN
  Administered 2015-04-23: 1 mL via EPIDURAL

## 2015-04-23 MED ORDER — METHYLPREDNISOLONE ACETATE 40 MG/ML INJ SUSP (RADIOLOG
120.0000 mg | Freq: Once | INTRAMUSCULAR | Status: AC
  Administered 2015-04-23: 120 mg via EPIDURAL

## 2015-04-23 NOTE — Discharge Instructions (Signed)

## 2015-11-18 ENCOUNTER — Other Ambulatory Visit: Payer: Self-pay | Admitting: Family Medicine

## 2015-11-18 DIAGNOSIS — R1030 Lower abdominal pain, unspecified: Secondary | ICD-10-CM

## 2015-11-24 ENCOUNTER — Ambulatory Visit
Admission: RE | Admit: 2015-11-24 | Discharge: 2015-11-24 | Disposition: A | Discharge status: Home / Self Care | Payer: No Typology Code available for payment source | Source: Ambulatory Visit | Attending: Family Medicine | Admitting: Family Medicine

## 2015-11-24 DIAGNOSIS — R1030 Lower abdominal pain, unspecified: Secondary | ICD-10-CM

## 2015-12-04 ENCOUNTER — Encounter: Payer: Self-pay | Admitting: Gastroenterology

## 2016-06-08 ENCOUNTER — Ambulatory Visit
Admission: RE | Admit: 2016-06-08 | Discharge: 2016-06-08 | Disposition: A | Discharge status: Home / Self Care | Payer: No Typology Code available for payment source | Source: Ambulatory Visit | Attending: Family Medicine | Admitting: Family Medicine

## 2016-06-08 ENCOUNTER — Other Ambulatory Visit: Payer: Self-pay | Admitting: Family Medicine

## 2016-06-08 DIAGNOSIS — M25511 Pain in right shoulder: Secondary | ICD-10-CM

## 2016-07-05 ENCOUNTER — Other Ambulatory Visit: Payer: Self-pay | Admitting: Nurse Practitioner

## 2016-07-05 ENCOUNTER — Ambulatory Visit
Admission: RE | Admit: 2016-07-05 | Discharge: 2016-07-05 | Disposition: A | Discharge status: Home / Self Care | Payer: No Typology Code available for payment source | Source: Ambulatory Visit | Attending: Nurse Practitioner | Admitting: Nurse Practitioner

## 2016-07-05 DIAGNOSIS — M25511 Pain in right shoulder: Secondary | ICD-10-CM

## 2017-03-02 ENCOUNTER — Other Ambulatory Visit: Payer: Self-pay | Admitting: Family Medicine

## 2017-03-02 DIAGNOSIS — K219 Gastro-esophageal reflux disease without esophagitis: Secondary | ICD-10-CM

## 2017-03-09 ENCOUNTER — Ambulatory Visit
Admission: RE | Admit: 2017-03-09 | Discharge: 2017-03-09 | Disposition: A | Discharge status: Home / Self Care | Payer: No Typology Code available for payment source | Source: Ambulatory Visit | Attending: Family Medicine | Admitting: Family Medicine

## 2017-03-09 DIAGNOSIS — K219 Gastro-esophageal reflux disease without esophagitis: Secondary | ICD-10-CM

## 2017-08-28 ENCOUNTER — Encounter: Payer: Self-pay | Admitting: Obstetrics & Gynecology

## 2017-10-16 ENCOUNTER — Ambulatory Visit (INDEPENDENT_AMBULATORY_CARE_PROVIDER_SITE_OTHER): Payer: Medicare Other | Admitting: Obstetrics and Gynecology

## 2017-10-16 ENCOUNTER — Encounter: Payer: Self-pay | Admitting: Obstetrics and Gynecology

## 2017-10-16 VITALS — BP 140/68 | HR 78 | Ht 64.0 in | Wt 182.0 lb

## 2017-10-16 DIAGNOSIS — N898 Other specified noninflammatory disorders of vagina: Secondary | ICD-10-CM | POA: Diagnosis not present

## 2017-10-16 LAB — POCT WET PREP WITH KOH
Clue Cells Wet Prep HPF POC: NEGATIVE
KOH Prep POC: NEGATIVE
Trichomonas, UA: NEGATIVE
Yeast Wet Prep HPF POC: NEGATIVE

## 2017-10-16 NOTE — Progress Notes (Signed)
Olivia Fleming, NP   Chief Complaint  Patient presents with  . Vaginitis    x over 6 months, a lot of discharge color discharge, itchiness    HPI:      [name] is a 77 y.o. No obstetric history on file. who LMP was No LMP recorded. Patient has had a hysterectomy., presents today for NP eval of increased vag d/c and itching with a horrible odor per pt report, for over 6 months, referred by [facility]. Pt has treated with nystatin crm ext without relief. She tried douching twice due to the odor without relief. She also treated with an OTC crm (question Kind) for 5 days. No vaginal bleeding/spotting. Hx of hyst due to ectopic pregnancy. She is not sex active. No UTI sx. No recent abx use. Hx of DM and thinks her blood sugars are controlled with meds.    Past Medical History:  Diagnosis Date  . Allergy history unknown   . Anxiety   . Cerebrovascular accident ([employer])   . chronic back pain   . Depression   . Diabetes mellitus   . Gastroparesis    78% RETENTION AT 120 MIN  . GERD (gastroesophageal reflux disease)   . Hemorrhoids   . Hiatal hernia   . Hyperlipidemia   . Hypertension     Past Surgical History:  Procedure Laterality Date  . ABDOMINAL HYSTERECTOMY  1982  . APPENDECTOMY  1982  . BREAST BIOPSY  1975   Lt breast, benign tumor  . eptopic pregancy 1982    . TONSILLECTOMY    . TUBAL LIGATION  1975    Family History  Problem Relation Age of Onset  . Cervical cancer Mother   . Stomach cancer Mother   . Diabetes Sister        x3 sisters  . Diabetes Brother        x1 brother  . Hypertension Unknown   . Breast cancer Sister        oldest sister  . Prostate cancer Father   . Prostate cancer Brother   . Heart attack Unknown   . Colon cancer Neg Hx     Social History   Socioeconomic History  . Marital status: Married    Spouse name: Not on file  . Number of children: Not on file  . Years of education: Not on file  . Highest  education level: Not on file  Occupational History  . Not on file  Social Needs  . Financial resource strain: Not on file  . Food insecurity:    Worry: Not on file    Inability: Not on file  . Transportation needs:    Medical: Not on file    Non-medical: Not on file  Tobacco Use  . Smoking status: Never Smoker  . Smokeless tobacco: Never Used  Substance and Sexual Activity  . Alcohol use: No  . Drug use: No  . Sexual activity: Not on file  Lifestyle  . Physical activity:    Days per week: Not on file    Minutes per session: Not on file  . Stress: Not on file  Relationships  . Social connections:    Talks on phone: Not on file    Gets together: Not on file    Attends religious service: Not on file    Active member of club or organization: Not on file    Attends meetings of clubs or organizations: Not on file  Relationship status: Not on file  . Intimate partner violence:    Fear of current or ex partner: Not on file    Emotionally abused: Not on file    Physically abused: Not on file    Forced sexual activity: Not on file  Other Topics Concern  . Not on file  Social History Narrative  . Not on file    Outpatient Medications Prior to Visit  Medication Sig Dispense Refill  . acetaminophen (TYLENOL) 325 MG suppository Place rectally.    Marland Kitchen aspirin EC 81 MG tablet Take 81 mg by mouth daily.    . Cholecalciferol (VITAMIN D3) 50000 units TABS Take by mouth.    . erythromycin (ERY-TAB) 250 MG EC tablet     . glimepiride (AMARYL) 4 MG tablet     . HYDROcodone-acetaminophen (NORCO/VICODIN) 5-325 MG tablet     . JANUVIA 100 MG tablet     . LORazepam (ATIVAN) 0.5 MG tablet Take 0.5 mg by mouth 3 (three) times daily. Take 1 tablet in the am and 2 in the evening and 1 as needed For anxiety    . metFORMIN (GLUCOPHAGE) 1000 MG tablet Take 1,000 mg by mouth 2 (two) times daily with a meal.    . Olmesartan-amLODIPine-HCTZ 40-5-12.5 MG TABS     . PROLENSA 0.07 % SOLN     .  rosuvastatin (CRESTOR) 10 MG tablet     . senna-docusate (SENEXON-S) 8.6-50 MG tablet Take by mouth.    . sertraline (ZOLOFT) 100 MG tablet     . amLODipine (NORVASC) 10 MG tablet Take 10 mg by mouth daily.      Marland Kitchen atorvastatin (LIPITOR) 20 MG tablet Take 20 mg by mouth at bedtime.      Marland Kitchen buPROPion (WELLBUTRIN XL) 300 MG 24 hr tablet Take 300 mg by mouth daily.    . mirtazapine (REMERON) 30 MG tablet Take 30 mg by mouth at bedtime.    . pantoprazole (PROTONIX) 40 MG tablet Take 40 mg by mouth daily.    . potassium chloride SA (KLOR-CON M20) 20 MEQ tablet Take 20 mEq by mouth 2 (two) times daily.      . valsartan-hydrochlorothiazide (DIOVAN HCT) 320-25 MG per tablet Take 1 tablet by mouth daily.       No facility-administered medications prior to visit.     ROS:  Review of Systems  Constitutional: Negative for fatigue, fever and unexpected weight change.  Respiratory: Negative for cough, shortness of breath and wheezing.   Cardiovascular: Negative for chest pain, palpitations and leg swelling.  Gastrointestinal: Negative for blood in stool, constipation, diarrhea, nausea and vomiting.  Endocrine: Negative for cold intolerance, heat intolerance and polyuria.  Genitourinary: Positive for vaginal discharge. Negative for dyspareunia, dysuria, flank pain, frequency, genital sores, hematuria, menstrual problem, pelvic pain, urgency, vaginal bleeding and vaginal pain.  Musculoskeletal: Negative for back pain, joint swelling and myalgias.  Skin: Negative for rash.  Neurological: Negative for dizziness, syncope, light-headedness, numbness and headaches.  Hematological: Negative for adenopathy.  Psychiatric/Behavioral: Negative for agitation, confusion, sleep disturbance and suicidal ideas. The patient is not nervous/anxious.     OBJECTIVE:   Vitals:  BP 140/68   Pulse 78   Ht 5\' 4"  (1.626 m)   Wt 182 lb (82.6 kg)   BMI 31.24 kg/m   Physical Exam  Constitutional: She is oriented to  person, place, and time. Vital signs are normal. She appears well-developed.  Pulmonary/Chest: Effort normal.  Genitourinary: Uterus normal. There is no rash,  tenderness or lesion on the right labia. There is no rash, tenderness or lesion on the left labia. Uterus is not enlarged and not tender. Cervix exhibits no motion tenderness. Right adnexum displays no mass and no tenderness. Left adnexum displays no mass and no tenderness. No erythema or tenderness in the vagina. Vaginal discharge found.  Musculoskeletal: Normal range of motion.  Neurological: She is alert and oriented to person, place, and time.  Psychiatric: She has a normal mood and affect. Her behavior is normal. Thought content normal.  Vitals reviewed.   Results: Results for orders placed or performed in visit on 10/16/17 (from the past 24 hour(s))  POCT Wet Prep with KOH     Status: Normal   Collection Time: 10/16/17  3:49 PM  Result Value Ref Range   Trichomonas, UA Negative    Clue Cells Wet Prep HPF POC neg    Epithelial Wet Prep HPF POC     Yeast Wet Prep HPF POC neg    Bacteria Wet Prep HPF POC     RBC Wet Prep HPF POC     WBC Wet Prep HPF POC     KOH Prep POC Negative Negative     Assessment/Plan: Vaginal discharge - Pos d/c on exam/no odor/neg wet prep. Check One Swab AV, BV and yeast. Will call with results.  - Plan: POCT Wet Prep with KOH, Other/Misc lab test    Return if symptoms worsen or fail to improve.  Alicia B. Copland, PA-C 10/16/2017 3:51 PM

## 2017-10-16 NOTE — Patient Instructions (Signed)
I value your feedback and entrusting us with your care. If you get a [facility] patient survey, I would appreciate you taking the time to let us know about your experience today. Thank you! 

## 2017-10-24 ENCOUNTER — Encounter: Payer: Self-pay | Admitting: Obstetrics and Gynecology

## 2017-11-07 ENCOUNTER — Telehealth: Payer: Self-pay | Admitting: Obstetrics and Gynecology

## 2017-11-07 NOTE — Telephone Encounter (Signed)
UTR pt on "home phone". Pt lives in [facility] living facility. LM with her nurse that all culture results came back neg. Neg AV, BV, yeast, mycoplasma, ureasplasma. Pt is s/p hyst. Is she still having sx? Nurse to discuss with pt.  If so, will treat with flagyl empirically.

## 2018-02-08 NOTE — Discharge Instructions (Signed)

## 2018-03-06 ENCOUNTER — Other Ambulatory Visit: Payer: Self-pay

## 2018-03-06 ENCOUNTER — Encounter: Payer: Self-pay | Admitting: *Deleted

## 2018-03-09 NOTE — Anesthesia Preprocedure Evaluation (Addendum)
Anesthesia Evaluation  Patient identified by MRN, date of birth, ID band Patient awake    Reviewed: Allergy & Precautions, NPO status , Patient's Chart, lab work & pertinent test results  History of Anesthesia Complications Negative for: history of anesthetic complications  Airway Mallampati: IV   Neck ROM: Full    Dental  (+) Upper Dentures, Lower Dentures Upper dentures are broken:   Pulmonary neg pulmonary ROS,    Pulmonary exam normal breath sounds clear to auscultation       Cardiovascular hypertension, Normal cardiovascular exam Rhythm:Regular Rate:Normal     Neuro/Psych PSYCHIATRIC DISORDERS Anxiety Depression CVA (left-sided weakness)    GI/Hepatic hiatal hernia, GERD  ,Gastroparesis    Endo/Other  diabetes, Type 2  Renal/GU Renal disease (stage III CKD)     Musculoskeletal   Abdominal   Peds  Hematology negative hematology ROS (+)   Anesthesia Other Findings   Reproductive/Obstetrics                            Anesthesia Physical Anesthesia Plan  ASA: III  Anesthesia Plan: MAC   Post-op Pain Management:    Induction: Intravenous  PONV Risk Score and Plan: 2 and TIVA and Midazolam  Airway Management Planned: Natural Airway  Additional Equipment:   Intra-op Plan:   Post-operative Plan:   Informed Consent: I have reviewed the patients History and Physical, chart, labs and discussed the procedure including the risks, benefits and alternatives for the proposed anesthesia with the patient or authorized representative who has indicated his/her understanding and acceptance.       Plan Discussed with: CRNA  Anesthesia Plan Comments:        Anesthesia Quick Evaluation

## 2018-03-12 ENCOUNTER — Encounter
Admission: RE | Disposition: A | Discharge status: Home / Self Care | Payer: Self-pay | Source: Home / Self Care | Attending: Ophthalmology

## 2018-03-12 ENCOUNTER — Ambulatory Visit: Payer: Medicare Other | Admitting: Anesthesiology

## 2018-03-12 ENCOUNTER — Ambulatory Visit
Admission: RE | Admit: 2018-03-12 | Discharge: 2018-03-12 | Disposition: A | Discharge status: Home / Self Care | Payer: Medicare Other | Attending: Ophthalmology | Admitting: Ophthalmology

## 2018-03-12 DIAGNOSIS — E1122 Type 2 diabetes mellitus with diabetic chronic kidney disease: Secondary | ICD-10-CM | POA: Diagnosis not present

## 2018-03-12 DIAGNOSIS — I129 Hypertensive chronic kidney disease with stage 1 through stage 4 chronic kidney disease, or unspecified chronic kidney disease: Secondary | ICD-10-CM | POA: Insufficient documentation

## 2018-03-12 DIAGNOSIS — N183 Chronic kidney disease, stage 3 (moderate): Secondary | ICD-10-CM | POA: Insufficient documentation

## 2018-03-12 DIAGNOSIS — E1136 Type 2 diabetes mellitus with diabetic cataract: Secondary | ICD-10-CM | POA: Diagnosis present

## 2018-03-12 DIAGNOSIS — H2512 Age-related nuclear cataract, left eye: Secondary | ICD-10-CM | POA: Insufficient documentation

## 2018-03-12 DIAGNOSIS — Z8673 Personal history of transient ischemic attack (TIA), and cerebral infarction without residual deficits: Secondary | ICD-10-CM | POA: Diagnosis not present

## 2018-03-12 HISTORY — DX: Chronic kidney disease, stage 3 (moderate): N18.3

## 2018-03-12 HISTORY — PX: CATARACT EXTRACTION W/PHACO: SHX586

## 2018-03-12 HISTORY — DX: Chronic kidney disease, stage 3 unspecified: N18.30

## 2018-03-12 HISTORY — DX: Spinal stenosis, lumbar region without neurogenic claudication: M48.061

## 2018-03-12 LAB — GLUCOSE, CAPILLARY
Glucose-Capillary: 180 mg/dL — ABNORMAL HIGH (ref 70–99)
Glucose-Capillary: 181 mg/dL — ABNORMAL HIGH (ref 70–99)

## 2018-03-12 SURGERY — PHACOEMULSIFICATION, CATARACT, WITH IOL INSERTION
Anesthesia: Monitor Anesthesia Care | Site: Eye | Laterality: Left

## 2018-03-12 MED ORDER — SODIUM HYALURONATE 10 MG/ML IO SOLN
INTRAOCULAR | Status: DC | PRN
  Administered 2018-03-12: 0.55 mL via INTRAOCULAR

## 2018-03-12 MED ORDER — FENTANYL CITRATE (PF) 100 MCG/2ML IJ SOLN
INTRAMUSCULAR | Status: DC | PRN
  Administered 2018-03-12 (×2): 50 ug via INTRAVENOUS

## 2018-03-12 MED ORDER — LACTATED RINGERS IV SOLN: 10.0000 mL/h | INTRAVENOUS | Status: DC

## 2018-03-12 MED ORDER — ONDANSETRON HCL 4 MG/2ML IJ SOLN: 4.0000 mg | Freq: Once | INTRAMUSCULAR | Status: DC | PRN

## 2018-03-12 MED ORDER — LIDOCAINE HCL (PF) 2 % IJ SOLN
INTRAOCULAR | Status: DC | PRN
  Administered 2018-03-12: 2 mL via INTRAOCULAR

## 2018-03-12 MED ORDER — SODIUM HYALURONATE 23 MG/ML IO SOLN
INTRAOCULAR | Status: DC | PRN
  Administered 2018-03-12: 0.6 mL via INTRAOCULAR

## 2018-03-12 MED ORDER — MOXIFLOXACIN HCL 0.5 % OP SOLN
OPHTHALMIC | Status: DC | PRN
  Administered 2018-03-12: 0.2 mL via OPHTHALMIC

## 2018-03-12 MED ORDER — MIDAZOLAM HCL 2 MG/2ML IJ SOLN
INTRAMUSCULAR | Status: DC | PRN
  Administered 2018-03-12 (×2): 1 mg via INTRAVENOUS

## 2018-03-12 MED ORDER — EPINEPHRINE PF 1 MG/ML IJ SOLN
INTRAOCULAR | Status: DC | PRN
  Administered 2018-03-12: 86 mL via OPHTHALMIC

## 2018-03-12 MED ORDER — ARMC OPHTHALMIC DILATING DROPS
1.0000 "application " | OPHTHALMIC | Status: DC | PRN
  Administered 2018-03-12 (×3): 1 via OPHTHALMIC

## 2018-03-12 MED ORDER — TETRACAINE HCL 0.5 % OP SOLN
1.0000 [drp] | OPHTHALMIC | Status: DC | PRN
  Administered 2018-03-12 (×3): 1 [drp] via OPHTHALMIC

## 2018-03-12 SURGICAL SUPPLY — 19 items
CANNULA ANT/CHMB 27G (MISCELLANEOUS) ×2 IMPLANT
CANNULA ANT/CHMB 27GA (MISCELLANEOUS) ×4 IMPLANT
DISSECTOR HYDRO NUCLEUS 50X22 (MISCELLANEOUS) ×2 IMPLANT
GLOVE SURG LX 7.5 STRW (GLOVE) ×1
GLOVE SURG LX STRL 7.5 STRW (GLOVE) ×1 IMPLANT
GLOVE SURG SYN 8.5  E (GLOVE) ×1
GLOVE SURG SYN 8.5 E (GLOVE) ×1 IMPLANT
GLOVE SURG SYN 8.5 PF PI (GLOVE) ×1 IMPLANT
GOWN STRL REUS W/ TWL LRG LVL3 (GOWN DISPOSABLE) ×2 IMPLANT
GOWN STRL REUS W/TWL LRG LVL3 (GOWN DISPOSABLE) ×4
LENS IOL TECNIS ITEC 21.5 (Intraocular Lens) ×1 IMPLANT
MARKER SKIN DUAL TIP RULER LAB (MISCELLANEOUS) ×2 IMPLANT
PACK DR. KING ARMS (PACKS) ×2 IMPLANT
PACK EYE AFTER SURG (MISCELLANEOUS) ×2 IMPLANT
PACK OPTHALMIC (MISCELLANEOUS) ×2 IMPLANT
SYR 3ML LL SCALE MARK (SYRINGE) ×2 IMPLANT
SYR TB 1ML LUER SLIP (SYRINGE) ×2 IMPLANT
WATER STERILE IRR 500ML POUR (IV SOLUTION) ×2 IMPLANT
WIPE NON LINTING 3.25X3.25 (MISCELLANEOUS) ×2 IMPLANT

## 2018-03-12 NOTE — Transfer of Care (Signed)
Immediate Anesthesia Transfer of Care Note  Patient: [name]  Procedure(s) Performed: CATARACT EXTRACTION PHACO AND INTRAOCULAR LENS PLACEMENT (IOC) LEFT  DIABETES (Left Eye)  Patient Location: PACU  Anesthesia Type: MAC  Level of Consciousness: awake, alert  and patient cooperative  Airway and Oxygen Therapy: Patient Spontanous Breathing and Patient connected to supplemental oxygen  Post-op Assessment: Post-op Vital signs reviewed, Patient's Cardiovascular Status Stable, Respiratory Function Stable, Patent Airway and No signs of Nausea or vomiting  Post-op Vital Signs: Reviewed and stable  Complications: No apparent anesthesia complications

## 2018-03-12 NOTE — Anesthesia Postprocedure Evaluation (Signed)
Anesthesia Post Note  Patient: [name]  Procedure(s) Performed: CATARACT EXTRACTION PHACO AND INTRAOCULAR LENS PLACEMENT (IOC) LEFT  DIABETES (Left Eye)  Patient location during evaluation: PACU Anesthesia Type: MAC Level of consciousness: awake and alert, oriented and patient cooperative Pain management: pain level controlled Vital Signs Assessment: post-procedure vital signs reviewed and stable Respiratory status: spontaneous breathing, nonlabored ventilation and respiratory function stable Cardiovascular status: blood pressure returned to baseline and stable Postop Assessment: adequate PO intake Anesthetic complications: no    Darrin Nipper

## 2018-03-12 NOTE — Anesthesia Procedure Notes (Signed)
Procedure Name: MAC Date/Time: 03/12/2018 8:07 AM Performed by: Janna Arch, CRNA Pre-anesthesia Checklist: Patient identified, Emergency Drugs available, Suction available, Timeout performed and Patient being monitored Patient Re-evaluated:Patient Re-evaluated prior to induction Oxygen Delivery Method: Nasal cannula Placement Confirmation: positive ETCO2

## 2018-03-12 NOTE — H&P (Signed)

## 2018-03-12 NOTE — Op Note (Signed)
OPERATIVE NOTE  [name] [id number] 03/12/2018   PREOPERATIVE DIAGNOSIS:  Nuclear sclerotic cataract left eye.  H25.12   POSTOPERATIVE DIAGNOSIS:    Nuclear sclerotic cataract left eye.     PROCEDURE:  Phacoemusification with posterior chamber intraocular lens placement of the left eye   LENS:   Implant Name Type Inv. Item Serial No. Manufacturer Lot No. LRB No. Used  LENS IOL DIOP 21.5 - [id number] Intraocular Lens LENS IOL DIOP 21.5 [id number] AMO  Left 1       0   ULTRASOUND TIME: 0 minutes 57 seconds.  CDE 8.08   SURGEON:  Benay Pillow, MD, MPH   ANESTHESIA:  Topical with tetracaine drops augmented with 1% preservative-free intracameral lidocaine.  ESTIMATED BLOOD LOSS: <1 mL   COMPLICATIONS:  None.   DESCRIPTION OF PROCEDURE:  The patient was identified in the holding room and transported to the operating room and placed in the supine position under the operating microscope.  The left eye was identified as the operative eye and it was prepped and draped in the usual sterile ophthalmic fashion.   A 1.0 millimeter clear-corneal paracentesis was made at the 5:00 position. 0.5 ml of preservative-free 1% lidocaine with epinephrine was injected into the anterior chamber.  The anterior chamber was filled with Healon 5 viscoelastic.  A 2.4 millimeter keratome was used to make a near-clear corneal incision at the 2:00 position.  A curvilinear capsulorrhexis was made with a cystotome and capsulorrhexis forceps.  Balanced salt solution was used to hydrodissect and hydrodelineate the nucleus.   Phacoemulsification was then used in stop and chop fashion to remove the lens nucleus and epinucleus.  The remaining cortex was then removed using the irrigation and aspiration handpiece. Healon was then placed into the capsular bag to distend it for lens placement.  A lens was then injected into the capsular bag.  The remaining viscoelastic was aspirated.   Wounds were hydrated with  balanced salt solution.  The anterior chamber was inflated to a physiologic pressure with balanced salt solution.  Intracameral vigamox 0.1 mL undiltued was injected into the eye and a drop placed onto the ocular surface.  No wound leaks were noted.  The patient was taken to the recovery room in stable condition without complications of anesthesia or surgery  Benay Pillow 03/12/2018, 8:32 AM

## 2018-03-13 ENCOUNTER — Encounter: Payer: Self-pay | Admitting: Ophthalmology

## 2020-07-30 ENCOUNTER — Encounter: Payer: Self-pay | Admitting: Emergency Medicine

## 2020-07-30 ENCOUNTER — Emergency Department: Payer: Medicare Other

## 2020-07-30 ENCOUNTER — Other Ambulatory Visit: Payer: Self-pay

## 2020-07-30 ENCOUNTER — Emergency Department
Admission: EM | Admit: 2020-07-30 | Discharge: 2020-07-30 | Disposition: A | Discharge status: Home / Self Care | Payer: Medicare Other | Attending: Emergency Medicine | Admitting: Emergency Medicine

## 2020-07-30 DIAGNOSIS — E1122 Type 2 diabetes mellitus with diabetic chronic kidney disease: Secondary | ICD-10-CM | POA: Diagnosis not present

## 2020-07-30 DIAGNOSIS — Z7984 Long term (current) use of oral hypoglycemic drugs: Secondary | ICD-10-CM | POA: Diagnosis not present

## 2020-07-30 DIAGNOSIS — W19XXXA Unspecified fall, initial encounter: Secondary | ICD-10-CM | POA: Diagnosis not present

## 2020-07-30 DIAGNOSIS — Z79899 Other long term (current) drug therapy: Secondary | ICD-10-CM | POA: Diagnosis not present

## 2020-07-30 DIAGNOSIS — S7001XA Contusion of right hip, initial encounter: Secondary | ICD-10-CM | POA: Diagnosis not present

## 2020-07-30 DIAGNOSIS — R296 Repeated falls: Secondary | ICD-10-CM

## 2020-07-30 DIAGNOSIS — S299XXA Unspecified injury of thorax, initial encounter: Secondary | ICD-10-CM | POA: Diagnosis present

## 2020-07-30 DIAGNOSIS — Z7982 Long term (current) use of aspirin: Secondary | ICD-10-CM | POA: Diagnosis not present

## 2020-07-30 DIAGNOSIS — J168 Pneumonia due to other specified infectious organisms: Secondary | ICD-10-CM | POA: Insufficient documentation

## 2020-07-30 DIAGNOSIS — T07XXXA Unspecified multiple injuries, initial encounter: Secondary | ICD-10-CM

## 2020-07-30 DIAGNOSIS — N183 Chronic kidney disease, stage 3 unspecified: Secondary | ICD-10-CM | POA: Insufficient documentation

## 2020-07-30 DIAGNOSIS — I129 Hypertensive chronic kidney disease with stage 1 through stage 4 chronic kidney disease, or unspecified chronic kidney disease: Secondary | ICD-10-CM | POA: Diagnosis not present

## 2020-07-30 DIAGNOSIS — F0390 Unspecified dementia without behavioral disturbance: Secondary | ICD-10-CM | POA: Diagnosis not present

## 2020-07-30 DIAGNOSIS — J189 Pneumonia, unspecified organism: Secondary | ICD-10-CM

## 2020-07-30 DIAGNOSIS — S20211A Contusion of right front wall of thorax, initial encounter: Secondary | ICD-10-CM | POA: Insufficient documentation

## 2020-07-30 LAB — CBC WITH DIFFERENTIAL/PLATELET
Abs Immature Granulocytes: 0.05 10*3/uL (ref 0.00–0.07)
Basophils Absolute: 0 10*3/uL (ref 0.0–0.1)
Basophils Relative: 0 %
Eosinophils Absolute: 0.2 10*3/uL (ref 0.0–0.5)
Eosinophils Relative: 2 %
HCT: 39.3 % (ref 36.0–46.0)
Hemoglobin: 12.9 g/dL (ref 12.0–15.0)
Immature Granulocytes: 0 %
Lymphocytes Relative: 19 %
Lymphs Abs: 2.2 10*3/uL (ref 0.7–4.0)
MCH: 29.1 pg (ref 26.0–34.0)
MCHC: 32.8 g/dL (ref 30.0–36.0)
MCV: 88.5 fL (ref 80.0–100.0)
Monocytes Absolute: 0.6 10*3/uL (ref 0.1–1.0)
Monocytes Relative: 5 %
Neutro Abs: 8.3 10*3/uL — ABNORMAL HIGH (ref 1.7–7.7)
Neutrophils Relative %: 74 %
Platelets: 226 10*3/uL (ref 150–400)
RBC: 4.44 MIL/uL (ref 3.87–5.11)
RDW: 14.1 % (ref 11.5–15.5)
WBC: 11.4 10*3/uL — ABNORMAL HIGH (ref 4.0–10.5)
nRBC: 0 % (ref 0.0–0.2)

## 2020-07-30 LAB — URINALYSIS, COMPLETE (UACMP) WITH MICROSCOPIC
Bacteria, UA: NONE SEEN
Bilirubin Urine: NEGATIVE
Glucose, UA: >=500 mg/dL — AB
Hgb urine dipstick: NEGATIVE
Ketones, ur: NEGATIVE mg/dL
Leukocytes,Ua: NEGATIVE
Nitrite: NEGATIVE
Protein, ur: NEGATIVE mg/dL
Specific Gravity, Urine: 1.03 (ref 1.005–1.030)
pH: 5 (ref 5.0–8.0)

## 2020-07-30 LAB — COMPREHENSIVE METABOLIC PANEL WITH GFR
ALT: 19 U/L (ref 0–44)
AST: 31 U/L (ref 15–41)
Albumin: 3.7 g/dL (ref 3.5–5.0)
Alkaline Phosphatase: 54 U/L (ref 38–126)
Anion gap: 12 (ref 5–15)
BUN: 20 mg/dL (ref 8–23)
CO2: 24 mmol/L (ref 22–32)
Calcium: 9.3 mg/dL (ref 8.9–10.3)
Chloride: 101 mmol/L (ref 98–111)
Creatinine, Ser: 1.36 mg/dL — ABNORMAL HIGH (ref 0.44–1.00)
GFR, Estimated: 39 mL/min — ABNORMAL LOW
Glucose, Bld: 366 mg/dL — ABNORMAL HIGH (ref 70–99)
Potassium: 3.5 mmol/L (ref 3.5–5.1)
Sodium: 137 mmol/L (ref 135–145)
Total Bilirubin: 0.9 mg/dL (ref 0.3–1.2)
Total Protein: 7.7 g/dL (ref 6.5–8.1)

## 2020-07-30 MED ORDER — CEFTRIAXONE SODIUM 1 G IJ SOLR
1.0000 g | Freq: Once | INTRAMUSCULAR | Status: AC
  Administered 2020-07-30: 1 g via INTRAMUSCULAR
  Filled 2020-07-30: qty 10

## 2020-07-30 MED ORDER — AZITHROMYCIN 250 MG PO TABS: ORAL_TABLET | ORAL | 0 refills | Status: DC

## 2020-07-30 NOTE — Discharge Instructions (Addendum)
Follow-up with your primary care provider if any continued problems or concerns.  The injection that she received in the emergency department is an antibiotic to start fighting the early pneumonia that was seen on x-ray.  There is also a prescription for Zithromax which is an antibiotic.  You will be sore tomorrow more than you are currently due to your fall.  No fractures were noted on your x-rays.  Continue to drink lots of fluids to stay hydrated.  Return to the emergency department over the holiday weekend if any severe worsening of your symptoms or urgent concerns.

## 2020-07-30 NOTE — ED Provider Notes (Signed)
[facility] Emergency Department Provider Note  ____________________________________________   Event Date/Time   First MD Initiated Contact with Patient 07/30/20 1303     (approximate)  I have reviewed the triage vital signs and the nursing notes.   HISTORY  Chief Complaint Fall   HPI [name] is a 80 y.o. female presents to the ED via EMS from [place] after an unwitnessed fall.  EMS reports that she apparently fell and was found between the dresser and the recliner.  Patient states that due to her stroke and spinal stenosis that she falls frequently.  Patient has a history of chronic right-sided back pain which also causes her to fall.  Patient denies hitting her head however this was unwitnessed.  She denies any visual changes, nausea, vomiting or new injuries.  Currently she denies any pain.         Past Medical History:  Diagnosis Date   Allergy history unknown    Anxiety    Cerebrovascular accident [facility])    some residual left sided weakness   chronic back pain    CKD (chronic kidney disease), stage III ([place])    Depression    Diabetes mellitus    type 2   Gastroparesis    78% RETENTION AT 120 MIN   GERD (gastroesophageal reflux disease)    Hemorrhoids    Hiatal hernia    Hyperlipidemia    Hypertension    Spinal stenosis of lumbar region     Patient Active Problem List   Diagnosis Date Noted   Chest pain, non-cardiac 05/17/2010   Constipation 05/17/2010   POLYARTHRITIS 04/05/2010   POSTMENOPAUSAL STATUS 02/15/2010   TINEA CORPORIS 10/20/2009   VAGINAL DISCHARGE 08/14/2009   GASTROPARESIS 06/30/2009   WEAKNESS 06/08/2009   FALL, HX OF 03/26/2009   NECK MASS 02/20/2009   CHRONIC PAIN SYNDROME 07/11/2008   DYSPHAGIA 06/23/2008   DEPRESSION 06/02/2008   Pain in limb 05/06/2008   HYPOKALEMIA 04/22/2008   MYALGIA 04/07/2008   HIP PAIN, RIGHT 01/08/2008   VAGINITIS 08/24/2007   Urinary tract infection, site not  specified 07/11/2007   SPINAL STENOSIS, LUMBAR 06/13/2007   BACK PAIN, LUMBAR, WITH RADICULOPATHY 06/13/2007   Anemia, unspecified 05/02/2007   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   Dizziness and giddiness 12/20/2006   OTHER ACUTE SINUSITIS 12/06/2006   INSOMNIA 11/29/2006   GERD 10/27/2006   HIATAL HERNIA 10/27/2006   MOLE 09/07/2006   ANXIETY STATE NOS 08/10/2006   DIABETES MELLITUS, TYPE II 02/09/2006   HYPERLIPIDEMIA 02/09/2006   DEMENTIA 02/09/2006   HYPERTENSION 02/09/2006   CEREBROVASCULAR ACCIDENT 02/09/2006   INTERNAL HEMORRHOIDS 01/26/2006    Past Surgical History:  Procedure Laterality Date   ABDOMINAL HYSTERECTOMY  1982   APPENDECTOMY  1982   BREAST BIOPSY  1975   Lt breast, benign tumor   CATARACT EXTRACTION W/PHACO Left 03/12/2018   Procedure: CATARACT EXTRACTION PHACO AND INTRAOCULAR LENS PLACEMENT ([place]) LEFT  DIABETES;  Surgeon: Eulogio Bear, MD;  Location: [place];  Service: Ophthalmology;  Laterality: Left;  Diabetic   eptopic pregancy [place]    Prior to Admission medications   Medication Sig Start Date End Date Taking? Authorizing Provider  azithromycin (ZITHROMAX Z-PAK) 250 MG tablet Take 2 tablets (500 mg) on  Day 1,  followed by 1 tablet (250 mg) once daily on Days 2 through 5. 07/30/20  Yes Letitia Neri L, PA-C  acetaminophen (  TYLENOL) 325 MG tablet Take 650 mg by mouth every 4 (four) hours as needed.    [provider]  aspirin EC 81 MG tablet Take 81 mg by mouth daily.    [provider]  carboxymethylcellulose (REFRESH TEARS) 0.5 % SOLN 1 drop 2 (two) times daily as needed.    [provider]  Cholecalciferol (VITAMIN D3) 50000 units TABS Take by mouth every 30 (thirty) days.     [provider]  cyclobenzaprine (FLEXERIL) 5 MG tablet Take 5 mg by mouth 3 (three) times daily as needed for muscle spasms.    [provider]  erythromycin (ERY-TAB) 250 MG  EC tablet  10/10/17   [provider]  glimepiride (AMARYL) 4 MG tablet  09/15/17   [provider]  HYDROcodone-acetaminophen (NORCO/VICODIN) 5-325 MG tablet  10/09/17   [provider]  hydrOXYzine (ATARAX/VISTARIL) 10 MG tablet Take 10 mg by mouth every 8 (eight) hours as needed.    [provider]  JANUVIA 100 MG tablet  09/15/17   [provider]  LORazepam (ATIVAN) 0.5 MG tablet Take 0.5 mg by mouth 2 (two) times daily as needed. 1 Tab in the evening.  1 tab AM, PRN    [provider]  Melatonin 5 MG TABS Take by mouth at bedtime.    [provider]  Olmesartan-amLODIPine-HCTZ 40-5-12.5 MG TABS  09/13/17   [provider]  rosuvastatin (CRESTOR) 10 MG tablet  09/15/17   [provider]  senna-docusate (SENEXON-S) 8.6-50 MG tablet Take by mouth.    [provider]  sertraline (ZOLOFT) 100 MG tablet  09/15/17   [provider]    Allergies Ezetimibe-simvastatin  Family History  Problem Relation Age of Onset   Cervical cancer Mother    Stomach cancer Mother    Diabetes Sister        x3 sisters   Diabetes Brother        x1 brother   Hypertension Other    Breast cancer Sister        oldest sister   Prostate cancer Father    Prostate cancer Brother    Heart attack Other    Colon cancer Neg Hx     Social History Social History   Tobacco Use   Smoking status: Never   Smokeless tobacco: Never  Vaping Use   Vaping Use: Never used  Substance Use Topics   Alcohol use: No   Drug use: No    Review of Systems Constitutional: No fever/chills Eyes: No visual changes. ENT: No sore throat. Cardiovascular: Denies chest pain. Respiratory: Denies shortness of breath.  Negative for cough. Gastrointestinal: No abdominal pain.  No nausea, no vomiting.  No diarrhea.   Genitourinary: Negative for dysuria. Musculoskeletal: Positive chronic right-sided back pain. Skin: Negative for  rash. Neurological: Negative for headaches.  Focal weakness or numbness.  Reports generalized weakness.   ____________________________________________   PHYSICAL EXAM:  VITAL SIGNS: ED Triage Vitals  Enc Vitals Group     BP 07/30/20 1256 115/74     Pulse Rate 07/30/20 1256 75     Resp 07/30/20 1256 20     Temp 07/30/20 1256 98.8 F (37.1 C)     Temp Source 07/30/20 1256 Oral     SpO2 07/30/20 1256 94 %     Weight 07/30/20 1255 184 lb 1.4 oz (83.5 kg)     Height 07/30/20 1255 5\' 5"  (1.651 m)     Head Circumference --  Peak Flow --      Pain Score 07/30/20 1255 0     Pain Loc --      Pain Edu? --      Excl. in [place]? --     Constitutional: Alert and oriented. Well appearing and in no acute distress.  Patient is talkative, interactive and cooperative during the exam. Eyes: Conjunctivae are normal. PERRL. EOMI. Head: Atraumatic. Nose: No trauma. Mouth/Throat: No trauma noted. Neck: No stridor.  No cervical tenderness on palpation posteriorly. Cardiovascular: Normal rate, regular rhythm. Grossly normal heart sounds.  Good peripheral circulation. Respiratory: Normal respiratory effort.  No retractions. Lungs CTAB.  Minimal tenderness is noted on palpation of the right lateral ribs without soft tissue edema or skin discoloration.  No anterior chest wall pain. Gastrointestinal: Soft and nontender. No distention.  Bowel sounds normoactive x4 quadrants. Musculoskeletal: No point tenderness is noted on palpation of the upper extremities.  Patient is able to move without any difficulties.  Nontender thoracic or lumbar spine.  There is some tenderness on palpation of the right lateral hip but no tenderness is elicited with compression of the hips bilaterally.  No shortening of the lower extremity or internal rotation is noted.  Patient is able to bend lower extremities at the knees without any difficulty and with passive assistance.  Skin is intact and no discoloration is noted.  No edema  noted lower extremities. Neurologic:  Normal speech and language. No gross focal neurologic deficits are appreciated.  Skin:  Skin is warm, dry and intact.  Psychiatric: Mood and affect are normal. Speech and behavior are normal.  ____________________________________________   LABS (all labs ordered are listed, but only abnormal results are displayed)  Labs Reviewed  COMPREHENSIVE METABOLIC PANEL - Abnormal; Notable for the following components:      Result Value   Glucose, Bld 366 (*)    Creatinine, Ser 1.36 (*)    GFR, Estimated 39 (*)    All other components within normal limits  CBC WITH DIFFERENTIAL/PLATELET - Abnormal; Notable for the following components:   WBC 11.4 (*)    Neutro Abs 8.3 (*)    All other components within normal limits  URINALYSIS, COMPLETE (UACMP) WITH MICROSCOPIC - Abnormal; Notable for the following components:   Color, Urine YELLOW (*)    APPearance CLEAR (*)    Glucose, UA >=500 (*)    All other components within normal limits   ____________________________________________  EKG  Normal sinus rhythm with nonspecific T wave abnormality.  Ventricular rate was 70, PR interval 164, QRS duration 76 ____________________________________________  RADIOLOGY I, Johnn Hai, personally viewed and evaluated these images (plain radiographs) as part of my medical decision making, as well as reviewing the written report by the radiologist.    Official radiology report(s): DG Chest 2 View  Result Date: 07/30/2020 CLINICAL DATA:  Fall.  Weakness EXAM: CHEST - 2 VIEW COMPARISON:  08/21/2012 FINDINGS: Cardiac enlargement without heart failure. Mild bibasilar atelectasis/infiltrate. No pleural effusion. Mild atherosclerotic calcification aortic arch. Chronic healed fracture left humerus. IMPRESSION: Cardiac enlargement without heart failure. Mild bibasilar airspace disease consistent with atelectasis or pneumonia. Electronically Signed   By: Franchot Gallo M.D.    On: 07/30/2020 14:52   CT Head Wo Contrast  Result Date: 07/30/2020 CLINICAL DATA:  Unwitnessed fall EXAM: CT HEAD WITHOUT CONTRAST CT CERVICAL SPINE WITHOUT CONTRAST TECHNIQUE: Multidetector CT imaging of the head and cervical spine was performed following the standard protocol without intravenous contrast. Multiplanar CT image reconstructions  of the cervical spine were also generated. COMPARISON:  CT brain 02/13/2011, CT cervical spine 11/13/2006 FINDINGS: CT HEAD FINDINGS Brain: No acute territorial infarction, hemorrhage or intracranial mass. Patchy white matter hypodensity consistent with chronic small vessel ischemic change. Small chronic infarct in the left cerebellum. Stable ventricle size. Vascular: No hyperdense vessels.  Carotid vascular calcification Skull: Normal. Negative for fracture or focal lesion. Sinuses/Orbits: No acute finding. Other: None CT CERVICAL SPINE FINDINGS Alignment: Straightening of the cervical spine. No subluxation. Facet alignment maintained. Skull base and vertebrae: No acute fracture. No primary bone lesion or focal pathologic process. Soft tissues and spinal canal: No prevertebral fluid or swelling. No visible canal hematoma. Disc levels: Diffuse degenerative changes throughout the cervical spine. Bulky anterior osteophytes. Facet degenerative changes at multiple levels. Fusion of facets at C3-C4. Upper chest: Negative. Other: None IMPRESSION: 1. No CT evidence for acute intracranial abnormality. Atrophy and chronic small vessel ischemic changes of the white matter 2. Straightening of the cervical spine with degenerative change. No acute osseous abnormality. Electronically Signed   By: Donavan Foil M.D.   On: 07/30/2020 15:12   CT Cervical Spine Wo Contrast  Result Date: 07/30/2020 CLINICAL DATA:  Unwitnessed fall EXAM: CT HEAD WITHOUT CONTRAST CT CERVICAL SPINE WITHOUT CONTRAST TECHNIQUE: Multidetector CT imaging of the head and cervical spine was performed following  the standard protocol without intravenous contrast. Multiplanar CT image reconstructions of the cervical spine were also generated. COMPARISON:  CT brain 02/13/2011, CT cervical spine 11/13/2006 FINDINGS: CT HEAD FINDINGS Brain: No acute territorial infarction, hemorrhage or intracranial mass. Patchy white matter hypodensity consistent with chronic small vessel ischemic change. Small chronic infarct in the left cerebellum. Stable ventricle size. Vascular: No hyperdense vessels.  Carotid vascular calcification Skull: Normal. Negative for fracture or focal lesion. Sinuses/Orbits: No acute finding. Other: None CT CERVICAL SPINE FINDINGS Alignment: Straightening of the cervical spine. No subluxation. Facet alignment maintained. Skull base and vertebrae: No acute fracture. No primary bone lesion or focal pathologic process. Soft tissues and spinal canal: No prevertebral fluid or swelling. No visible canal hematoma. Disc levels: Diffuse degenerative changes throughout the cervical spine. Bulky anterior osteophytes. Facet degenerative changes at multiple levels. Fusion of facets at C3-C4. Upper chest: Negative. Other: None IMPRESSION: 1. No CT evidence for acute intracranial abnormality. Atrophy and chronic small vessel ischemic changes of the white matter 2. Straightening of the cervical spine with degenerative change. No acute osseous abnormality. Electronically Signed   By: Donavan Foil M.D.   On: 07/30/2020 15:12   DG Hip Unilat W or Wo Pelvis 2-3 Views Right  Result Date: 07/30/2020 CLINICAL DATA:  Weakness.  Fall EXAM: DG HIP (WITH OR WITHOUT PELVIS) 2-3V RIGHT COMPARISON:  11/19/2013 FINDINGS: There is no evidence of hip fracture or dislocation. There is no evidence of arthropathy or other focal bone abnormality. IMPRESSION: Negative. Electronically Signed   By: Franchot Gallo M.D.   On: 07/30/2020 14:56    ____________________________________________   PROCEDURES  Procedure(s) performed (including  Critical Care):  Procedures   ____________________________________________   INITIAL IMPRESSION / ASSESSMENT AND PLAN / ED COURSE  As part of my medical decision making, I reviewed the following data within the electronic MEDICAL RECORD NUMBER Notes from prior ED visits and [place] Controlled Substance Database  80 year old female is brought to the ED via EMS after an unwitnessed fall.  EMS reported that patient was noted between a dresser and a recliner.  Patient was talkative, interactive and cooperative during exam.  CT  of the head neck were negative and reassuring.  Patient reported that she felt weak at times and was worried about a urinary tract infection which she has had in the past.  Currently she has no symptoms of UTI and is afebrile.  Lab work showed a mildly elevated WBC and chest x-ray was suspicious for an early bibasilar pneumonia versus atelectasis.  Urinalysis was clear.  Remaining x-rays of the chest, right hip and pelvis were negative for acute fracture.  EKG noted as above.  Findings were discussed with daughter and patient and daughter agreeable to empirically be treated with Rocephin and this Zithromax in the event that this could be an early pneumonia.  It was also noted that her glucose was elevated and patient admits that she does not adhere to a diabetic diet but continues to take her medication as directed by her doctor.  She is to follow-up with her PCP if any continued problems.  She is aware that she will be more sore tomorrow than she is currently.  Prescription for the Zithromax was printed as the nursing facility states that they will take care of this prescription as they are switching pharmacies today. ____________________________________________   FINAL CLINICAL IMPRESSION(S) / ED DIAGNOSES  Final diagnoses:  Pneumonia of both lower lobes due to infectious organism  Multiple contusions  Unwitnessed fall     ED Discharge Orders          Ordered    azithromycin  (ZITHROMAX Z-PAK) 250 MG tablet        07/30/20 1606             Note:  This document was prepared using Dragon voice recognition software and may include unintentional dictation errors.    Johnn Hai, PA-C 07/30/20 1613    Naaman Plummer, MD 07/31/20 2329

## 2020-07-30 NOTE — ED Triage Notes (Signed)
Presents via EMS from [employer] s/p fall  per EMS she fell and was found between [employer] and recliner  denies nay pain  but states she doe shave chronic right sided back pain

## 2020-09-17 ENCOUNTER — Ambulatory Visit: Payer: Medicare Other | Admitting: Podiatry

## 2020-10-10 ENCOUNTER — Other Ambulatory Visit: Payer: Self-pay

## 2020-10-10 ENCOUNTER — Encounter: Payer: Self-pay | Admitting: Emergency Medicine

## 2020-10-10 ENCOUNTER — Emergency Department
Admission: EM | Admit: 2020-10-10 | Discharge: 2020-10-10 | Disposition: A | Discharge status: Home / Self Care | Payer: Medicare Other | Attending: Emergency Medicine | Admitting: Emergency Medicine

## 2020-10-10 DIAGNOSIS — Z79899 Other long term (current) drug therapy: Secondary | ICD-10-CM | POA: Insufficient documentation

## 2020-10-10 DIAGNOSIS — F0390 Unspecified dementia without behavioral disturbance: Secondary | ICD-10-CM | POA: Insufficient documentation

## 2020-10-10 DIAGNOSIS — N183 Chronic kidney disease, stage 3 unspecified: Secondary | ICD-10-CM | POA: Insufficient documentation

## 2020-10-10 DIAGNOSIS — E1122 Type 2 diabetes mellitus with diabetic chronic kidney disease: Secondary | ICD-10-CM | POA: Diagnosis not present

## 2020-10-10 DIAGNOSIS — I129 Hypertensive chronic kidney disease with stage 1 through stage 4 chronic kidney disease, or unspecified chronic kidney disease: Secondary | ICD-10-CM | POA: Diagnosis not present

## 2020-10-10 DIAGNOSIS — Z7982 Long term (current) use of aspirin: Secondary | ICD-10-CM | POA: Diagnosis not present

## 2020-10-10 DIAGNOSIS — Z7984 Long term (current) use of oral hypoglycemic drugs: Secondary | ICD-10-CM | POA: Insufficient documentation

## 2020-10-10 DIAGNOSIS — E876 Hypokalemia: Secondary | ICD-10-CM | POA: Insufficient documentation

## 2020-10-10 LAB — COMPREHENSIVE METABOLIC PANEL
ALT: 26 U/L (ref 0–44)
AST: 29 U/L (ref 15–41)
Albumin: 3.4 g/dL — ABNORMAL LOW (ref 3.5–5.0)
Alkaline Phosphatase: 61 U/L (ref 38–126)
Anion gap: 9 (ref 5–15)
BUN: 13 mg/dL (ref 8–23)
CO2: 30 mmol/L (ref 22–32)
Calcium: 9 mg/dL (ref 8.9–10.3)
Chloride: 101 mmol/L (ref 98–111)
Creatinine, Ser: 1.05 mg/dL — ABNORMAL HIGH (ref 0.44–1.00)
GFR, Estimated: 53 mL/min — ABNORMAL LOW (ref >60)
Glucose, Bld: 176 mg/dL — ABNORMAL HIGH (ref 70–99)
Potassium: 3.1 mmol/L — ABNORMAL LOW (ref 3.5–5.1)
Sodium: 140 mmol/L (ref 135–145)
Total Bilirubin: 0.7 mg/dL (ref 0.3–1.2)
Total Protein: 7.9 g/dL (ref 6.5–8.1)

## 2020-10-10 LAB — CBC WITH DIFFERENTIAL/PLATELET
Abs Immature Granulocytes: 0.02 10*3/uL (ref 0.00–0.07)
Basophils Absolute: 0.1 10*3/uL (ref 0.0–0.1)
Basophils Relative: 1 %
Eosinophils Absolute: 0.2 10*3/uL (ref 0.0–0.5)
Eosinophils Relative: 2 %
HCT: 40.8 % (ref 36.0–46.0)
Hemoglobin: 13.1 g/dL (ref 12.0–15.0)
Immature Granulocytes: 0 %
Lymphocytes Relative: 33 %
Lymphs Abs: 3.6 10*3/uL (ref 0.7–4.0)
MCH: 28.7 pg (ref 26.0–34.0)
MCHC: 32.1 g/dL (ref 30.0–36.0)
MCV: 89.3 fL (ref 80.0–100.0)
Monocytes Absolute: 0.7 10*3/uL (ref 0.1–1.0)
Monocytes Relative: 6 %
Neutro Abs: 6.5 10*3/uL (ref 1.7–7.7)
Neutrophils Relative %: 58 %
Platelets: 253 10*3/uL (ref 150–400)
RBC: 4.57 MIL/uL (ref 3.87–5.11)
RDW: 13.6 % (ref 11.5–15.5)
WBC: 11.1 10*3/uL — ABNORMAL HIGH (ref 4.0–10.5)
nRBC: 0 % (ref 0.0–0.2)

## 2020-10-10 MED ORDER — POTASSIUM CHLORIDE CRYS ER 20 MEQ PO TBCR
20.0000 meq | EXTENDED_RELEASE_TABLET | Freq: Once | ORAL | Status: AC
  Administered 2020-10-10: 20 meq via ORAL
  Filled 2020-10-10: qty 1

## 2020-10-10 NOTE — ED Notes (Signed)
This RN gave report to [place] at [facility].

## 2020-10-10 NOTE — ED Notes (Signed)
Pt waiting on transport resting playing games on tablet.

## 2020-10-10 NOTE — Discharge Instructions (Addendum)
Patient's lab values for potassium were shown to be 3.1 here in the emergency department.  This is not a critical value.  We gave her K-Lor 12 M EQ's prior to discharge.  Follow-up with her regular doctor for repeat labs next week

## 2020-10-10 NOTE — ED Provider Notes (Signed)
[facility] Emergency Department Provider Note  ____________________________________________   Event Date/Time   First MD Initiated Contact with Patient 10/10/20 1626     (approximate)  I have reviewed the triage vital signs and the nursing notes.   HISTORY  Chief Complaint abnormal labs (Patient here for abnormal labs per [facility], said critical labs)    HPI [name] is a 80 y.o. female presents emergency department via EMS from [place].  EMS was told patient had a critical lab value.  Paperwork shows a potassium of 2.46.  Patient states she does not feel bad and does not know why she is here.  Past Medical History:  Diagnosis Date   Allergy history unknown    Anxiety    Cerebrovascular accident [facility])    some residual left sided weakness   chronic back pain    CKD (chronic kidney disease), stage III ([place])    Depression    Diabetes mellitus    type 2   Gastroparesis    78% RETENTION AT 120 MIN   GERD (gastroesophageal reflux disease)    Hemorrhoids    Hiatal hernia    Hyperlipidemia    Hypertension    Spinal stenosis of lumbar region     Patient Active Problem List   Diagnosis Date Noted   Chest pain, non-cardiac 05/17/2010   Constipation 05/17/2010   POLYARTHRITIS 04/05/2010   POSTMENOPAUSAL STATUS 02/15/2010   TINEA CORPORIS 10/20/2009   VAGINAL DISCHARGE 08/14/2009   GASTROPARESIS 06/30/2009   WEAKNESS 06/08/2009   FALL, HX OF 03/26/2009   NECK MASS 02/20/2009   CHRONIC PAIN SYNDROME 07/11/2008   DYSPHAGIA 06/23/2008   DEPRESSION 06/02/2008   Pain in limb 05/06/2008   HYPOKALEMIA 04/22/2008   MYALGIA 04/07/2008   HIP PAIN, RIGHT 01/08/2008   VAGINITIS 08/24/2007   Urinary tract infection, site not specified 07/11/2007   SPINAL STENOSIS, LUMBAR 06/13/2007   BACK PAIN, LUMBAR, WITH RADICULOPATHY 06/13/2007   Anemia, unspecified 05/02/2007   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   Dizziness and giddiness  12/20/2006   OTHER ACUTE SINUSITIS 12/06/2006   INSOMNIA 11/29/2006   GERD 10/27/2006   HIATAL HERNIA 10/27/2006   MOLE 09/07/2006   ANXIETY STATE NOS 08/10/2006   DIABETES MELLITUS, TYPE II 02/09/2006   HYPERLIPIDEMIA 02/09/2006   DEMENTIA 02/09/2006   HYPERTENSION 02/09/2006   CEREBROVASCULAR ACCIDENT 02/09/2006   INTERNAL HEMORRHOIDS 01/26/2006    Past Surgical History:  Procedure Laterality Date   ABDOMINAL HYSTERECTOMY  1982   APPENDECTOMY  1982   BREAST BIOPSY  1975   Lt breast, benign tumor   CATARACT EXTRACTION W/PHACO Left 03/12/2018   Procedure: CATARACT EXTRACTION PHACO AND INTRAOCULAR LENS PLACEMENT ([place]) LEFT  DIABETES;  Surgeon: Eulogio Bear, MD;  Location: [place];  Service: Ophthalmology;  Laterality: Left;  Diabetic   eptopic pregancy [place]    Prior to Admission medications   Medication Sig Start Date End Date Taking? Authorizing Provider  acetaminophen (TYLENOL) 325 MG tablet Take 650 mg by mouth every 4 (four) hours as needed.    [provider]  aspirin EC 81 MG tablet Take 81 mg by mouth daily.    [provider]  azithromycin (ZITHROMAX Z-PAK) 250 MG tablet Take 2 tablets (500 mg) on  Day 1,  followed by 1 tablet (250 mg) once daily on Days 2 through 5. 07/30/20   Johnn Hai, PA-C  carboxymethylcellulose (REFRESH TEARS) 0.5 % SOLN 1 drop 2 (two) times daily as needed.    [provider]  Cholecalciferol (VITAMIN D3) 50000 units TABS Take by mouth every 30 (thirty) days.     [provider]  cyclobenzaprine (FLEXERIL) 5 MG tablet Take 5 mg by mouth 3 (three) times daily as needed for muscle spasms.    [provider]  erythromycin (ERY-TAB) 250 MG EC tablet  10/10/17   [provider]  glimepiride (AMARYL) 4 MG tablet  09/15/17   [provider]  HYDROcodone-acetaminophen (NORCO/VICODIN) 5-325 MG tablet  10/09/17   [provider]  hydrOXYzine (ATARAX/VISTARIL) 10 MG tablet Take 10 mg by mouth every 8 (eight) hours as needed.    [provider]  JANUVIA 100 MG tablet  09/15/17   [provider]  LORazepam (ATIVAN) 0.5 MG tablet Take 0.5 mg by mouth 2 (two) times daily as needed. 1 Tab in the evening.  1 tab AM, PRN    [provider]  Melatonin 5 MG TABS Take by mouth at bedtime.    [provider]  Olmesartan-amLODIPine-HCTZ 40-5-12.5 MG TABS  09/13/17   [provider]  rosuvastatin (CRESTOR) 10 MG tablet  09/15/17   [provider]  senna-docusate (SENEXON-S) 8.6-50 MG tablet Take by mouth.    [provider]  sertraline (ZOLOFT) 100 MG tablet  09/15/17   [provider]    Allergies Ezetimibe-simvastatin  Family History  Problem Relation Age of Onset   Cervical cancer Mother    Stomach cancer Mother    Diabetes Sister        x3 sisters   Diabetes Brother        x1 brother   Hypertension Other    Breast cancer Sister        oldest sister   Prostate cancer Father    Prostate cancer Brother    Heart attack Other    Colon cancer Neg Hx     Social History Social History   Tobacco Use   Smoking status: Never   Smokeless tobacco: Never  Vaping Use   Vaping Use: Never used  Substance Use Topics   Alcohol use: No   Drug use: No    Review of Systems  Constitutional: No fever/chills Eyes: No visual changes. ENT: No sore throat. Respiratory: Denies cough Cardiovascular: Denies chest pain Gastrointestinal: Denies abdominal pain Genitourinary: Negative for dysuria. Musculoskeletal: Negative for back pain. Skin: Negative for rash. Psychiatric: no mood changes,     ____________________________________________   PHYSICAL EXAM:  VITAL SIGNS: ED Triage Vitals  Enc Vitals Group     BP 10/10/20 1628 (!) 153/83     Pulse Rate 10/10/20 1628 70     Resp 10/10/20 1628 17     Temp 10/10/20 1628 98.7 F (37.1 C)     Temp  Source 10/10/20 1628 Oral     SpO2 10/10/20 1628 98 %     Weight 10/10/20 1631 179 lb 8 oz (81.4 kg)     Height 10/10/20 1631 '5\' 5"'$  (1.651 m)     Head Circumference --      Peak Flow --      Pain Score 10/10/20 1631 0     Pain Loc --      Pain Edu? --      Excl. in [place]? --     Constitutional: Alert and oriented. Well appearing and in no acute distress. Eyes: Conjunctivae are normal.  Head:  Atraumatic. Nose: No congestion/rhinnorhea. Mouth/Throat: Mucous membranes are moist.   Neck:  supple no lymphadenopathy noted Cardiovascular: Normal rate, regular rhythm. Heart sounds are normal Respiratory: Normal respiratory effort.  No retractions, lungs c t a  Abd: soft nontender bs normal all 4 quad GU: deferred Musculoskeletal: FROM all extremities, warm and well perfused Neurologic:  Normal speech and language.  Skin:  Skin is warm, dry and intact. No rash noted. Psychiatric: Mood and affect are normal. Speech and behavior are normal.  ____________________________________________   LABS (all labs ordered are listed, but only abnormal results are displayed)  Labs Reviewed  COMPREHENSIVE METABOLIC PANEL - Abnormal; Notable for the following components:      Result Value   Potassium 3.1 (*)    Glucose, Bld 176 (*)    Creatinine, Ser 1.05 (*)    Albumin 3.4 (*)    GFR, Estimated 53 (*)    All other components within normal limits  CBC WITH DIFFERENTIAL/PLATELET - Abnormal; Notable for the following components:   WBC 11.1 (*)    All other components within normal limits   ____________________________________________   ____________________________________________  RADIOLOGY    ____________________________________________   PROCEDURES  Procedure(s) performed: EKG shows sinus arrhythmia  Procedures    ____________________________________________   INITIAL IMPRESSION / ASSESSMENT AND PLAN / ED COURSE  Pertinent labs & imaging results that were available during my  care of the patient were reviewed by me and considered in my medical decision making (see chart for details).   Patient is an 80 year old female presents emergency department with abnormal labs via EMS.  See HPI.  Physical exam shows patient per stable  DDx: Hypokalemia, arrhythmia, metabolic acidosis, metabolic alkalosis, DKA  CBC is normal Metabolic panel shows a potassium of 3.1.  Glucose is 176  Ekg is similar to past ekgs  Patient been sent for critical value of her potassium.  3.1 is not critical.  We did give her 62 M EQ's of potassium prior to discharge.  They are to have her follow-up with her regular doctor as needed.  Return if worsening.  She was discharged to [place].     [name] was evaluated in Emergency Department on 10/10/2020 for the symptoms described in the history of present illness. She was evaluated in the context of the global COVID-19 pandemic, which necessitated consideration that the patient might be at risk for infection with the SARS-CoV-2 virus that causes COVID-19. Institutional protocols and algorithms that pertain to the evaluation of patients at risk for COVID-19 are in a state of rapid change based on information released by regulatory bodies including the CDC and federal and state organizations. These policies and algorithms were followed during the patient's care in the ED.    As part of my medical decision making, I reviewed the following data within the [facility] notes reviewed and incorporated, Labs reviewed , EKG interpreted sinus arrythmia, Old chart reviewed, Notes from prior ED visits, and [place] Controlled Substance Database  ____________________________________________   FINAL CLINICAL IMPRESSION(S) / ED DIAGNOSES  Final diagnoses:  Hypokalemia      NEW MEDICATIONS STARTED DURING THIS VISIT:  New Prescriptions   No medications on file     Note:  This document was prepared using Dragon voice  recognition software and may include unintentional dictation errors.    Versie Starks, PA-C 10/10/20 1711    Naaman Plummer, MD 10/10/20 [phone]

## 2020-10-10 NOTE — ED Triage Notes (Signed)
Patient with critical labs per [facility], EMS is not sure which labs are critical

## 2020-11-26 ENCOUNTER — Encounter: Payer: Self-pay | Admitting: Podiatry

## 2020-11-26 ENCOUNTER — Other Ambulatory Visit: Payer: Self-pay

## 2020-11-26 ENCOUNTER — Ambulatory Visit (INDEPENDENT_AMBULATORY_CARE_PROVIDER_SITE_OTHER): Payer: Medicare Other | Admitting: Podiatry

## 2020-11-26 DIAGNOSIS — E119 Type 2 diabetes mellitus without complications: Secondary | ICD-10-CM

## 2020-11-26 DIAGNOSIS — M79674 Pain in right toe(s): Secondary | ICD-10-CM

## 2020-11-26 DIAGNOSIS — B351 Tinea unguium: Secondary | ICD-10-CM | POA: Diagnosis not present

## 2020-11-26 DIAGNOSIS — M79675 Pain in left toe(s): Secondary | ICD-10-CM

## 2020-11-26 NOTE — Progress Notes (Signed)
This patient presents to my office for at risk foot care.  This patient requires this care by a professional since this patient will be at risk due to having diabetes and CKD  This patient is unable to cut nails herself since the patient cannot reach her nails.These nails are painful walking and wearing shoes.  This patient presents for at risk foot care today.  General Appearance  Alert, conversant and in no acute stress.  Vascular  Dorsalis pedis are palpable  bilaterally. Posterior tibial pulses are weakly palpable. Capillary return is within normal limits  bilaterally. Temperature is within normal limits  bilaterally.  Neurologic  Senn-Weinstein monofilament wire test within normal limits  bilaterally. Muscle power within normal limits bilaterally.  Nails Thick disfigured discolored nails with subungual debris  from second to fifth toes bilaterally. No evidence of bacterial infection or drainage bilaterally.  Orthopedic  No limitations of motion  feet .  No crepitus or effusions noted.  No bony pathology or digital deformities noted.  Skin  normotropic skin with no porokeratosis noted bilaterally.  No signs of infections or ulcers noted.     Onychomycosis  Pain in right toes  Pain in left toes  Consent was obtained for treatment procedures.   Mechanical debridement of nails 1-5  bilaterally performed with a nail nipper.  Filed with dremel without incident.    Return office visit      prn               Told patient to return for periodic foot care and evaluation due to potential at risk complications.   Gardiner Barefoot DPM

## 2021-05-01 ENCOUNTER — Emergency Department
Admission: EM | Admit: 2021-05-01 | Discharge: 2021-05-01 | Disposition: A | Discharge status: Skilled Nursing Facility | Payer: 59 | Attending: Emergency Medicine | Admitting: Emergency Medicine

## 2021-05-01 ENCOUNTER — Emergency Department: Payer: 59

## 2021-05-01 ENCOUNTER — Other Ambulatory Visit: Payer: Self-pay

## 2021-05-01 ENCOUNTER — Encounter: Payer: Self-pay | Admitting: Emergency Medicine

## 2021-05-01 DIAGNOSIS — R350 Frequency of micturition: Secondary | ICD-10-CM | POA: Insufficient documentation

## 2021-05-01 DIAGNOSIS — R531 Weakness: Secondary | ICD-10-CM | POA: Insufficient documentation

## 2021-05-01 DIAGNOSIS — R296 Repeated falls: Secondary | ICD-10-CM

## 2021-05-01 DIAGNOSIS — Z7982 Long term (current) use of aspirin: Secondary | ICD-10-CM | POA: Diagnosis not present

## 2021-05-01 DIAGNOSIS — I129 Hypertensive chronic kidney disease with stage 1 through stage 4 chronic kidney disease, or unspecified chronic kidney disease: Secondary | ICD-10-CM | POA: Insufficient documentation

## 2021-05-01 DIAGNOSIS — N183 Chronic kidney disease, stage 3 unspecified: Secondary | ICD-10-CM | POA: Insufficient documentation

## 2021-05-01 DIAGNOSIS — Z79899 Other long term (current) drug therapy: Secondary | ICD-10-CM | POA: Insufficient documentation

## 2021-05-01 DIAGNOSIS — S0990XA Unspecified injury of head, initial encounter: Secondary | ICD-10-CM | POA: Diagnosis present

## 2021-05-01 DIAGNOSIS — D72829 Elevated white blood cell count, unspecified: Secondary | ICD-10-CM | POA: Diagnosis not present

## 2021-05-01 DIAGNOSIS — N179 Acute kidney failure, unspecified: Secondary | ICD-10-CM | POA: Diagnosis not present

## 2021-05-01 DIAGNOSIS — E1122 Type 2 diabetes mellitus with diabetic chronic kidney disease: Secondary | ICD-10-CM | POA: Diagnosis not present

## 2021-05-01 DIAGNOSIS — W01198A Fall on same level from slipping, tripping and stumbling with subsequent striking against other object, initial encounter: Secondary | ICD-10-CM | POA: Insufficient documentation

## 2021-05-01 DIAGNOSIS — Z7984 Long term (current) use of oral hypoglycemic drugs: Secondary | ICD-10-CM | POA: Insufficient documentation

## 2021-05-01 LAB — URINALYSIS, ROUTINE W REFLEX MICROSCOPIC
Bacteria, UA: NONE SEEN
Bilirubin Urine: NEGATIVE
Glucose, UA: >=500 mg/dL — AB
Hgb urine dipstick: NEGATIVE
Ketones, ur: NEGATIVE mg/dL
Leukocytes,Ua: NEGATIVE
Nitrite: NEGATIVE
Protein, ur: NEGATIVE mg/dL
Specific Gravity, Urine: 1.027 (ref 1.005–1.030)
pH: 5 (ref 5.0–8.0)

## 2021-05-01 LAB — CBC
HCT: 40.1 % (ref 36.0–46.0)
Hemoglobin: 12.4 g/dL (ref 12.0–15.0)
MCH: 28.1 pg (ref 26.0–34.0)
MCHC: 30.9 g/dL (ref 30.0–36.0)
MCV: 90.7 fL (ref 80.0–100.0)
Platelets: 246 10*3/uL (ref 150–400)
RBC: 4.42 MIL/uL (ref 3.87–5.11)
RDW: 13.8 % (ref 11.5–15.5)
WBC: 17 10*3/uL — ABNORMAL HIGH (ref 4.0–10.5)
nRBC: 0 % (ref 0.0–0.2)

## 2021-05-01 LAB — BASIC METABOLIC PANEL
Anion gap: 15 (ref 5–15)
BUN: 19 mg/dL (ref 8–23)
CO2: 28 mmol/L (ref 22–32)
Calcium: 9.4 mg/dL (ref 8.9–10.3)
Chloride: 99 mmol/L (ref 98–111)
Creatinine, Ser: 1.51 mg/dL — ABNORMAL HIGH (ref 0.44–1.00)
GFR, Estimated: 35 mL/min — ABNORMAL LOW (ref >60)
Glucose, Bld: 246 mg/dL — ABNORMAL HIGH (ref 70–99)
Potassium: 3.4 mmol/L — ABNORMAL LOW (ref 3.5–5.1)
Sodium: 142 mmol/L (ref 135–145)

## 2021-05-01 MED ORDER — SODIUM CHLORIDE 0.9 % IV BOLUS (SEPSIS)
1000.0000 mL | Freq: Once | INTRAVENOUS | Status: AC
  Administered 2021-05-01: 1000 mL via INTRAVENOUS

## 2021-05-01 NOTE — ED Triage Notes (Signed)
Pt to ED via EMS from [facility] c/o multiple falls today, last one unwitnessed, also urinary frequency.  Pt denies injuries from falls but c/o head pain that was prior to falls.  EMS vitals 150/78 BP, 72 HR, 310 CBG, 98% RA.  Pt states hit her head tonight and neck pain but pain comes and goes.  Pt A&Ox4, chest rise even and unlabored, skin WNL, no obvious deformities, in NAD at this time. ?

## 2021-05-01 NOTE — ED Provider Notes (Signed)
? ?[facility] ?Provider Note ? ? ? Event Date/Time  ? First MD Initiated Contact with Patient 05/01/21 0330   ?  (approximate) ? ? ?History  ? ?Fall and Urinary Frequency ? ? ?HPI ? ?[name] is a 81 y.o. female with history of CVA, hypertension, diabetes, hyperlipidemia, CKD, previous lumbar laminectomy who presents to the emergency department EMS from [place] after she had a fall.  States that she fell twice today.  States that she has problems with her balance and weakness in her legs which is chronic.  States she hit her head both times but no loss of consciousness.  She is not on any anticoagulation but does take a baby aspirin daily.  She denies any injuries from her fall. ? ?Per triage note she is complaining of urinary frequency.  She denies any dysuria.  No chest pain, shortness of breath.  No numbness, tingling or weakness.  No bowel or bladder incontinence.  No urinary retention.  No abdominal pain, vomiting or diarrhea.  No cough. ? ? ?History provided by patient and EMS. ? ? ? ?Past Medical History:  ?Diagnosis Date  ? Allergy history unknown   ? Anxiety   ? Cerebrovascular accident [facility])   ? some residual left sided weakness  ? chronic back pain   ? CKD (chronic kidney disease), stage III ([place])   ? Depression   ? Diabetes mellitus   ? type 2  ? Gastroparesis   ? 78% RETENTION AT 120 MIN  ? GERD (gastroesophageal reflux disease)   ? Hemorrhoids   ? Hiatal hernia   ? Hyperlipidemia   ? Hypertension   ? Spinal stenosis of lumbar region   ? ? ?Past Surgical History:  ?Procedure Laterality Date  ? ABDOMINAL HYSTERECTOMY  1982  ? APPENDECTOMY  1982  ? BREAST BIOPSY  1975  ? Lt breast, benign tumor  ? CATARACT EXTRACTION W/PHACO Left 03/12/2018  ? Procedure: CATARACT EXTRACTION PHACO AND INTRAOCULAR LENS PLACEMENT ([place]) LEFT  DIABETES;  Surgeon: Eulogio Bear, MD;  Location: [place];  Service: Ophthalmology;  Laterality: Left;  Diabetic  ? eptopic  pregancy 1982    ? TONSILLECTOMY    ? TUBAL LIGATION  1975  ? ? ?MEDICATIONS:  ?Prior to Admission medications   ?Medication Sig Start Date End Date Taking? Authorizing Provider  ?acetaminophen (TYLENOL) 325 MG tablet Take 650 mg by mouth every 4 (four) hours as needed.    [provider]  ?aspirin EC 81 MG tablet Take 81 mg by mouth daily.    [provider]  ?azithromycin (ZITHROMAX Z-PAK) 250 MG tablet Take 2 tablets (500 mg) on  Day 1,  followed by 1 tablet (250 mg) once daily on Days 2 through 5. 07/30/20   Johnn Hai, PA-C  ?carboxymethylcellulose (REFRESH TEARS) 0.5 % SOLN 1 drop 2 (two) times daily as needed.    [provider]  ?Cholecalciferol (VITAMIN D3) 50000 units TABS Take by mouth every 30 (thirty) days.     [provider]  ?cyclobenzaprine (FLEXERIL) 5 MG tablet Take 5 mg by mouth 3 (three) times daily as needed for muscle spasms.    [provider]  ?erythromycin (ERY-TAB) 250 MG EC tablet  10/10/17   [provider]  ?glimepiride (AMARYL) 4 MG tablet  09/15/17   [provider]  ?HYDROcodone-acetaminophen (NORCO/VICODIN) 5-325 MG tablet  10/09/17   [provider]  ?hydrOXYzine (ATARAX/VISTARIL) 10 MG tablet Take 10 mg  by mouth every 8 (eight) hours as needed.    [provider]  ?JANUVIA 100 MG tablet  09/15/17   [provider]  ?LORazepam (ATIVAN) 0.5 MG tablet Take 0.5 mg by mouth 2 (two) times daily as needed. 1 Tab in the evening.  1 tab AM, PRN    [provider]  ?Melatonin 5 MG TABS Take by mouth at bedtime.    [provider]  ?Olmesartan-amLODIPine-HCTZ 40-5-12.5 MG TABS  09/13/17   [provider]  ?rosuvastatin (CRESTOR) 10 MG tablet  09/15/17   [provider]  ?senna-docusate (SENEXON-S) 8.6-50 MG tablet Take by mouth.    [provider]  ?sertraline (ZOLOFT) 100 MG tablet  09/15/17   [provider]  ? ? ?Physical Exam  ? ?Triage Vital  Signs: ?ED Triage Vitals  ?Enc Vitals Group  ?   BP 05/01/21 0218 (!) 153/78  ?   Pulse Rate 05/01/21 0218 67  ?   Resp 05/01/21 0218 16  ?   Temp 05/01/21 0218 99 ?F (37.2 ?C)  ?   Temp Source 05/01/21 0218 Oral  ?   SpO2 05/01/21 0218 90 %  ?   Weight 05/01/21 0211 166 lb (75.3 kg)  ?   Height 05/01/21 0211 '5\' 5"'$  (1.651 m)  ?   Head Circumference --   ?   Peak Flow --   ?   Pain Score 05/01/21 0211 0  ?   Pain Loc --   ?   Pain Edu? --   ?   Excl. in [place]? --   ? ? ?Most recent vital signs: ?Vitals:  ? 05/01/21 0410 05/01/21 0630  ?BP:  140/80  ?Pulse:  72  ?Resp:  17  ?Temp: 99.2 ?F (37.3 ?C)   ?SpO2:  96%  ? ? ? ?CONSTITUTIONAL: Alert and oriented and responds appropriately to questions. Well-appearing; well-nourished; GCS 56, elderly ?HEAD: Normocephalic; atraumatic ?EYES: Conjunctivae clear, PERRL, EOMI ?ENT: normal nose; no rhinorrhea; moist mucous membranes; pharynx without lesions noted; no dental injury; no septal hematoma, no epistaxis; no facial deformity or bony tenderness ?NECK: Supple, no midline spinal tenderness, step-off or deformity; trachea midline ?CARD: RRR; S1 and S2 appreciated; no murmurs, no clicks, no rubs, no gallops ?RESP: Normal chest excursion without splinting or tachypnea; breath sounds clear and equal bilaterally; no wheezes, no rhonchi, no rales; no hypoxia or respiratory distress ?CHEST:  chest wall stable, no crepitus or ecchymosis or deformity, nontender to palpation; no flail chest ?ABD/GI: Normal bowel sounds; non-distended; soft, non-tender, no rebound, no guarding; no ecchymosis or other lesions noted ?PELVIS:  stable, nontender to palpation ?BACK:  The back appears normal; no midline spinal tenderness, step-off or deformity ?EXT: Normal ROM in all joints; non-tender to palpation; no edema; normal capillary refill; no cyanosis, no bony tenderness or bony deformity of patient's extremities, no joint effusion, compartments are soft, extremities are warm and well-perfused, no  ecchymosis ?SKIN: Normal color for age and race; warm ?NEURO: No facial asymmetry, normal speech, moving all extremities equally, normal sensation diffusely, able to lift both of her legs up off the bed and hold them against gravity for several seconds without any difficulty, no saddle anesthesia, no saddle anesthesia, no hyperreflexia or clonus ? ?ED Results / Procedures / Treatments  ? ?LABS: ?(all labs ordered are listed, but only abnormal results are displayed) ?Labs Reviewed  ?BASIC METABOLIC PANEL - Abnormal; Notable for the following components:  ?    Result Value  ?  Potassium 3.4 (*)   ? Glucose, Bld 246 (*)   ? Creatinine, Ser 1.51 (*)   ? GFR, Estimated 35 (*)   ? All other components within normal limits  ?CBC - Abnormal; Notable for the following components:  ? WBC 17.0 (*)   ? All other components within normal limits  ?URINALYSIS, ROUTINE W REFLEX MICROSCOPIC - Abnormal; Notable for the following components:  ? Color, Urine YELLOW (*)   ? APPearance CLEAR (*)   ? Glucose, UA >=500 (*)   ? All other components within normal limits  ? ? ? ?EKG: ? EKG Interpretation ? ?Date/Time:  Saturday May 01 2021 32:20:25 EDT ?Ventricular Rate:  66 ?PR Interval:  148 ?QRS Duration: 86 ?QT Interval:  410 ?QTC Calculation: 429 ?R Axis:   23 ?Text Interpretation: Normal sinus rhythm with sinus arrhythmia Nonspecific T wave abnormality Abnormal ECG When compared with ECG of 10-Oct-2020 16:43, PREVIOUS ECG IS PRESENT Confirmed by Pryor Curia [phone]) on 05/01/2021 4:14:11 AM ?  ? ?  ? ? ? ? ?RADIOLOGY: ?My personal review and interpretation of imaging: CT head and cervical spine show no acute traumatic injury.  Chest x-ray clear. ? ?I have personally reviewed all radiology reports. ?CT HEAD WO CONTRAST ? ?Result Date: 05/01/2021 ?CLINICAL DATA:  Unwitnessed fall, striking head. Head pain. Multiple falls today. EXAM: CT HEAD WITHOUT CONTRAST TECHNIQUE: Contiguous axial images were obtained from the base of the skull through  the vertex without intravenous contrast. RADIATION DOSE REDUCTION: This exam was performed according to the departmental dose-optimization program which includes automated exposure control, adjustment of the

## 2021-05-01 NOTE — ED Notes (Signed)
Report received from [place], Therapist, sports. Pt waiting for EMS transportation back to [facility].  ?

## 2021-05-01 NOTE — Discharge Instructions (Addendum)
CT head and cervical spine showed no traumatic injury. ? ?Your white blood cell count today was elevated to 17,000.  Your rectal temperature was normal and your urine and chest x-ray showed no sign of infection and you are not having any infectious symptoms.  I recommend that this be rechecked by your primary care doctor in 1 to 2 weeks. ? ?Your kidney function was also slightly elevated today with a creatinine of 1.51.  We have given you IV fluids.  Recommend that you increase your fluid intake and follow-up with your primary care doctor in 1 to 2 weeks to have this rechecked as well. ?

## 2021-08-23 ENCOUNTER — Ambulatory Visit: Payer: 59 | Admitting: Dermatology

## 2021-12-09 ENCOUNTER — Emergency Department: Payer: 59

## 2021-12-09 ENCOUNTER — Emergency Department
Admission: EM | Admit: 2021-12-09 | Discharge: 2021-12-09 | Disposition: A | Discharge status: Home / Self Care | Payer: 59 | Attending: Emergency Medicine | Admitting: Emergency Medicine

## 2021-12-09 ENCOUNTER — Other Ambulatory Visit: Payer: Self-pay

## 2021-12-09 ENCOUNTER — Encounter: Payer: Self-pay | Admitting: Emergency Medicine

## 2021-12-09 DIAGNOSIS — D72829 Elevated white blood cell count, unspecified: Secondary | ICD-10-CM | POA: Insufficient documentation

## 2021-12-09 DIAGNOSIS — M25571 Pain in right ankle and joints of right foot: Secondary | ICD-10-CM | POA: Insufficient documentation

## 2021-12-09 DIAGNOSIS — S82841A Displaced bimalleolar fracture of right lower leg, initial encounter for closed fracture: Secondary | ICD-10-CM | POA: Diagnosis not present

## 2021-12-09 DIAGNOSIS — F0390 Unspecified dementia without behavioral disturbance: Secondary | ICD-10-CM | POA: Diagnosis not present

## 2021-12-09 DIAGNOSIS — E119 Type 2 diabetes mellitus without complications: Secondary | ICD-10-CM | POA: Insufficient documentation

## 2021-12-09 DIAGNOSIS — I10 Essential (primary) hypertension: Secondary | ICD-10-CM | POA: Diagnosis not present

## 2021-12-09 DIAGNOSIS — W01198A Fall on same level from slipping, tripping and stumbling with subsequent striking against other object, initial encounter: Secondary | ICD-10-CM | POA: Diagnosis not present

## 2021-12-09 DIAGNOSIS — S8991XA Unspecified injury of right lower leg, initial encounter: Secondary | ICD-10-CM | POA: Diagnosis present

## 2021-12-09 DIAGNOSIS — S0990XA Unspecified injury of head, initial encounter: Secondary | ICD-10-CM | POA: Insufficient documentation

## 2021-12-09 DIAGNOSIS — W19XXXA Unspecified fall, initial encounter: Secondary | ICD-10-CM

## 2021-12-09 LAB — CBC WITH DIFFERENTIAL/PLATELET
Abs Immature Granulocytes: 0.1 10*3/uL — ABNORMAL HIGH (ref 0.00–0.07)
Basophils Absolute: 0 10*3/uL (ref 0.0–0.1)
Basophils Relative: 0 %
Eosinophils Absolute: 0 10*3/uL (ref 0.0–0.5)
Eosinophils Relative: 0 %
HCT: 35.5 % — ABNORMAL LOW (ref 36.0–46.0)
Hemoglobin: 11.2 g/dL — ABNORMAL LOW (ref 12.0–15.0)
Immature Granulocytes: 1 %
Lymphocytes Relative: 8 %
Lymphs Abs: 1.5 10*3/uL (ref 0.7–4.0)
MCH: 28.1 pg (ref 26.0–34.0)
MCHC: 31.5 g/dL (ref 30.0–36.0)
MCV: 89.2 fL (ref 80.0–100.0)
Monocytes Absolute: 0.9 10*3/uL (ref 0.1–1.0)
Monocytes Relative: 5 %
Neutro Abs: 15 10*3/uL — ABNORMAL HIGH (ref 1.7–7.7)
Neutrophils Relative %: 86 %
Platelets: 190 10*3/uL (ref 150–400)
RBC: 3.98 MIL/uL (ref 3.87–5.11)
RDW: 14.4 % (ref 11.5–15.5)
WBC: 17.6 10*3/uL — ABNORMAL HIGH (ref 4.0–10.5)
nRBC: 0 % (ref 0.0–0.2)

## 2021-12-09 LAB — URINALYSIS, ROUTINE W REFLEX MICROSCOPIC
Bilirubin Urine: NEGATIVE
Glucose, UA: >=500 mg/dL — AB
Hgb urine dipstick: NEGATIVE
Ketones, ur: 5 mg/dL — AB
Leukocytes,Ua: NEGATIVE
Nitrite: NEGATIVE
Protein, ur: 30 mg/dL — AB
Specific Gravity, Urine: 1.027 (ref 1.005–1.030)
WBC, UA: NONE SEEN WBC/hpf (ref 0–5)
pH: 5 (ref 5.0–8.0)

## 2021-12-09 LAB — COMPREHENSIVE METABOLIC PANEL
ALT: 36 U/L (ref 0–44)
AST: 33 U/L (ref 15–41)
Albumin: 3.7 g/dL (ref 3.5–5.0)
Alkaline Phosphatase: 56 U/L (ref 38–126)
Anion gap: 9 (ref 5–15)
BUN: 17 mg/dL (ref 8–23)
CO2: 24 mmol/L (ref 22–32)
Calcium: 9.2 mg/dL (ref 8.9–10.3)
Chloride: 107 mmol/L (ref 98–111)
Creatinine, Ser: 1.16 mg/dL — ABNORMAL HIGH (ref 0.44–1.00)
GFR, Estimated: 47 mL/min — ABNORMAL LOW (ref >60)
Glucose, Bld: 270 mg/dL — ABNORMAL HIGH (ref 70–99)
Potassium: 3.4 mmol/L — ABNORMAL LOW (ref 3.5–5.1)
Sodium: 140 mmol/L (ref 135–145)
Total Bilirubin: 1.3 mg/dL — ABNORMAL HIGH (ref 0.3–1.2)
Total Protein: 7.8 g/dL (ref 6.5–8.1)

## 2021-12-09 LAB — CK: Total CK: 223 U/L (ref 38–234)

## 2021-12-09 MED ORDER — ACETAMINOPHEN 325 MG PO TABS
650.0000 mg | ORAL_TABLET | Freq: Once | ORAL | Status: AC
  Administered 2021-12-09: 650 mg via ORAL
  Filled 2021-12-09: qty 2

## 2021-12-09 NOTE — Discharge Instructions (Signed)
Please remain nonweightbearing and keep your splint clean and dry.  Please keep your leg elevated.  Please arrange an appointment for next week with Dr. Posey Pronto with podiatry for further management of your ankle fracture.  He is expecting your call.  Please return for any new, worsening, or change in symptoms or other concerns.  It was a pleasure caring for you today.

## 2021-12-09 NOTE — ED Provider Notes (Signed)
[facility] Provider Note    None    (approximate)   History   Fall   HPI  [name] is a 81 y.o. female with a past medical history of diabetes, spinal stenosis, dementia, CVA, hypertension who presents today for evaluation after a fall.  Patient reports that she normally gets up several times during the night to pee, but last night she tripped over a cord and twisted her ankle.  She reports that she struck her head but did not lose consciousness.  She was unable to get herself up and stayed on the ground for approximately 5+ hours.  Patient reports that she has a headache.  She reports that she takes a daily aspirin.  No paresthesias or weakness.  Patient Active Problem List   Diagnosis Date Noted   Pain due to onychomycosis of toenails of both feet 11/26/2020   Diabetes mellitus without complication ([place]) 32/95/1884   Chest pain, non-cardiac 05/17/2010   Constipation 05/17/2010   POLYARTHRITIS 04/05/2010   POSTMENOPAUSAL STATUS 02/15/2010   TINEA CORPORIS 10/20/2009   VAGINAL DISCHARGE 08/14/2009   GASTROPARESIS 06/30/2009   WEAKNESS 06/08/2009   FALL, HX OF 03/26/2009   NECK MASS 02/20/2009   CHRONIC PAIN SYNDROME 07/11/2008   DYSPHAGIA 06/23/2008   DEPRESSION 06/02/2008   Pain in limb 05/06/2008   HYPOKALEMIA 04/22/2008   MYALGIA 04/07/2008   HIP PAIN, RIGHT 01/08/2008   VAGINITIS 08/24/2007   Urinary tract infection, site not specified 07/11/2007   SPINAL STENOSIS, LUMBAR 06/13/2007   BACK PAIN, LUMBAR, WITH RADICULOPATHY 06/13/2007   Anemia, unspecified 05/02/2007   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   Dizziness and giddiness 12/20/2006   OTHER ACUTE SINUSITIS 12/06/2006   INSOMNIA 11/29/2006   GERD 10/27/2006   HIATAL HERNIA 10/27/2006   MOLE 09/07/2006   ANXIETY STATE NOS 08/10/2006   DIABETES MELLITUS, TYPE II 02/09/2006   HYPERLIPIDEMIA 02/09/2006   DEMENTIA 02/09/2006   HYPERTENSION 02/09/2006   CEREBROVASCULAR ACCIDENT  02/09/2006   INTERNAL HEMORRHOIDS 01/26/2006          Physical Exam   Triage Vital Signs: ED Triage Vitals  Enc Vitals Group     BP 12/09/21 0638 (!) 148/81     Pulse Rate 12/09/21 0638 81     Resp 12/09/21 0638 18     Temp 12/09/21 0638 98.7 F (37.1 C)     Temp Source 12/09/21 0638 Oral     SpO2 12/09/21 0637 95 %     Weight 12/09/21 0640 166 lb (75.3 kg)     Height 12/09/21 0640 '5\' 4"'$  (1.626 m)     Head Circumference --      Peak Flow --      Pain Score 12/09/21 0640 10     Pain Loc --      Pain Edu? --      Excl. in [place]? --     Most recent vital signs: Vitals:   12/09/21 1016 12/09/21 1302  BP: (!) 140/70 138/70  Pulse: 78 80  Resp: 18 18  Temp: 98 F (36.7 C)   SpO2: 98% 98%    Physical Exam Vitals and nursing note reviewed.  Constitutional:      General: Awake and alert. No acute distress.    Appearance: Normal appearance. The patient is normal weight.  HENT:     Head: Normocephalic and atraumatic.  No scalp laceration or hematoma noted.  No battle sign or raccoon eyes    Mouth: Mucous membranes are  moist.  Eyes:     General: PERRL. Normal EOMs        Right eye: No discharge.        Left eye: No discharge.     Conjunctiva/sclera: Conjunctivae normal.  Cardiovascular:     Rate and Rhythm: Normal rate and regular rhythm.     Pulses: Normal pulses.     Heart sounds: Normal heart sounds Pulmonary:     Effort: Pulmonary effort is normal. No respiratory distress.     Breath sounds: Normal breath sounds.  Abdominal:     Abdomen is soft. There is no abdominal tenderness. No rebound or guarding. No distention. Musculoskeletal:        General: No swelling. Normal range of motion.     Cervical back: Normal range of motion and neck supple.  No midline cervical spine tenderness.  Full range of motion of neck.  Negative Spurling test.  Negative Lhermitte sign.  Normal strength and sensation in bilateral upper extremities. Normal grip strength bilaterally.   Normal intrinsic muscle function of the hand bilaterally.  Normal radial pulses bilaterally. Right ankle: Tenderness and swelling with ecchymosis over the lateral malleolus, no medial malleolar tenderness or proximal fifth metacarpal tenderness. No proximal fibular tenderness. 2+ pedal pulses with brisk capillary refill. Intact distal sensation and strength with normal ROM. Able to plantar flex and dorsiflex against resistance. Able to invert and evert against resistance. Negative Thompson test.  Negative logroll at the hip bilaterally.  Able to actively lift both legs up off of the stretcher Skin:    General: Skin is warm and dry.     Capillary Refill: Capillary refill takes less than 2 seconds.     Findings: No rash.  Neurological:     Mental Status: The patient is awake and alert.      ED Results / Procedures / Treatments   Labs (all labs ordered are listed, but only abnormal results are displayed) Labs Reviewed  CBC WITH DIFFERENTIAL/PLATELET - Abnormal; Notable for the following components:      Result Value   WBC 17.6 (*)    Hemoglobin 11.2 (*)    HCT 35.5 (*)    Neutro Abs 15.0 (*)    Abs Immature Granulocytes 0.10 (*)    All other components within normal limits  COMPREHENSIVE METABOLIC PANEL - Abnormal; Notable for the following components:   Potassium 3.4 (*)    Glucose, Bld 270 (*)    Creatinine, Ser 1.16 (*)    Total Bilirubin 1.3 (*)    GFR, Estimated 47 (*)    All other components within normal limits  URINALYSIS, ROUTINE W REFLEX MICROSCOPIC - Abnormal; Notable for the following components:   Color, Urine AMBER (*)    APPearance CLOUDY (*)    Glucose, UA >=500 (*)    Ketones, ur 5 (*)    Protein, ur 30 (*)    Bacteria, UA RARE (*)    All other components within normal limits  CK     EKG     RADIOLOGY I independently reviewed and interpreted imaging and agree with radiologists findings.     PROCEDURES:  Critical Care performed:   .Splint  Application  Date/Time: 12/09/2021 9:39 AM  Performed by: Marquette Old, PA-C Authorized by: Marquette Old, PA-C   Consent:    Consent obtained:  Verbal   Consent given by:  Patient   Risks, benefits, and alternatives were discussed: yes     Risks discussed:  Discoloration, numbness,  pain and swelling   Alternatives discussed:  No treatment, delayed treatment and referral Universal protocol:    Procedure explained and questions answered to patient or proxy's satisfaction: yes     Relevant documents present and verified: yes     Test results available: yes     Imaging studies available: yes     Required blood products, implants, devices, and special equipment available: yes     Site/side marked: yes     Immediately prior to procedure a time out was called: yes     Patient identity confirmed:  Verbally with patient Pre-procedure details:    Distal neurologic exam:  Normal   Distal perfusion: distal pulses strong and brisk capillary refill   Procedure details:    Location:  Ankle   Ankle location:  R ankle   Cast type:  Short leg   Splint type:  Ankle stirrup (and posterior)   Supplies:  Cotton padding and elastic bandage (orthoglass) Post-procedure details:    Distal neurologic exam:  Normal   Distal perfusion: distal pulses strong     Procedure completion:  Tolerated well, no immediate complications   Post-procedure imaging: not applicable      MEDICATIONS ORDERED IN ED: Medications  acetaminophen (TYLENOL) tablet 650 mg (650 mg Oral Given 12/09/21 3299)     IMPRESSION / MDM / ASSESSMENT AND PLAN / ED COURSE  I reviewed the triage vital signs and the nursing notes.   Differential diagnosis includes, but is not limited to, fracture, dislocation, sprain, rhabdomyolysis, electrolyte disarray, urinary tract infection.  Patient is awake and alert, hemodynamically stable and afebrile.  She is neurovascularly intact.  She is got ecchymosis and swelling to her lateral right  ankle.  X-rays do reveal what appears to be a Weber C distal fibula fracture with a possible avulsion off the medial malleolus.  Her compartments are soft and compressible throughout, normal distal pulses, foot is warm well perfused, no pain out of proportion, do not suspect compartment syndrome or Bosworth fracture. I discussed originally with Dr. Leim Fabry with orthopedics who recommended that I speak with podiatry.  I spoke with Dr. Posey Pronto with podiatry who did not feel that this needed to be reduced nor urgently fixed.  He recommends a stirrup and posterior splint and nonweightbearing status and patient will be able to follow-up in clinic for likely operative repair.  Patient is in agreement with this plan.  CT head and neck obtained per [place] criteria were normal.  I did also order blood work given that she was laying on the ground to ensure that she has not developed a rhabdomyolysis.  CPK is within normal limits.  She does have a leukocytosis, though no clinical signs or symptoms of infection, likely due to her trauma.  We discussed return precautions and the importance of close outpatient follow-up.  Patient understands and agrees with plan.  She was discharged in stable condition.   Patient's presentation is most consistent with acute complicated illness / injury requiring diagnostic workup.   Clinical Course as of 12/09/21 1326  Thu Dec 09, 2021  0851 Orthopedics paged [JP]  0901 Per Dr. Leim Fabry, should be discussed with podiatry.  However he did review the films and does not think that this requires urgent repair.  I spoke with Dr. Posey Pronto with podiatry who also reviewed the films, and feels that this does not require reduction, can splint with posterior and stirrup splint and will require outpatient operative repair [JP]  Clinical Course User Index [JP] Janard Culp, Clarnce Flock, PA-C     FINAL CLINICAL IMPRESSION(S) / ED DIAGNOSES   Final diagnoses:  Fall, initial encounter  Injury  of head, initial encounter  Closed bimalleolar fracture of right ankle, initial encounter     Rx / DC Orders   ED Discharge Orders     None        Note:  This document was prepared using Dragon voice recognition software and may include unintentional dictation errors.   Marquette Old, PA-C 12/09/21 1326    Harvest Dark, MD 12/09/21 1355

## 2021-12-09 NOTE — ED Triage Notes (Signed)
EMS brings pt to triage from [facility]; reports fall last night getting up to BR; c/o rt ankle pain/swelling with bruising; denies any other injuries or c/o

## 2021-12-21 ENCOUNTER — Ambulatory Visit (INDEPENDENT_AMBULATORY_CARE_PROVIDER_SITE_OTHER): Payer: 59 | Admitting: Podiatry

## 2021-12-21 DIAGNOSIS — S8264XA Nondisplaced fracture of lateral malleolus of right fibula, initial encounter for closed fracture: Secondary | ICD-10-CM | POA: Diagnosis not present

## 2021-12-21 DIAGNOSIS — E119 Type 2 diabetes mellitus without complications: Secondary | ICD-10-CM | POA: Diagnosis not present

## 2021-12-21 NOTE — Progress Notes (Signed)
Subjective:  Patient ID: [name], female    DOB: 11-13-1940,  MRN: [id number]  Chief Complaint  Patient presents with   Fracture    81 y.o. female presents with the above complaint.  Patient presents with right ankle fracture.  She states she was caught on an iPad wire and tripped and fell onto some of her foot.  She went to the emergency room where she had a fibular fracture and a possible medial clear space gapping.  She wanted get it evaluated would like to discuss option for surgery she is 81 year old she is diabetic with last A1c of 6.5.  She denies any other acute complaints pain.  She has been nonweightbearing with a walker.   Review of Systems: Negative except as noted in the HPI. Denies N/V/F/Ch.  Past Medical History:  Diagnosis Date   Allergy history unknown    Anxiety    Cerebrovascular accident ([place])    some residual left sided weakness   chronic back pain    CKD (chronic kidney disease), stage III ([place])    Depression    Diabetes mellitus    type 2   Gastroparesis    78% RETENTION AT 120 MIN   GERD (gastroesophageal reflux disease)    Hemorrhoids    Hiatal hernia    Hyperlipidemia    Hypertension    Spinal stenosis of lumbar region     Current Outpatient Medications:    acetaminophen (TYLENOL) 325 MG tablet, Take 650 mg by mouth every 4 (four) hours as needed., Disp: , Rfl:    aspirin EC 81 MG tablet, Take 81 mg by mouth daily., Disp: , Rfl:    azithromycin (ZITHROMAX Z-PAK) 250 MG tablet, Take 2 tablets (500 mg) on  Day 1,  followed by 1 tablet (250 mg) once daily on Days 2 through 5., Disp: 6 each, Rfl: 0   carboxymethylcellulose (REFRESH TEARS) 0.5 % SOLN, 1 drop 2 (two) times daily as needed., Disp: , Rfl:    Cholecalciferol (VITAMIN D3) 50000 units TABS, Take by mouth every 30 (thirty) days. , Disp: , Rfl:    cyclobenzaprine (FLEXERIL) 5 MG tablet, Take 5 mg by mouth 3 (three) times daily as needed for muscle spasms., Disp: , Rfl:    erythromycin  (ERY-TAB) 250 MG EC tablet, , Disp: , Rfl:    glimepiride (AMARYL) 4 MG tablet, , Disp: , Rfl:    HYDROcodone-acetaminophen (NORCO/VICODIN) 5-325 MG tablet, , Disp: , Rfl:    hydrOXYzine (ATARAX/VISTARIL) 10 MG tablet, Take 10 mg by mouth every 8 (eight) hours as needed., Disp: , Rfl:    JANUVIA 100 MG tablet, , Disp: , Rfl:    LORazepam (ATIVAN) 0.5 MG tablet, Take 0.5 mg by mouth 2 (two) times daily as needed. 1 Tab in the evening.  1 tab AM, PRN, Disp: , Rfl:    Melatonin 5 MG TABS, Take by mouth at bedtime., Disp: , Rfl:    Olmesartan-amLODIPine-HCTZ 40-5-12.5 MG TABS, , Disp: , Rfl:    rosuvastatin (CRESTOR) 10 MG tablet, , Disp: , Rfl:    senna-docusate (SENEXON-S) 8.6-50 MG tablet, Take by mouth., Disp: , Rfl:    sertraline (ZOLOFT) 100 MG tablet, , Disp: , Rfl:   Social History   Tobacco Use  Smoking Status Never  Smokeless Tobacco Never    Allergies  Allergen Reactions   Ezetimibe-Simvastatin     REACTION: unsure true allergy   Objective:  There were no vitals filed for this visit. There is  no height or weight on file to calculate BMI. Constitutional Well developed. Well nourished.  Vascular Dorsalis pedis pulses palpable bilaterally. Posterior tibial pulses palpable bilaterally. Capillary refill normal to all digits.  No cyanosis or clubbing noted. Pedal hair growth normal.  Neurologic Normal speech. Oriented to person, place, and time. Epicritic sensation to light touch grossly present bilaterally.  Dermatologic Nails well groomed and normal in appearance. No open wounds. No skin lesions.  Orthopedic: Pain on palpation to the right fibula as well as medial malleolus.  Pain with range of motion of the ankle joint.  Some deep intra-articular ankle pain noted.  No ecchymosis noted no bruising or swelling noted   Radiographs: 3 views of skeletally more mature adult right ankle views were reviewed.  Spiral oblique fracture of the lateral malleolus seen.  Unable to  appreciate the medial clear space might be radiographic views. Assessment:   1. Closed nondisplaced fracture of lateral malleolus of right fibula, initial encounter    Plan:  Patient was evaluated and treated and all questions answered.  Right fibular ankle fracture with a possible medial clear gapping -All questions and concerns were discussed with the patient in extensive detail. -Radiographically I am not able to appreciate the medial clear space at this time I discussed with the patient she will benefit from cam boot immobilization to stress the ankle joint.  If there is an increase in medial clear space we will discuss surgical options at that time.  I will see her back again in 2 weeks I encouraged her to walk with a cam boot for now.  She states understanding -Can boot was dispensed  No follow-ups on file.

## 2022-01-04 ENCOUNTER — Ambulatory Visit: Payer: 59 | Admitting: Podiatry

## 2022-07-05 ENCOUNTER — Encounter (HOSPITAL_COMMUNITY): Payer: Self-pay

## 2022-07-05 ENCOUNTER — Emergency Department (HOSPITAL_COMMUNITY): Payer: Medicare Other

## 2022-07-05 ENCOUNTER — Other Ambulatory Visit: Payer: Self-pay

## 2022-07-05 ENCOUNTER — Emergency Department (HOSPITAL_COMMUNITY)
Admission: EM | Admit: 2022-07-05 | Discharge: 2022-07-05 | Disposition: A | Discharge status: Home / Self Care | Payer: Medicare Other | Attending: Emergency Medicine | Admitting: Emergency Medicine

## 2022-07-05 DIAGNOSIS — K219 Gastro-esophageal reflux disease without esophagitis: Secondary | ICD-10-CM | POA: Diagnosis not present

## 2022-07-05 DIAGNOSIS — R079 Chest pain, unspecified: Secondary | ICD-10-CM

## 2022-07-05 DIAGNOSIS — R531 Weakness: Secondary | ICD-10-CM | POA: Diagnosis not present

## 2022-07-05 DIAGNOSIS — Z7982 Long term (current) use of aspirin: Secondary | ICD-10-CM | POA: Diagnosis not present

## 2022-07-05 LAB — HEPATIC FUNCTION PANEL
ALT: 13 U/L (ref 0–44)
AST: 17 U/L (ref 15–41)
Albumin: 3.7 g/dL (ref 3.5–5.0)
Alkaline Phosphatase: 44 U/L (ref 38–126)
Bilirubin, Direct: 0.2 mg/dL (ref 0.0–0.2)
Indirect Bilirubin: 0.4 mg/dL (ref 0.3–0.9)
Total Bilirubin: 0.6 mg/dL (ref 0.3–1.2)
Total Protein: 7.5 g/dL (ref 6.5–8.1)

## 2022-07-05 LAB — CBC
HCT: 31 % — ABNORMAL LOW (ref 36.0–46.0)
Hemoglobin: 10.2 g/dL — ABNORMAL LOW (ref 12.0–15.0)
MCH: 29.7 pg (ref 26.0–34.0)
MCHC: 32.9 g/dL (ref 30.0–36.0)
MCV: 90.1 fL (ref 80.0–100.0)
Platelets: 185 10*3/uL (ref 150–400)
RBC: 3.44 MIL/uL — ABNORMAL LOW (ref 3.87–5.11)
RDW: 12.5 % (ref 11.5–15.5)
WBC: 8.4 10*3/uL (ref 4.0–10.5)
nRBC: 0 % (ref 0.0–0.2)

## 2022-07-05 LAB — URINALYSIS, ROUTINE W REFLEX MICROSCOPIC
Bilirubin Urine: NEGATIVE
Glucose, UA: 50 mg/dL — AB
Hgb urine dipstick: NEGATIVE
Ketones, ur: NEGATIVE mg/dL
Nitrite: NEGATIVE
Protein, ur: NEGATIVE mg/dL
Specific Gravity, Urine: 1.011 (ref 1.005–1.030)
pH: 7 (ref 5.0–8.0)

## 2022-07-05 LAB — BASIC METABOLIC PANEL
Anion gap: 10 (ref 5–15)
BUN: 9 mg/dL (ref 8–23)
CO2: 26 mmol/L (ref 22–32)
Calcium: 9.2 mg/dL (ref 8.9–10.3)
Chloride: 98 mmol/L (ref 98–111)
Creatinine, Ser: 0.8 mg/dL (ref 0.44–1.00)
GFR, Estimated: >60 mL/min (ref >60)
Glucose, Bld: 253 mg/dL — ABNORMAL HIGH (ref 70–99)
Potassium: 3.3 mmol/L — ABNORMAL LOW (ref 3.5–5.1)
Sodium: 134 mmol/L — ABNORMAL LOW (ref 135–145)

## 2022-07-05 LAB — LIPASE, BLOOD: Lipase: 28 U/L (ref 11–51)

## 2022-07-05 LAB — TROPONIN I (HIGH SENSITIVITY): Troponin I (High Sensitivity): 5 ng/L (ref <18)

## 2022-07-05 LAB — CBG MONITORING, ED: Glucose-Capillary: 211 mg/dL — ABNORMAL HIGH (ref 70–99)

## 2022-07-05 MED ORDER — FAMOTIDINE 20 MG PO TABS
20.0000 mg | ORAL_TABLET | Freq: Once | ORAL | Status: AC
  Administered 2022-07-05: 20 mg via ORAL
  Filled 2022-07-05: qty 1

## 2022-07-05 MED ORDER — ALUM & MAG HYDROXIDE-SIMETH 200-200-20 MG/5ML PO SUSP
30.0000 mL | Freq: Once | ORAL | Status: AC
  Administered 2022-07-05: 30 mL via ORAL
  Filled 2022-07-05: qty 30

## 2022-07-05 NOTE — Discharge Instructions (Signed)
1.  Use the referral number in your discharge instructions to help find a family doctor.  Schedule follow-up with soon as possible. 2.  Increase your omeprazole to 40 mg daily.  You may take 20 mg in the morning and 20 mg in the evening. 3.  Be sure to take your blood pressure medication every morning as prescribed. 4.  Return to the emergency department for recheck if you have new worsening or concerning symptoms.

## 2022-07-05 NOTE — ED Triage Notes (Signed)
Patient arrived POV with daughter in law. Patient reports chest pain and extreme fatigue. Awake and alert, unsure of baseline, respirations even and unlabored. NAD at this time

## 2022-07-05 NOTE — ED Provider Notes (Signed)
[place] EMERGENCY DEPARTMENT AT [facility] Provider Note   CSN: [id number] Arrival date & time: 07/05/22  1710     History  Chief Complaint  Patient presents with   Fatigue   Chest Pain         [name] is a 82 y.o. female.  HPI Patient has just recently come from [place] to live with her son and daughter-in-law.  She had previously been with the other son and his wife.  Situation had gotten difficult and stressful and thus moved here to be with her other family.  She is here with her daughter-in-law.  Her daughter-in-law reports that the patient has had problems with weight loss and poor appetite.  She reports that thing that that the more concern is evening was a complaint of chest pain.  The patient reports that she often gets full very quickly and sometimes only eats a half of a peanut butter jelly sandwich.  She does report that she has a known hiatal hernia.  She reports she is mostly compliant with taking her medications for this.  She reports that food just does not taste good to her a lot of the time.  This is all been more noticeable over about the past month.  Patient denies shortness of breath.  She denies abdominal pain.  She denies pain burning urgency with urination.  No swelling in the legs.    Home Medications Prior to Admission medications   Medication Sig Start Date End Date Taking? Authorizing Provider  acetaminophen (TYLENOL) 325 MG tablet Take 650 mg by mouth every 4 (four) hours as needed.    [provider]  aspirin EC 81 MG tablet Take 81 mg by mouth daily.    [provider]  azithromycin (ZITHROMAX Z-PAK) 250 MG tablet Take 2 tablets (500 mg) on  Day 1,  followed by 1 tablet (250 mg) once daily on Days 2 through 5. 07/30/20   Tommi Rumps, PA-C  carboxymethylcellulose (REFRESH TEARS) 0.5 % SOLN 1 drop 2 (two) times daily as needed.    [provider]  Cholecalciferol (VITAMIN D3) 50000 units TABS Take  by mouth every 30 (thirty) days.     [provider]  cyclobenzaprine (FLEXERIL) 5 MG tablet Take 5 mg by mouth 3 (three) times daily as needed for muscle spasms.    [provider]  erythromycin (ERY-TAB) 250 MG EC tablet  10/10/17   [provider]  glimepiride (AMARYL) 4 MG tablet  09/15/17   [provider]  HYDROcodone-acetaminophen (NORCO/VICODIN) 5-325 MG tablet  10/09/17   [provider]  hydrOXYzine (ATARAX/VISTARIL) 10 MG tablet Take 10 mg by mouth every 8 (eight) hours as needed.    [provider]  JANUVIA 100 MG tablet  09/15/17   [provider]  LORazepam (ATIVAN) 0.5 MG tablet Take 0.5 mg by mouth 2 (two) times daily as needed. 1 Tab in the evening.  1 tab AM, PRN    [provider]  Melatonin 5 MG TABS Take by mouth at bedtime.    [provider]  Olmesartan-amLODIPine-HCTZ 40-5-12.5 MG TABS  09/13/17   [provider]  rosuvastatin (CRESTOR) 10 MG tablet  09/15/17   [provider]  senna-docusate (SENEXON-S) 8.6-50 MG tablet Take by mouth.    [provider]  sertraline (ZOLOFT) 100 MG tablet  09/15/17   [provider]      Allergies    Ezetimibe-simvastatin  Review of Systems   Review of Systems  Physical Exam Updated Vital Signs BP (!) 161/94   Pulse 70   Temp 99.1 F (37.3 C) (Oral)   Resp 17   Ht 5\' 4"  (1.626 m)   Wt 59 kg   SpO2 92%   BMI 22.31 kg/m  Physical Exam Constitutional:      Comments: Alert nontoxic well-nourished well-developed.  Good condition for age.  Eyes:     Extraocular Movements: Extraocular movements intact.  Cardiovascular:     Rate and Rhythm: Normal rate and regular rhythm.  Pulmonary:     Effort: Pulmonary effort is normal.     Breath sounds: Normal breath sounds.  Abdominal:     General: There is no distension.     Palpations: Abdomen is soft.     Tenderness: There is no abdominal tenderness.  Musculoskeletal:         General: No swelling or tenderness. Normal range of motion.     Right lower leg: No edema.     Left lower leg: No edema.     Comments: No peripheral edema.  Feet and lower legs are in very good condition.  Skin:    General: Skin is warm and dry.  Neurological:     General: No focal deficit present.     Mental Status: She is oriented to person, place, and time.     Motor: No weakness.     Coordination: Coordination normal.     Comments: Focal weakness.  Patient can maneuver to push herself up in the stretcher and lean forward etc. without any focal deficits.  Psychiatric:        Mood and Affect: Mood normal.     ED Results / Procedures / Treatments   Labs (all labs ordered are listed, but only abnormal results are displayed) Labs Reviewed  BASIC METABOLIC PANEL - Abnormal; Notable for the following components:      Result Value   Sodium 134 (*)    Potassium 3.3 (*)    Glucose, Bld 253 (*)    All other components within normal limits  CBC - Abnormal; Notable for the following components:   RBC 3.44 (*)    Hemoglobin 10.2 (*)    HCT 31.0 (*)    All other components within normal limits  URINALYSIS, ROUTINE W REFLEX MICROSCOPIC - Abnormal; Notable for the following components:   APPearance HAZY (*)    Glucose, UA 50 (*)    Leukocytes,Ua LARGE (*)    Bacteria, UA RARE (*)    All other components within normal limits  CBG MONITORING, ED - Abnormal; Notable for the following components:   Glucose-Capillary 211 (*)    All other components within normal limits  LIPASE, BLOOD  HEPATIC FUNCTION PANEL  TROPONIN I (HIGH SENSITIVITY)  TROPONIN I (HIGH SENSITIVITY)    EKG EKG Interpretation  Date/Time:  Tuesday July 05 2022 17:26:09 EDT Ventricular Rate:  75 PR Interval:  143 QRS Duration: 82 QT Interval:  404 QTC Calculation: 452 R Axis:   27 Text Interpretation: Sinus rhythm Atrial premature complex Borderline T wave abnormalities duplicate Confirmed by Arby Barrette [phone]) on 07/05/2022 6:25:42 PM  Radiology DG Chest 2 View  Result Date: 07/05/2022 CLINICAL DATA:  Chest pain and extreme fatigue. EXAM: CHEST - 2 VIEW COMPARISON:  05/01/2021 FINDINGS: Normal heart size and pulmonary vascularity. No focal airspace disease or consolidation in the lungs. No blunting of costophrenic angles. No pneumothorax. Mediastinal contours appear intact.  Calcification of the aorta. Degenerative changes in the spine and shoulders. Old appearing fracture deformity partially visualized in the proximal left humerus. IMPRESSION: No active cardiopulmonary disease. Electronically Signed   By: Burman Nieves M.D.   On: 07/05/2022 19:43    Procedures Procedures    Medications Ordered in ED Medications  alum & mag hydroxide-simeth (MAALOX/MYLANTA) 200-200-20 MG/5ML suspension 30 mL (30 mLs Oral Given 07/05/22 1940)  famotidine (PEPCID) tablet 20 mg (20 mg Oral Given 07/05/22 1940)    ED Course/ Medical Decision Making/ A&P                             Medical Decision Making Amount and/or Complexity of Data Reviewed Labs: ordered. Radiology: ordered.  Risk OTC drugs.   Patient presents with several generalized symptoms.  She has been having decreased appetite and general weakness reportedly for several weeks to a month.  However, she did just arrive with her son and daughter-in-law yesterday and they are starting to establish care for her in [place].  Main concern this evening was the patient reporting of feeling of chest pressure or discomfort sometimes when eating and sometimes when talking.  Will proceed with diagnostic evaluation for ACS\metabolic derangement\infectious cause.  Clinically patient is well in appearance.  She is alert and bright.  No hypotension.  Physical exam normal.  Urinalysis shows a specimen contaminated with 21-50 squamous epithelial cells.  White blood cells only 6-10 and nitrite negative.  With the patient denying any symptoms of dysuria  urgency or frequency, no abdominal pain, no flank pain or fever I have low suspicion for UTI.  White blood cell count normal at 8.4.  Mild anemia at 10.2\31, normal platelets.  Patient baseline ranges from 10 to 13 mg deciliters most consistently around the low 11 mg/dL.  Anemia is mild and less likely to be accounting for symptoms at this time.  Appropriate for continued monitoring and outpatient management.   Troponin normal.  EKG no acute ischemic appearance.  At this time chest pain does not appear to be secondary to ACS.  No associated symptoms.  Seems to more correspond with eating and talking.  [place] for continued outpatient management and further evaluation is needed.  I suspect etiology of chest discomfort and intermittent dysphagia is hiatal hernia or GERD.  I have recommended patient increase her omeprazole dose to twice daily and supplement dietary intake with very soft foods and Ensure.  Patient is well in appearance and alert.  Plan will be to schedule follow-up and get established care locally.  Encouraged to return if there are any worsening or changing symptoms.         Final Clinical Impression(s) / ED Diagnoses Final diagnoses:  Chest pain, unspecified type  Gastroesophageal reflux disease, unspecified whether esophagitis present  Weakness    Rx / DC Orders ED Discharge Orders     None         Arby Barrette, MD 07/05/22 2112

## 2022-07-14 ENCOUNTER — Ambulatory Visit: Payer: 59 | Admitting: Adult Health

## 2022-07-15 ENCOUNTER — Other Ambulatory Visit: Payer: Self-pay

## 2022-07-15 ENCOUNTER — Emergency Department (HOSPITAL_COMMUNITY)
Admission: EM | Admit: 2022-07-15 | Discharge: 2022-07-16 | Disposition: A | Discharge status: Home / Self Care | Payer: Medicare Other | Attending: Emergency Medicine | Admitting: Emergency Medicine

## 2022-07-15 ENCOUNTER — Encounter (HOSPITAL_COMMUNITY): Payer: Self-pay

## 2022-07-15 DIAGNOSIS — E876 Hypokalemia: Secondary | ICD-10-CM

## 2022-07-15 DIAGNOSIS — N189 Chronic kidney disease, unspecified: Secondary | ICD-10-CM | POA: Diagnosis not present

## 2022-07-15 DIAGNOSIS — N390 Urinary tract infection, site not specified: Secondary | ICD-10-CM | POA: Diagnosis not present

## 2022-07-15 DIAGNOSIS — E1143 Type 2 diabetes mellitus with diabetic autonomic (poly)neuropathy: Secondary | ICD-10-CM | POA: Diagnosis not present

## 2022-07-15 DIAGNOSIS — R197 Diarrhea, unspecified: Secondary | ICD-10-CM | POA: Insufficient documentation

## 2022-07-15 DIAGNOSIS — B9689 Other specified bacterial agents as the cause of diseases classified elsewhere: Secondary | ICD-10-CM | POA: Diagnosis not present

## 2022-07-15 DIAGNOSIS — Z79899 Other long term (current) drug therapy: Secondary | ICD-10-CM | POA: Diagnosis not present

## 2022-07-15 DIAGNOSIS — I129 Hypertensive chronic kidney disease with stage 1 through stage 4 chronic kidney disease, or unspecified chronic kidney disease: Secondary | ICD-10-CM | POA: Diagnosis not present

## 2022-07-15 DIAGNOSIS — Z7982 Long term (current) use of aspirin: Secondary | ICD-10-CM | POA: Diagnosis not present

## 2022-07-15 DIAGNOSIS — Z7984 Long term (current) use of oral hypoglycemic drugs: Secondary | ICD-10-CM | POA: Diagnosis not present

## 2022-07-15 DIAGNOSIS — R55 Syncope and collapse: Secondary | ICD-10-CM

## 2022-07-15 LAB — CBC WITH DIFFERENTIAL/PLATELET
Abs Immature Granulocytes: 0.03 10*3/uL (ref 0.00–0.07)
Basophils Absolute: 0 10*3/uL (ref 0.0–0.1)
Basophils Relative: 0 %
Eosinophils Absolute: 0 10*3/uL (ref 0.0–0.5)
Eosinophils Relative: 0 %
HCT: 29.8 % — ABNORMAL LOW (ref 36.0–46.0)
Hemoglobin: 9.6 g/dL — ABNORMAL LOW (ref 12.0–15.0)
Immature Granulocytes: 0 %
Lymphocytes Relative: 15 %
Lymphs Abs: 1.6 10*3/uL (ref 0.7–4.0)
MCH: 28.9 pg (ref 26.0–34.0)
MCHC: 32.2 g/dL (ref 30.0–36.0)
MCV: 89.8 fL (ref 80.0–100.0)
Monocytes Absolute: 0.5 10*3/uL (ref 0.1–1.0)
Monocytes Relative: 5 %
Neutro Abs: 8.5 10*3/uL — ABNORMAL HIGH (ref 1.7–7.7)
Neutrophils Relative %: 80 %
Platelets: 193 10*3/uL (ref 150–400)
RBC: 3.32 MIL/uL — ABNORMAL LOW (ref 3.87–5.11)
RDW: 12.8 % (ref 11.5–15.5)
WBC: 10.7 10*3/uL — ABNORMAL HIGH (ref 4.0–10.5)
nRBC: 0 % (ref 0.0–0.2)

## 2022-07-15 MED ORDER — LACTATED RINGERS IV BOLUS
1000.0000 mL | Freq: Once | INTRAVENOUS | Status: AC
  Administered 2022-07-15: 1000 mL via INTRAVENOUS

## 2022-07-15 NOTE — ED Provider Notes (Signed)
[place] EMERGENCY DEPARTMENT AT [facility] Provider Note   CSN: [id number] Arrival date & time: 07/15/22  2258     History  Chief Complaint  Patient presents with   Diarrhea    Pt BIBA from home after having several episodes of diarrhea x1 day. Pt states she tried Imodium w/ no relief.    [name] is a 82 y.o. female.  With PMH of HTN, HLD, GERD, hiatal hernia, DM, gastroparesis, CKD who presents with diarrhea.   Diarrhea      Home Medications Prior to Admission medications   Medication Sig Start Date End Date Taking? Authorizing Provider  acetaminophen (TYLENOL) 325 MG tablet Take 650 mg by mouth every 4 (four) hours as needed.    [provider]  aspirin EC 81 MG tablet Take 81 mg by mouth daily.    [provider]  azithromycin (ZITHROMAX Z-PAK) 250 MG tablet Take 2 tablets (500 mg) on  Day 1,  followed by 1 tablet (250 mg) once daily on Days 2 through 5. 07/30/20   Tommi Rumps, PA-C  carboxymethylcellulose (REFRESH TEARS) 0.5 % SOLN 1 drop 2 (two) times daily as needed.    [provider]  Cholecalciferol (VITAMIN D3) 50000 units TABS Take by mouth every 30 (thirty) days.     [provider]  cyclobenzaprine (FLEXERIL) 5 MG tablet Take 5 mg by mouth 3 (three) times daily as needed for muscle spasms.    [provider]  erythromycin (ERY-TAB) 250 MG EC tablet  10/10/17   [provider]  glimepiride (AMARYL) 4 MG tablet  09/15/17   [provider]  HYDROcodone-acetaminophen (NORCO/VICODIN) 5-325 MG tablet  10/09/17   [provider]  hydrOXYzine (ATARAX/VISTARIL) 10 MG tablet Take 10 mg by mouth every 8 (eight) hours as needed.    [provider]  JANUVIA 100 MG tablet  09/15/17   [provider]  LORazepam (ATIVAN) 0.5 MG tablet Take 0.5 mg by mouth 2 (two) times daily as needed. 1 Tab in the evening.  1 tab AM, PRN    [provider]  Melatonin 5 MG  TABS Take by mouth at bedtime.    [provider]  Olmesartan-amLODIPine-HCTZ 40-5-12.5 MG TABS  09/13/17   [provider]  rosuvastatin (CRESTOR) 10 MG tablet  09/15/17   [provider]  senna-docusate (SENEXON-S) 8.6-50 MG tablet Take by mouth.    [provider]  sertraline (ZOLOFT) 100 MG tablet  09/15/17   [provider]      Allergies    Ezetimibe-simvastatin    Review of Systems   Review of Systems  Gastrointestinal:  Positive for diarrhea.    Physical Exam Updated Vital Signs BP 139/76 (BP Location: Left Arm)   Pulse 75   Temp 97.9 F (36.6 C) (Oral)   Resp 17   Ht 5\' 4"  (1.626 m)   SpO2 100%   BMI 22.31 kg/m  Physical Exam  ED Results / Procedures / Treatments   Labs (all labs ordered are listed, but only abnormal results are displayed) Labs Reviewed  COMPREHENSIVE METABOLIC PANEL  CBC WITH DIFFERENTIAL/PLATELET  LIPASE, BLOOD  URINALYSIS, ROUTINE W REFLEX MICROSCOPIC    EKG None  Radiology No results found.  Procedures Procedures    Medications Ordered in ED Medications  lactated ringers bolus 1,000 mL (has no administration in time range)    ED Course/ Medical Decision Making/ A&P  Medical Decision Making Amount and/or Complexity of Data Reviewed Labs: ordered.      Final Clinical Impression(s) / ED Diagnoses Final diagnoses:  None    Rx / DC Orders ED Discharge Orders     None

## 2022-07-16 ENCOUNTER — Emergency Department (HOSPITAL_COMMUNITY): Payer: Medicare Other

## 2022-07-16 LAB — URINALYSIS, ROUTINE W REFLEX MICROSCOPIC
Bilirubin Urine: NEGATIVE
Glucose, UA: 150 mg/dL — AB
Ketones, ur: NEGATIVE mg/dL
Nitrite: NEGATIVE
Protein, ur: NEGATIVE mg/dL
Specific Gravity, Urine: 1.009 (ref 1.005–1.030)
WBC, UA: >50 WBC/hpf (ref 0–5)
pH: 6 (ref 5.0–8.0)

## 2022-07-16 LAB — CBG MONITORING, ED
Glucose-Capillary: 246 mg/dL — ABNORMAL HIGH (ref 70–99)
Glucose-Capillary: 163 mg/dL — ABNORMAL HIGH (ref 70–99)

## 2022-07-16 LAB — COMPREHENSIVE METABOLIC PANEL
ALT: 15 U/L (ref 0–44)
AST: 17 U/L (ref 15–41)
Albumin: 3.6 g/dL (ref 3.5–5.0)
Alkaline Phosphatase: 39 U/L (ref 38–126)
Anion gap: 9 (ref 5–15)
BUN: 14 mg/dL (ref 8–23)
CO2: 28 mmol/L (ref 22–32)
Calcium: 8.3 mg/dL — ABNORMAL LOW (ref 8.9–10.3)
Chloride: 99 mmol/L (ref 98–111)
Creatinine, Ser: 0.85 mg/dL (ref 0.44–1.00)
GFR, Estimated: >60 mL/min (ref >60)
Glucose, Bld: 320 mg/dL — ABNORMAL HIGH (ref 70–99)
Potassium: 2.9 mmol/L — ABNORMAL LOW (ref 3.5–5.1)
Sodium: 136 mmol/L (ref 135–145)
Total Bilirubin: 1 mg/dL (ref 0.3–1.2)
Total Protein: 6.9 g/dL (ref 6.5–8.1)

## 2022-07-16 LAB — LIPASE, BLOOD: Lipase: 41 U/L (ref 11–51)

## 2022-07-16 MED ORDER — ONDANSETRON 4 MG PO TBDP: 4.0000 mg | ORAL_TABLET | Freq: Three times a day (TID) | ORAL | 0 refills | Status: DC | PRN

## 2022-07-16 MED ORDER — POTASSIUM CHLORIDE ER 10 MEQ PO TBCR: 10.0000 meq | EXTENDED_RELEASE_TABLET | Freq: Two times a day (BID) | ORAL | 0 refills | Status: AC

## 2022-07-16 MED ORDER — INSULIN ASPART 100 UNIT/ML IJ SOLN
5.0000 [IU] | Freq: Once | INTRAMUSCULAR | Status: AC
  Administered 2022-07-16: 5 [IU] via SUBCUTANEOUS
  Filled 2022-07-16: qty 0.05

## 2022-07-16 MED ORDER — INSULIN ASPART 100 UNIT/ML IV SOLN
6.0000 [IU] | Freq: Once | INTRAVENOUS | Status: DC
  Filled 2022-07-16: qty 0.06

## 2022-07-16 MED ORDER — SODIUM CHLORIDE 0.9 % IV SOLN
1.0000 g | Freq: Once | INTRAVENOUS | Status: AC
  Administered 2022-07-16: 1 g via INTRAVENOUS
  Filled 2022-07-16: qty 10

## 2022-07-16 MED ORDER — CEFDINIR 300 MG PO CAPS: 300.0000 mg | ORAL_CAPSULE | Freq: Two times a day (BID) | ORAL | 0 refills | Status: AC

## 2022-07-16 MED ORDER — POTASSIUM CHLORIDE 20 MEQ PO PACK
60.0000 meq | PACK | Freq: Once | ORAL | Status: AC
  Administered 2022-07-16: 60 meq via ORAL
  Filled 2022-07-16: qty 3

## 2022-07-16 NOTE — ED Notes (Signed)
Pt ordered to ambulate but per daughter-in- law she is only a stand pivot and w/c bound for the most part @baseline . MD made aware.

## 2022-07-16 NOTE — Discharge Instructions (Signed)
You have been seen today in the Emergency Department (ED)  for near syncope (almost passing out).  Your workup including labs, CT scan of the head and EKG show reassuring results.  Your symptoms are likely related to mild dehydration and urinary tract infection found on your workup today.  Continue taking the antibiotic cefdinir as prescribed and do not miss any doses.  Your potassium was also slightly low so take the potassium supplementation as prescribed.  You can take Zofran as needed for nausea or vomiting.  Please call your regular doctor as soon as possible to schedule the next available clinic appointment to follow up with him/her regarding your visit to the ED and your symptoms. You should ideally follow up with your primary doctor within one week.  Return to the Emergency Department (ED)  if you have any further syncopal episodes (pass out) or develop ANY chest pain, pressure, tightness, palpitations, trouble breathing, sudden sweating, weakness of the arms or legs, facial droop or other symptoms that concern you.

## 2022-08-09 ENCOUNTER — Ambulatory Visit: Payer: Medicare Other | Admitting: Family Medicine

## 2023-03-02 ENCOUNTER — Emergency Department (HOSPITAL_COMMUNITY): Payer: Medicare Other

## 2023-03-02 ENCOUNTER — Other Ambulatory Visit: Payer: Self-pay

## 2023-03-02 ENCOUNTER — Encounter (HOSPITAL_COMMUNITY): Payer: Self-pay | Admitting: *Deleted

## 2023-03-02 ENCOUNTER — Inpatient Hospital Stay (HOSPITAL_COMMUNITY)
Admission: EM | Admit: 2023-03-02 | Discharge: 2023-03-06 | DRG: 177 | Disposition: A | Discharge status: Skilled Nursing Facility | Payer: Medicare Other | Source: Skilled Nursing Facility | Attending: Internal Medicine | Admitting: Internal Medicine

## 2023-03-02 DIAGNOSIS — F32A Depression, unspecified: Secondary | ICD-10-CM | POA: Diagnosis present

## 2023-03-02 DIAGNOSIS — E119 Type 2 diabetes mellitus without complications: Secondary | ICD-10-CM

## 2023-03-02 DIAGNOSIS — J690 Pneumonitis due to inhalation of food and vomit: Secondary | ICD-10-CM | POA: Diagnosis not present

## 2023-03-02 DIAGNOSIS — Z800 Family history of malignant neoplasm of digestive organs: Secondary | ICD-10-CM

## 2023-03-02 DIAGNOSIS — R0902 Hypoxemia: Secondary | ICD-10-CM | POA: Diagnosis present

## 2023-03-02 DIAGNOSIS — K219 Gastro-esophageal reflux disease without esophagitis: Secondary | ICD-10-CM | POA: Diagnosis present

## 2023-03-02 DIAGNOSIS — D649 Anemia, unspecified: Secondary | ICD-10-CM | POA: Diagnosis present

## 2023-03-02 DIAGNOSIS — K3184 Gastroparesis: Secondary | ICD-10-CM | POA: Diagnosis present

## 2023-03-02 DIAGNOSIS — Z7982 Long term (current) use of aspirin: Secondary | ICD-10-CM

## 2023-03-02 DIAGNOSIS — E1165 Type 2 diabetes mellitus with hyperglycemia: Secondary | ICD-10-CM | POA: Diagnosis present

## 2023-03-02 DIAGNOSIS — Z8249 Family history of ischemic heart disease and other diseases of the circulatory system: Secondary | ICD-10-CM

## 2023-03-02 DIAGNOSIS — Z79899 Other long term (current) drug therapy: Secondary | ICD-10-CM

## 2023-03-02 DIAGNOSIS — Z794 Long term (current) use of insulin: Secondary | ICD-10-CM

## 2023-03-02 DIAGNOSIS — Z8049 Family history of malignant neoplasm of other genital organs: Secondary | ICD-10-CM

## 2023-03-02 DIAGNOSIS — W01198A Fall on same level from slipping, tripping and stumbling with subsequent striking against other object, initial encounter: Secondary | ICD-10-CM | POA: Diagnosis present

## 2023-03-02 DIAGNOSIS — R410 Disorientation, unspecified: Secondary | ICD-10-CM

## 2023-03-02 DIAGNOSIS — E876 Hypokalemia: Secondary | ICD-10-CM | POA: Diagnosis not present

## 2023-03-02 DIAGNOSIS — J918 Pleural effusion in other conditions classified elsewhere: Secondary | ICD-10-CM | POA: Diagnosis present

## 2023-03-02 DIAGNOSIS — Z1152 Encounter for screening for COVID-19: Secondary | ICD-10-CM

## 2023-03-02 DIAGNOSIS — J189 Pneumonia, unspecified organism: Principal | ICD-10-CM | POA: Diagnosis present

## 2023-03-02 DIAGNOSIS — R1310 Dysphagia, unspecified: Secondary | ICD-10-CM | POA: Diagnosis present

## 2023-03-02 DIAGNOSIS — Z888 Allergy status to other drugs, medicaments and biological substances status: Secondary | ICD-10-CM

## 2023-03-02 DIAGNOSIS — I10 Essential (primary) hypertension: Secondary | ICD-10-CM | POA: Diagnosis present

## 2023-03-02 DIAGNOSIS — Z66 Do not resuscitate: Secondary | ICD-10-CM | POA: Diagnosis present

## 2023-03-02 DIAGNOSIS — Z7984 Long term (current) use of oral hypoglycemic drugs: Secondary | ICD-10-CM

## 2023-03-02 DIAGNOSIS — F0393 Unspecified dementia, unspecified severity, with mood disturbance: Secondary | ICD-10-CM | POA: Diagnosis present

## 2023-03-02 DIAGNOSIS — R4182 Altered mental status, unspecified: Secondary | ICD-10-CM

## 2023-03-02 DIAGNOSIS — Z833 Family history of diabetes mellitus: Secondary | ICD-10-CM

## 2023-03-02 DIAGNOSIS — Z8673 Personal history of transient ischemic attack (TIA), and cerebral infarction without residual deficits: Secondary | ICD-10-CM

## 2023-03-02 DIAGNOSIS — J159 Unspecified bacterial pneumonia: Secondary | ICD-10-CM | POA: Diagnosis present

## 2023-03-02 DIAGNOSIS — Z803 Family history of malignant neoplasm of breast: Secondary | ICD-10-CM

## 2023-03-02 DIAGNOSIS — Z8042 Family history of malignant neoplasm of prostate: Secondary | ICD-10-CM

## 2023-03-02 DIAGNOSIS — F419 Anxiety disorder, unspecified: Secondary | ICD-10-CM | POA: Diagnosis present

## 2023-03-02 DIAGNOSIS — E785 Hyperlipidemia, unspecified: Secondary | ICD-10-CM | POA: Diagnosis present

## 2023-03-02 DIAGNOSIS — G9341 Metabolic encephalopathy: Secondary | ICD-10-CM | POA: Diagnosis present

## 2023-03-02 DIAGNOSIS — Z90710 Acquired absence of both cervix and uterus: Secondary | ICD-10-CM

## 2023-03-02 DIAGNOSIS — E1143 Type 2 diabetes mellitus with diabetic autonomic (poly)neuropathy: Secondary | ICD-10-CM | POA: Diagnosis present

## 2023-03-02 DIAGNOSIS — R821 Myoglobinuria: Secondary | ICD-10-CM | POA: Diagnosis present

## 2023-03-02 LAB — URINALYSIS, W/ REFLEX TO CULTURE (INFECTION SUSPECTED)
Bilirubin Urine: NEGATIVE
Glucose, UA: NEGATIVE mg/dL
Ketones, ur: NEGATIVE mg/dL
Nitrite: NEGATIVE
Protein, ur: NEGATIVE mg/dL
Specific Gravity, Urine: 1.016 (ref 1.005–1.030)
pH: 5 (ref 5.0–8.0)

## 2023-03-02 LAB — COMPREHENSIVE METABOLIC PANEL
ALT: 22 U/L (ref 0–44)
AST: 23 U/L (ref 15–41)
Albumin: 3.5 g/dL (ref 3.5–5.0)
Alkaline Phosphatase: 49 U/L (ref 38–126)
Anion gap: 14 (ref 5–15)
BUN: 19 mg/dL (ref 8–23)
CO2: 24 mmol/L (ref 22–32)
Calcium: 9.1 mg/dL (ref 8.9–10.3)
Chloride: 101 mmol/L (ref 98–111)
Creatinine, Ser: 1.04 mg/dL — ABNORMAL HIGH (ref 0.44–1.00)
GFR, Estimated: 53 mL/min — ABNORMAL LOW (ref >60)
Glucose, Bld: 139 mg/dL — ABNORMAL HIGH (ref 70–99)
Potassium: 2.7 mmol/L — CL (ref 3.5–5.1)
Sodium: 139 mmol/L (ref 135–145)
Total Bilirubin: 1.1 mg/dL (ref 0.0–1.2)
Total Protein: 7.2 g/dL (ref 6.5–8.1)

## 2023-03-02 LAB — CBC WITH DIFFERENTIAL/PLATELET
Abs Immature Granulocytes: 0.14 10*3/uL — ABNORMAL HIGH (ref 0.00–0.07)
Basophils Absolute: 0 10*3/uL (ref 0.0–0.1)
Basophils Relative: 0 %
Eosinophils Absolute: 0 10*3/uL (ref 0.0–0.5)
Eosinophils Relative: 0 %
HCT: 33.2 % — ABNORMAL LOW (ref 36.0–46.0)
Hemoglobin: 10.7 g/dL — ABNORMAL LOW (ref 12.0–15.0)
Immature Granulocytes: 1 %
Lymphocytes Relative: 4 %
Lymphs Abs: 0.6 10*3/uL — ABNORMAL LOW (ref 0.7–4.0)
MCH: 28.5 pg (ref 26.0–34.0)
MCHC: 32.2 g/dL (ref 30.0–36.0)
MCV: 88.3 fL (ref 80.0–100.0)
Monocytes Absolute: 0.6 10*3/uL (ref 0.1–1.0)
Monocytes Relative: 4 %
Neutro Abs: 15.1 10*3/uL — ABNORMAL HIGH (ref 1.7–7.7)
Neutrophils Relative %: 91 %
Platelets: 194 10*3/uL (ref 150–400)
RBC: 3.76 MIL/uL — ABNORMAL LOW (ref 3.87–5.11)
RDW: 13.2 % (ref 11.5–15.5)
WBC: 16.5 10*3/uL — ABNORMAL HIGH (ref 4.0–10.5)
nRBC: 0 % (ref 0.0–0.2)

## 2023-03-02 LAB — RESP PANEL BY RT-PCR (RSV, FLU A&B, COVID)  RVPGX2
Influenza A by PCR: NEGATIVE
Influenza B by PCR: NEGATIVE
Resp Syncytial Virus by PCR: NEGATIVE
SARS Coronavirus 2 by RT PCR: NEGATIVE

## 2023-03-02 LAB — I-STAT CG4 LACTIC ACID, ED: Lactic Acid, Venous: 1.4 mmol/L (ref 0.5–1.9)

## 2023-03-02 LAB — CK: Total CK: 245 U/L — ABNORMAL HIGH (ref 38–234)

## 2023-03-02 LAB — CBG MONITORING, ED
Glucose-Capillary: 146 mg/dL — ABNORMAL HIGH (ref 70–99)
Glucose-Capillary: 151 mg/dL — ABNORMAL HIGH (ref 70–99)
Glucose-Capillary: 167 mg/dL — ABNORMAL HIGH (ref 70–99)

## 2023-03-02 LAB — HEMOGLOBIN A1C
Hgb A1c MFr Bld: 6.5 % — ABNORMAL HIGH (ref 4.8–5.6)
Mean Plasma Glucose: 139.85 mg/dL

## 2023-03-02 MED ORDER — POTASSIUM CHLORIDE CRYS ER 20 MEQ PO TBCR
40.0000 meq | EXTENDED_RELEASE_TABLET | Freq: Once | ORAL | Status: DC
  Filled 2023-03-02: qty 2

## 2023-03-02 MED ORDER — SERTRALINE HCL 100 MG PO TABS
100.0000 mg | ORAL_TABLET | Freq: Every day | ORAL | Status: DC
  Administered 2023-03-02 – 2023-03-05 (×4): 100 mg via ORAL
  Filled 2023-03-02 (×4): qty 1

## 2023-03-02 MED ORDER — ROSUVASTATIN CALCIUM 5 MG PO TABS
10.0000 mg | ORAL_TABLET | Freq: Every day | ORAL | Status: DC
  Administered 2023-03-02 – 2023-03-06 (×5): 10 mg via ORAL
  Filled 2023-03-02 (×5): qty 2

## 2023-03-02 MED ORDER — PANTOPRAZOLE SODIUM 40 MG PO TBEC
40.0000 mg | DELAYED_RELEASE_TABLET | Freq: Every day | ORAL | Status: DC
  Administered 2023-03-02 – 2023-03-06 (×5): 40 mg via ORAL
  Filled 2023-03-02 (×5): qty 1

## 2023-03-02 MED ORDER — ENOXAPARIN SODIUM 40 MG/0.4ML IJ SOSY
40.0000 mg | PREFILLED_SYRINGE | INTRAMUSCULAR | Status: DC
  Administered 2023-03-02 – 2023-03-06 (×5): 40 mg via SUBCUTANEOUS
  Filled 2023-03-02 (×5): qty 0.4

## 2023-03-02 MED ORDER — POTASSIUM CHLORIDE 10 MEQ/100ML IV SOLN
10.0000 meq | INTRAVENOUS | Status: AC
  Administered 2023-03-02: 10 meq via INTRAVENOUS
  Filled 2023-03-02: qty 100

## 2023-03-02 MED ORDER — CEFTRIAXONE SODIUM 1 G IJ SOLR
1.0000 g | INTRAMUSCULAR | Status: DC
  Administered 2023-03-03: 1 g via INTRAVENOUS
  Filled 2023-03-02: qty 10

## 2023-03-02 MED ORDER — SODIUM CHLORIDE 0.9 % IV SOLN
500.0000 mg | Freq: Once | INTRAVENOUS | Status: AC
  Administered 2023-03-02: 500 mg via INTRAVENOUS
  Filled 2023-03-02: qty 5

## 2023-03-02 MED ORDER — ASPIRIN 81 MG PO TBEC
81.0000 mg | DELAYED_RELEASE_TABLET | Freq: Every day | ORAL | Status: DC
  Administered 2023-03-02 – 2023-03-06 (×5): 81 mg via ORAL
  Filled 2023-03-02 (×5): qty 1

## 2023-03-02 MED ORDER — ACETAMINOPHEN 325 MG PO TABS
650.0000 mg | ORAL_TABLET | Freq: Four times a day (QID) | ORAL | Status: DC | PRN
  Administered 2023-03-02 – 2023-03-04 (×4): 650 mg via ORAL
  Filled 2023-03-02 (×5): qty 2

## 2023-03-02 MED ORDER — FAMOTIDINE 20 MG PO TABS
20.0000 mg | ORAL_TABLET | Freq: Every evening | ORAL | Status: DC
Start: 2023-03-02 — End: 2023-03-06
  Administered 2023-03-02 – 2023-03-05 (×4): 20 mg via ORAL
  Filled 2023-03-02 (×3): qty 1

## 2023-03-02 MED ORDER — INSULIN GLARGINE-YFGN 100 UNIT/ML ~~LOC~~ SOLN
10.0000 [IU] | Freq: Two times a day (BID) | SUBCUTANEOUS | Status: DC
  Administered 2023-03-02 – 2023-03-06 (×7): 10 [IU] via SUBCUTANEOUS
  Filled 2023-03-02 (×10): qty 0.1

## 2023-03-02 MED ORDER — AMLODIPINE BESYLATE 5 MG PO TABS
5.0000 mg | ORAL_TABLET | Freq: Every day | ORAL | Status: DC
  Administered 2023-03-02 – 2023-03-05 (×4): 5 mg via ORAL
  Filled 2023-03-02 (×4): qty 1

## 2023-03-02 MED ORDER — IRBESARTAN 300 MG PO TABS
300.0000 mg | ORAL_TABLET | Freq: Every day | ORAL | Status: DC
  Administered 2023-03-02 – 2023-03-06 (×5): 300 mg via ORAL
  Filled 2023-03-02 (×5): qty 1

## 2023-03-02 MED ORDER — INSULIN ASPART 100 UNIT/ML IJ SOLN
0.0000 [IU] | Freq: Three times a day (TID) | INTRAMUSCULAR | Status: DC
  Administered 2023-03-02: 3 [IU] via SUBCUTANEOUS
  Administered 2023-03-03: 2 [IU] via SUBCUTANEOUS
  Administered 2023-03-03: 3 [IU] via SUBCUTANEOUS
  Administered 2023-03-03: 2 [IU] via SUBCUTANEOUS
  Administered 2023-03-04: 8 [IU] via SUBCUTANEOUS
  Administered 2023-03-04: 2 [IU] via SUBCUTANEOUS
  Administered 2023-03-04: 5 [IU] via SUBCUTANEOUS
  Administered 2023-03-05: 2 [IU] via SUBCUTANEOUS
  Administered 2023-03-05 – 2023-03-06 (×2): 3 [IU] via SUBCUTANEOUS

## 2023-03-02 MED ORDER — SODIUM CHLORIDE 0.9 % IV SOLN
500.0000 mg | INTRAVENOUS | Status: AC
  Administered 2023-03-03 – 2023-03-06 (×4): 500 mg via INTRAVENOUS
  Filled 2023-03-02 (×4): qty 5

## 2023-03-02 MED ORDER — LACTATED RINGERS IV BOLUS
1000.0000 mL | Freq: Once | INTRAVENOUS | Status: AC
  Administered 2023-03-02: 1000 mL via INTRAVENOUS

## 2023-03-02 MED ORDER — INSULIN GLARGINE-YFGN 100 UNIT/ML ~~LOC~~ SOLN: 10.0000 [IU] | Freq: Every day | SUBCUTANEOUS | Status: DC

## 2023-03-02 MED ORDER — SODIUM CHLORIDE 0.9 % IV SOLN
1.0000 g | Freq: Once | INTRAVENOUS | Status: AC
  Administered 2023-03-02: 1 g via INTRAVENOUS
  Filled 2023-03-02: qty 10

## 2023-03-02 MED ORDER — POTASSIUM CHLORIDE 10 MEQ/100ML IV SOLN
INTRAVENOUS | Status: AC
  Administered 2023-03-03: 10 meq via INTRAVENOUS
  Filled 2023-03-02: qty 100

## 2023-03-02 MED ORDER — ACETAMINOPHEN 650 MG RE SUPP
650.0000 mg | Freq: Four times a day (QID) | RECTAL | Status: DC | PRN
Start: 2023-03-02 — End: 2023-03-06

## 2023-03-02 NOTE — ED Notes (Signed)
Patient transported to X-ray

## 2023-03-02 NOTE — ED Provider Notes (Signed)
[place] EMERGENCY DEPARTMENT AT [facility] Provider Note   CSN: [id number] Arrival date & time: 03/02/23  7829     History  No chief complaint on file.  HPI [name] is a 83 y.o. female with history of CVA, diabetes, hypertension, CKD presenting for fall and altered mental status. Patient states that she fell sometime yesterday. Having difficulty describing the details of that fall. Denies any preceding chest pain or shortness of breath.  Does endorse recent cough. Patient resides at [facility] facility. Per the staff concerned for altered mental status and slurred speech.  Unsure when her last normal was. They report that she fell around 6 yesterday evening.  Also reports of malodorous urine.  States she was also initially hypoxic and required 3 L of oxygen. Patient is AAO x3.  HPI     Home Medications Prior to Admission medications   Medication Sig Start Date End Date Taking? Authorizing Provider  acetaminophen (TYLENOL) 500 MG tablet Take 500 mg by mouth 2 (two) times daily as needed for mild pain (pain score 1-3).   Yes [provider]  aspirin EC 81 MG tablet Take 81 mg by mouth daily.   Yes [provider]  bisacodyl (DULCOLAX) 5 MG EC tablet Take 10 mg by mouth 2 (two) times daily as needed for moderate constipation.   Yes [provider]  famotidine (PEPCID) 20 MG tablet Take 20 mg by mouth every evening. 01/18/23  Yes [provider]  fluticasone (FLONASE) 50 MCG/ACT nasal spray Place 1 spray into both nostrils 2 (two) times daily as needed for allergies. 09/07/22  Yes [provider]  insulin glargine (LANTUS) 100 UNIT/ML injection Inject 25 Units into the skin at bedtime.   Yes [provider]  LANTUS SOLOSTAR 100 UNIT/ML Solostar Pen Inject 15 Units into the skin in the morning. 02/28/23  Yes [provider]  LINZESS 72 MCG capsule Take 72 mcg by mouth every morning. 02/25/23  Yes [provider]  magnesium hydroxide (MILK OF MAGNESIA) 400 MG/5ML suspension Take 30 mLs by mouth daily as needed for mild constipation.   Yes [provider]  metFORMIN (GLUCOPHAGE) 500 MG tablet Take 500 mg by mouth 2 (two) times daily. 02/06/23  Yes [provider]  nystatin cream (MYCOSTATIN) Apply 1 Application topically 2 (two) times daily. 02/11/23  Yes [provider]  Olmesartan-amLODIPine-HCTZ 40-5-12.5 MG TABS Take 1 tablet by mouth in the morning. 09/13/17  Yes [provider]  omeprazole (PRILOSEC) 20 MG capsule Take 20 mg by mouth 2 (two) times daily.   Yes [provider]  ondansetron (ZOFRAN) 4 MG tablet Take 4 mg by mouth daily as needed for nausea. 09/19/22  Yes [provider]  oseltamivir (TAMIFLU) 75 MG capsule Take 75 mg by mouth daily. For 10 days started on 1.28.2025 02/27/23  Yes [provider]  rosuvastatin (CRESTOR) 10 MG tablet  09/15/17  Yes [provider]  sertraline (ZOLOFT) 100 MG tablet  09/15/17  Yes [provider]  potassium chloride (KLOR-CON) 10 MEQ tablet Take 1 tablet (10 mEq total) by mouth 2 (two) times daily for 3 days. Patient not taking: Reported on 03/02/2023 07/16/22 07/19/22  Mardene Sayer, MD      Allergies    Ezetimibe-simvastatin    Review of Systems   See HPI for pertinent positives  Physical Exam Updated Vital Signs BP (!) 158/87   Pulse (!) 104   Temp 100.1 F (  37.8 C) (Oral)   Resp 16  Physical Exam Vitals and nursing note reviewed.  HENT:     Head: Normocephalic and atraumatic.     Mouth/Throat:     Mouth: Mucous membranes are moist.  Eyes:     General:        Right eye: No discharge.        Left eye: No discharge.     Conjunctiva/sclera: Conjunctivae normal.  Cardiovascular:     Rate and Rhythm: Regular rhythm. Tachycardia present.     Pulses: Normal pulses.     Heart sounds: Normal heart sounds.  Pulmonary:     Effort: Pulmonary effort is  normal.     Breath sounds: Normal breath sounds.  Abdominal:     General: Abdomen is flat.     Palpations: Abdomen is soft.  Skin:    General: Skin is warm and dry.  Neurological:     General: No focal deficit present.     Mental Status: She is oriented to person, place, and time. She is confused.     Comments: GCS 15. Speech is goal oriented. No deficits appreciated to CN III-XII; symmetric eyebrow raise, no facial drooping, tongue midline. Patient has equal grip strength bilaterally with 5/5 strength against resistance in all major muscle groups bilaterally. Sensation to light touch intact. Patient moves upper extremities with symmetric ataxia. Dysmetric finger-nose-finger bilaterally. Patient ambulatory with steady gait.   Psychiatric:        Mood and Affect: Mood normal.     ED Results / Procedures / Treatments   Labs (all labs ordered are listed, but only abnormal results are displayed) Labs Reviewed  COMPREHENSIVE METABOLIC PANEL - Abnormal; Notable for the following components:      Result Value   Potassium 2.7 (*)    Glucose, Bld 139 (*)    Creatinine, Ser 1.04 (*)    GFR, Estimated 53 (*)    All other components within normal limits  CBC WITH DIFFERENTIAL/PLATELET - Abnormal; Notable for the following components:   WBC 16.5 (*)    RBC 3.76 (*)    Hemoglobin 10.7 (*)    HCT 33.2 (*)    Neutro Abs 15.1 (*)    Lymphs Abs 0.6 (*)    Abs Immature Granulocytes 0.14 (*)    All other components within normal limits  RESP PANEL BY RT-PCR (RSV, FLU A&B, COVID)  RVPGX2  CULTURE, BLOOD (ROUTINE X 2)  CULTURE, BLOOD (ROUTINE X 2)  URINALYSIS, W/ REFLEX TO CULTURE (INFECTION SUSPECTED)  HEMOGLOBIN A1C  I-STAT CG4 LACTIC ACID, ED    EKG None  Radiology CT Cervical Spine Wo Contrast Result Date: 03/02/2023 CLINICAL DATA:  Poly trauma, blunt.  Fall. EXAM: CT CERVICAL SPINE WITHOUT CONTRAST TECHNIQUE: Multidetector CT imaging of the cervical spine was performed without  intravenous contrast. Multiplanar CT image reconstructions were also generated. RADIATION DOSE REDUCTION: This exam was performed according to the departmental dose-optimization program which includes automated exposure control, adjustment of the mA and/or kV according to patient size and/or use of iterative reconstruction technique. COMPARISON:  CT cervical spine 12/09/2021 FINDINGS: Alignment: Straightening without focal angulation or listhesis. Skull base and vertebrae: No evidence of acute fracture or traumatic subluxation. Multilevel spondylosis with interbody ankylosis at C3-4. The facet joints appear ankylosed on the right from C2 through C4 and on the left at C3-4. Soft tissues and spinal canal: No prevertebral fluid or swelling. No visible canal hematoma. Disc levels: Stable multilevel spondylosis with disc space  narrowing, endplate osteophytes and facet hypertrophy. Chronic spinal stenosis and foraminal narrowing at multiple levels, similar to previous CT. No large disc herniation identified. Upper chest: No acute findings. Other: Bilateral carotid atherosclerosis. IMPRESSION: 1. No evidence of acute cervical spine fracture, traumatic subluxation or static signs of instability. 2. Stable multilevel cervical spondylosis with chronic spinal stenosis and foraminal narrowing at multiple levels. 3. Carotid atherosclerosis. Electronically Signed   By: Carey Bullocks M.D.   On: 03/02/2023 08:33   DG Chest 2 View Result Date: 03/02/2023 CLINICAL DATA:  83 year old female with history of fever and altered mental status. EXAM: CHEST - 2 VIEW COMPARISON:  Chest x-ray 07/05/2022. FINDINGS: Poorly defined opacity in the left lower lobe concerning for developing pneumonia. Trace left pleural effusion. Right lung is clear. No right pleural effusion. No pneumothorax. No evidence of pulmonary edema. Heart size is normal. Upper mediastinal contours are within normal limits. Atherosclerotic calcifications in the thoracic  aorta. IMPRESSION: 1. Left lower lobe pneumonia with small left parapneumonic pleural effusion. Followup PA and lateral chest X-ray is recommended in 3-4 weeks following trial of antibiotic therapy to ensure resolution and exclude underlying malignancy. Electronically Signed   By: Trudie Reed M.D.   On: 03/02/2023 07:07    Procedures .Critical Care  Performed by: Gareth Eagle, PA-C Authorized by: Gareth Eagle, PA-C   Critical care provider statement:    Critical care time (minutes):  30   Critical care was necessary to treat or prevent imminent or life-threatening deterioration of the following conditions:  Sepsis (septic pneumonia with hypokalemia)   Critical care was time spent personally by me on the following activities:  Development of treatment plan with patient or surrogate, discussions with consultants, evaluation of patient's response to treatment, examination of patient, ordering and review of laboratory studies, ordering and review of radiographic studies, ordering and performing treatments and interventions, pulse oximetry, re-evaluation of patient's condition and review of old charts     Medications Ordered in ED Medications  potassium chloride SA (KLOR-CON M) CR tablet 40 mEq (0 mEq Oral Hold 03/02/23 0809)  potassium chloride 10 mEq in 100 mL IVPB (10 mEq Intravenous New Bag/Given 03/02/23 0802)  enoxaparin (LOVENOX) injection 40 mg (has no administration in time range)  insulin aspart (novoLOG) injection 0-15 Units (has no administration in time range)  insulin glargine-yfgn (SEMGLEE) injection 10 Units (has no administration in time range)  acetaminophen (TYLENOL) tablet 650 mg (has no administration in time range)    Or  acetaminophen (TYLENOL) suppository 650 mg (has no administration in time range)  aspirin EC tablet 81 mg (has no administration in time range)  famotidine (PEPCID) tablet 20 mg (has no administration in time range)  pantoprazole (PROTONIX) EC  tablet 40 mg (has no administration in time range)  rosuvastatin (CRESTOR) tablet 10 mg (has no administration in time range)  sertraline (ZOLOFT) tablet 100 mg (has no administration in time range)  irbesartan (AVAPRO) tablet 300 mg (has no administration in time range)  amLODipine (NORVASC) tablet 5 mg (has no administration in time range)  cefTRIAXone (ROCEPHIN) 1 g in sodium chloride 0.9 % 100 mL IVPB (has no administration in time range)  azithromycin (ZITHROMAX) 500 mg in sodium chloride 0.9 % 250 mL IVPB (has no administration in time range)  cefTRIAXone (ROCEPHIN) 1 g in sodium chloride 0.9 % 100 mL IVPB (0 g Intravenous Stopped 03/02/23 0927)  azithromycin (ZITHROMAX) 500 mg in sodium chloride 0.9 % 250 mL IVPB (500 mg Intravenous  New Bag/Given 03/02/23 1610)    ED Course/ Medical Decision Making/ A&P Clinical Course as of 03/02/23 1047  Thu Mar 02, 2023  9604 Urinalysis, w/ Reflex to Culture (Infection Suspected) -Urine, Clean Catch [JR]  0751 RDW: 13.2 [JR]  0816 NEUT#(!): 15.1 [JR]    Clinical Course User Index [JR] Gareth Eagle, PA-C                                 Medical Decision Making Amount and/or Complexity of Data Reviewed Labs: ordered. Decision-making details documented in ED Course. Radiology: ordered.  Risk Prescription drug management.   Initial Impression and Ddx 83 year old well-appearing female presenting for AMS. Exam notable for confusion, temp of 100.1 and tacycardia. Ddx history of CVA, diabetes, hypertension, CKD.  DDx includes stroke, sepsis, pneumonia, UTI, arrhythmia, electrolyte derangement, other. Patient PMH that increases complexity of ED encounter:  history of CVA, diabetes, hypertension, CKD  Interpretation of Diagnostics - I independent reviewed and interpreted the labs as followed: Hypokalemia (2.7), leukocytosis  - I independently visualized the following imaging with scope of interpretation limited to determining acute life  threatening conditions related to emergency care: Chest x-ray, which revealed left lower lobe pneumonia, CT head and cervical spine were negative for acute findings  -EKG is pending  Patient Reassessment and Ultimate Disposition/Management Workup suggestive of sepsis likely secondary to pneumonia.  Urinalysis is pending.  Started on IV ceftriaxone and azithromycin.  Admitted to hospital service with Dr. Merrilyn Puma.   Patient management required discussion with the following services or consulting groups:  Hospitalist Service  Complexity of Problems Addressed Acute complicated illness or Injury  Additional Data Reviewed and Analyzed Further history obtained from: Past medical history and medications listed in the EMR and Prior ED visit notes  Patient Encounter Risk Assessment Consideration of hospitalization         Final Clinical Impression(s) / ED Diagnoses Final diagnoses:  Pneumonia of left lung due to infectious organism, unspecified part of lung  Altered mental status, unspecified altered mental status type    Rx / DC Orders ED Discharge Orders     None         Gareth Eagle, PA-C 03/02/23 1047    Durwin Glaze, MD 03/02/23 [phone]

## 2023-03-02 NOTE — ED Notes (Signed)
1 set of cultures drawn and sent to lab.

## 2023-03-02 NOTE — ED Notes (Signed)
Called and placed on CCMD monitor

## 2023-03-02 NOTE — ED Triage Notes (Signed)
Pt arrives via [facility] from [place] [facility] facility. Staff called out for AMS and slurred speech. Unknown last known normal. Pt had a fall at 1800 last night ( found her upright sitting in the bathroom- non complaints). Night shift noticed that her blood pressure was elevated and speech "slurred to her". Typically ambulatory and independent.For EMS A/O x1. LVO 1. Feels warm to touch and odorous urine. 90% RA- 3 liters applied, rr 2r, 160/100, cbg 149.NSR on the monitor. IV established in the left [facility]. DNR in place. C collar placed for transport.

## 2023-03-02 NOTE — H&P (Addendum)
History and Physical    Patient: [name] MWN:[id number] DOB: [birth year] DOA: 03/02/2023 DOS: the patient was seen and examined on 03/02/2023 PCP: [facility]  Patient coming from: SNF  Chief Complaint: Altered mental status  HPI: [name] is a 83 y.o. female with medical history significant of hypertension, hyperlipidemia, type 2 diabetes, depression, dementia, prior CVA presenting for evaluation after an unwitnessed fall.  Patient is confused and unable to provide history.  Attempted to call [facility] nursing facility but unable to reach x 2.  Attempted to call son, [name], but unable to reach. History primarily obtained from chart review and some history from daughter, [name].  Per daughter, she is the healthcare POA while son is the financial POA.  Patient resides at [facility] nursing facility.  She sustained an unwitnessed fall around 6 PM yesterday evening.  Unclear as to the events that transpired later that night.  This morning, nursing staff became concerned when they noted that patient was confused and was having some slurred speech.  They are unclear when her last normal was.  There were also reports of malodorous urine.  Per staff, she was initially hypoxic this morning and required 3 L of oxygen.  Per daughter, patient does have a history of dementia and has good and bad days.  States that she is usually oriented to self, place, year.  Daughter states that she is only aware that patient was brought to the ED but does not have any additional details.  ED course: Notable vital signs with mild tachycardia initially, since resolved, and blood pressure 158/87.  CBC revealed leukocytosis to 16.5 and chronic anemia around her baseline.  CMP revealed potassium of 2.7, mild hyperglycemia, kidney function around her baseline.  Respiratory panel negative for COVID, flu, RSV.  Lactic acid normal.  Urinalysis showing moderate myoglobinuria, moderate leukocytes,  negative nitrites, 11-20 WBCs, many bacteria.  Urine culture pending.  Chest x-ray revealed a left lower lobe pneumonia with small left parapneumonic pleural effusion.  CT head with no acute intracranial abnormalities and CT cervical spine with no acute cervical spine abnormalities.  [facility] hospitalist was asked to evaluate patient for admission.   Review of Systems: As mentioned in the history of present illness. All other systems reviewed and are negative. Past Medical History:  Diagnosis Date   Allergy history unknown    Anxiety    Cerebrovascular accident (HCC)    some residual left sided weakness   chronic back pain    CKD (chronic kidney disease), stage III (HCC)    Depression    Diabetes mellitus    type 2   Gastroparesis    78% RETENTION AT 120 MIN   GERD (gastroesophageal reflux disease)    Hemorrhoids    Hiatal hernia    Hyperlipidemia    Hypertension    Spinal stenosis of lumbar region    Past Surgical History:  Procedure Laterality Date   ABDOMINAL HYSTERECTOMY  1982   APPENDECTOMY  1982   BREAST BIOPSY  1975   Lt breast, benign tumor   CATARACT EXTRACTION W/PHACO Left 03/12/2018   Procedure: CATARACT EXTRACTION PHACO AND INTRAOCULAR LENS PLACEMENT (IOC) LEFT  DIABETES;  Surgeon: Nevada Crane, MD;  Location: [facility];  Service: Ophthalmology;  Laterality: Left;  Diabetic   eptopic pregancy 1982     TONSILLECTOMY     TUBAL LIGATION  1975   Social History:  reports that she has never smoked. She has never used smokeless tobacco.  She reports that she does not drink alcohol and does not use drugs.  Allergies  Allergen Reactions   Ezetimibe-Simvastatin Other (See Comments)    REACTION: unsure true allergy    Family History  Problem Relation Age of Onset   Cervical cancer Mother    Stomach cancer Mother    Diabetes Sister        x3 sisters   Diabetes Brother        x1 brother   Hypertension Other    Breast cancer Sister        oldest sister    Prostate cancer Father    Prostate cancer Brother    Heart attack Other    Colon cancer Neg Hx     Prior to Admission medications   Medication Sig Start Date End Date Taking? Authorizing Provider  acetaminophen (TYLENOL) 500 MG tablet Take 500 mg by mouth 2 (two) times daily as needed for mild pain (pain score 1-3).   Yes [provider]  aspirin EC 81 MG tablet Take 81 mg by mouth daily.   Yes [provider]  bisacodyl (DULCOLAX) 5 MG EC tablet Take 10 mg by mouth 2 (two) times daily as needed for moderate constipation.   Yes [provider]  famotidine (PEPCID) 20 MG tablet Take 20 mg by mouth every evening. 01/18/23  Yes [provider]  fluticasone (FLONASE) 50 MCG/ACT nasal spray Place 1 spray into both nostrils 2 (two) times daily as needed for allergies. 09/07/22  Yes [provider]  insulin glargine (LANTUS) 100 UNIT/ML injection Inject 25 Units into the skin at bedtime.   Yes [provider]  LANTUS SOLOSTAR 100 UNIT/ML Solostar Pen Inject 15 Units into the skin in the morning. 02/28/23  Yes [provider]  LINZESS 72 MCG capsule Take 72 mcg by mouth every morning. 02/25/23  Yes [provider]  magnesium hydroxide (MILK OF MAGNESIA) 400 MG/5ML suspension Take 30 mLs by mouth daily as needed for mild constipation.   Yes [provider]  metFORMIN (GLUCOPHAGE) 500 MG tablet Take 500 mg by mouth 2 (two) times daily. 02/06/23  Yes [provider]  nystatin cream (MYCOSTATIN) Apply 1 Application topically 2 (two) times daily. 02/11/23  Yes [provider]  Olmesartan-amLODIPine-HCTZ 40-5-12.5 MG TABS Take 1 tablet by mouth in the morning. 09/13/17  Yes [provider]  omeprazole (PRILOSEC) 20 MG capsule Take 20 mg by mouth 2 (two) times daily.   Yes [provider]  ondansetron (ZOFRAN) 4 MG tablet Take 4 mg by mouth daily as needed for nausea. 09/19/22  Yes [provider]  oseltamivir (TAMIFLU) 75 MG capsule Take 75 mg by mouth daily. For 10 days started on 1.28.2025 02/27/23  Yes [provider]  rosuvastatin (CRESTOR) 10 MG tablet  09/15/17  Yes [provider]  sertraline (ZOLOFT) 100 MG tablet  09/15/17  Yes [provider]  potassium chloride (KLOR-CON) 10 MEQ tablet Take 1 tablet (10 mEq total) by mouth 2 (two) times daily for 3 days. Patient not taking: Reported on 03/02/2023 07/16/22 07/19/22  Mardene Sayer, MD    Physical Exam: Vitals:   03/02/23 0606  BP: (!) 158/87  Pulse: (!) 104  Resp: 16  Temp: 100.1 F (37.8 C)  TempSrc: Oral   Physical Exam Constitutional:      Comments: Chronically ill-appearing  HENT:     Head: Normocephalic and atraumatic.     Mouth/Throat:     Mouth:  Mucous membranes are dry.     Pharynx: Oropharynx is clear. No oropharyngeal exudate.  Eyes:     General: No scleral icterus.    Extraocular Movements: Extraocular movements intact.     Conjunctiva/sclera: Conjunctivae normal.     Pupils: Pupils are equal, round, and reactive to light.  Cardiovascular:     Rate and Rhythm: Normal rate and regular rhythm.     Pulses: Normal pulses.     Heart sounds: Normal heart sounds. No murmur heard.    No friction rub. No gallop.  Pulmonary:     Effort: Pulmonary effort is normal.     Breath sounds: Normal breath sounds. No wheezing, rhonchi or rales.  Abdominal:     General: Bowel sounds are normal. There is no distension.     Palpations: Abdomen is soft.     Tenderness: There is no abdominal tenderness. There is no guarding or rebound.  Musculoskeletal:        General: No swelling. Normal range of motion.  Skin:    General: Skin is warm and dry.  Neurological:     Mental Status: She is alert.     Comments: Patient oriented to self, but not to time, place, or situation.  She is able to move all extremities spontaneously and antigravity.  Psychiatric:     Comments:  Pleasantly confused     Data Reviewed:  There are no new results to review at this time.     Latest Ref Rng & Units 03/02/2023    6:10 AM 07/15/2022   11:42 PM 07/05/2022    6:36 PM  CBC  WBC 4.0 - 10.5 K/uL 16.5  10.7  8.4   Hemoglobin 12.0 - 15.0 g/dL 16.1  9.6  09.6   Hematocrit 36.0 - 46.0 % 33.2  29.8  31.0   Platelets 150 - 400 K/uL 194  193  185       Latest Ref Rng & Units 03/02/2023    6:10 AM 07/15/2022   11:42 PM 07/05/2022    7:24 PM  CMP  Glucose 70 - 99 mg/dL 045  409    BUN 8 - 23 mg/dL 19  14    Creatinine 8.11 - 1.00 mg/dL 9.14  7.82    Sodium 956 - 145 mmol/L 139  136    Potassium 3.5 - 5.1 mmol/L 2.7  2.9    Chloride 98 - 111 mmol/L 101  99    CO2 22 - 32 mmol/L 24  28    Calcium 8.9 - 10.3 mg/dL 9.1  8.3    Total Protein 6.5 - 8.1 g/dL 7.2  6.9  7.5   Total Bilirubin 0.0 - 1.2 mg/dL 1.1  1.0  0.6   Alkaline Phos 38 - 126 U/L 49  39  44   AST 15 - 41 U/L 23  17  17    ALT 0 - 44 U/L 22  15  13     Urinalysis    Component Value Date/Time   COLORURINE YELLOW 03/02/2023 0927   APPEARANCEUR HAZY (A) 03/02/2023 0927   LABSPEC 1.016 03/02/2023 0927   PHURINE 5.0 03/02/2023 0927   GLUCOSEU NEGATIVE 03/02/2023 0927   HGBUR MODERATE (A) 03/02/2023 0927   HGBUR negative 07/09/2009 1338   BILIRUBINUR NEGATIVE 03/02/2023 0927   BILIRUBINUR neg 04/30/2010 1325   KETONESUR NEGATIVE 03/02/2023 0927   PROTEINUR NEGATIVE 03/02/2023 0927   UROBILINOGEN 0.2 02/13/2011 1936   NITRITE NEGATIVE 03/02/2023 0927   LEUKOCYTESUR MODERATE (A)  03/02/2023 0927    Lactic Acid, Venous    Component Value Date/Time   LATICACIDVEN 1.4 03/02/2023 1610   CT Cervical Spine Wo Contrast Result Date: 03/02/2023 CLINICAL DATA:  Poly trauma, blunt.  Fall. EXAM: CT CERVICAL SPINE WITHOUT CONTRAST TECHNIQUE: Multidetector CT imaging of the cervical spine was performed without intravenous contrast. Multiplanar CT image reconstructions were also generated. RADIATION DOSE REDUCTION: This  exam was performed according to the departmental dose-optimization program which includes automated exposure control, adjustment of the mA and/or kV according to patient size and/or use of iterative reconstruction technique. COMPARISON:  CT cervical spine 12/09/2021 FINDINGS: Alignment: Straightening without focal angulation or listhesis. Skull base and vertebrae: No evidence of acute fracture or traumatic subluxation. Multilevel spondylosis with interbody ankylosis at C3-4. The facet joints appear ankylosed on the right from C2 through C4 and on the left at C3-4. Soft tissues and spinal canal: No prevertebral fluid or swelling. No visible canal hematoma. Disc levels: Stable multilevel spondylosis with disc space narrowing, endplate osteophytes and facet hypertrophy. Chronic spinal stenosis and foraminal narrowing at multiple levels, similar to previous CT. No large disc herniation identified. Upper chest: No acute findings. Other: Bilateral carotid atherosclerosis. IMPRESSION: 1. No evidence of acute cervical spine fracture, traumatic subluxation or static signs of instability. 2. Stable multilevel cervical spondylosis with chronic spinal stenosis and foraminal narrowing at multiple levels. 3. Carotid atherosclerosis. Electronically Signed   By: Carey Bullocks M.D.   On: 03/02/2023 08:33   DG Chest 2 View Result Date: 03/02/2023 CLINICAL DATA:  83 year old female with history of fever and altered mental status. EXAM: CHEST - 2 VIEW COMPARISON:  Chest x-ray 07/05/2022. FINDINGS: Poorly defined opacity in the left lower lobe concerning for developing pneumonia. Trace left pleural effusion. Right lung is clear. No right pleural effusion. No pneumothorax. No evidence of pulmonary edema. Heart size is normal. Upper mediastinal contours are within normal limits. Atherosclerotic calcifications in the thoracic aorta. IMPRESSION: 1. Left lower lobe pneumonia with small left parapneumonic pleural effusion. Followup PA  and lateral chest X-ray is recommended in 3-4 weeks following trial of antibiotic therapy to ensure resolution and exclude underlying malignancy. Electronically Signed   By: Trudie Reed M.D.   On: 03/02/2023 07:07    Assessment and Plan: No notes have been filed under this hospital service. Service: Hospitalist   Left lower lobe pneumonia Small left parapneumonic pleural effusion Patient is not septic.  Though she did present with mild tachycardia, she has otherwise remained afebrile with normal respiratory rate and no oxygen requirement.  Lab work has not revealed any end-organ damage and she has a normal lactate.  Though she is confused, she has a history of dementia that is likely exacerbated by current pneumonia and UTI. -Continue IV rocephin and azithromycin -Trend WBC curve -Follow-up blood cultures x 2 -Status post 1 L IV fluid -Supplemental oxygen as needed to maintain O2 sats greater than 92% (currently doing well on room air) -Place in observation  UTI Myoglobinuria Reports of malodorous urine at SNF.  UA showing myoglobinuria, moderate leukocytes, 11-20 WBCs, many bacteria.  Urine culture pending.  She is receiving coverage with IV Rocephin for concomitant left lower lobe pneumonia.  Her UTI and pneumonia are likely contributing to her altered mental status. -IV Rocephin as noted above -Follow-up CK -Status post 1 L IV fluid -Follow-up urine culture  Altered mental status History of dementia Difficult to tell what patient's baseline mental status is.  She has a history of documented  dementia but she is not on any medications to help slow progression.  Per daughter, patient is typically oriented to person, place, year though daughter does note that patient has bad days. CT head with no acute intracranial abnormality noted. The etiology of her altered mental status is likely her current infections as noted above, anticipate improvement with treatment. -Management of  pneumonia and UTI as noted above  Hypokalemia Patient with history of hypokalemia dating back to 2 years ago.  On admission, potassium 2.7.  She is unable to swallow large pills and thus unable to take an oral potassium.  Have provided a total of 4 rounds of IV potassium.  She is on hydrochlorothiazide at home, possible contributor. -4 runs of IV potassium -Follow-up potassium and magnesium level tomorrow morning, replete as needed -Holding home hydrochlorothiazide  HTN Patient takes olmesartan-amlodipine-hydrochlorothiazide 40-5-12 0.5 mg daily at home.  Blood pressure elevated to 150s/80s on admission.  Will resume her home olmesartan and amlodipine.  Holding hydrochlorothiazide in the setting of hypokalemia. -Resume home olmesartan 40 mg daily -Resume home Norvasc 5 mg daily -holding hydrochlorothiazide given hypokalemia  History of CVA Hyperlipidemia -Resume home aspirin 81 mg daily -Resume home Crestor 10 mg daily  Dysphagia Noted on chart review.  Obtained formal SLP evaluation and they recommended dysphagia 1 diet. -Dysphagia 1 diet  Type 2 diabetes with hyperglycemia Patient with history of type 2 diabetes.  She is currently taking Lantus 15 units every morning and 25 units nightly along with metformin 500 mg twice daily at home.  Her last A1c on file is from 2012.  CBGs ranging in the 140s to 150s since admission.  I will start her on Semglee 10 units twice daily for now along with moderate SSI and trend CBGs. -Follow-up hemoglobin A1c -Semglee 10 units twice daily -Moderate SSI -Trend CBGs, goal 140-180  Anemia of chronic disease Patient with normocytic anemia dating back to 1 year ago.  She is at her baseline hemoglobin level on admission.  No reports from nursing facility or family of any recent melena or hematochezia. -Trend hemoglobin curve  GERD -Resume home PPI and Pepcid  Depression -Resume home Zoloft 100 mg nightly    Advance Care Planning:   Code Status:  Limited: Do not attempt resuscitation (DNR) -DNR-LIMITED -Do Not Intubate/DNI    Consults: none  Family Communication: no family at bedside  Severity of Illness: The appropriate patient status for this patient is OBSERVATION. Observation status is judged to be reasonable and necessary in order to provide the required intensity of service to ensure the patient's safety. The patient's presenting symptoms, physical exam findings, and initial radiographic and laboratory data in the context of their medical condition is felt to place them at decreased risk for further clinical deterioration. Furthermore, it is anticipated that the patient will be medically stable for discharge from the hospital within 2 midnights of admission.   Portions of this note were generated with Scientist, clinical (histocompatibility and immunogenetics). Dictation errors may occur despite best attempts at proofreading.   Author: Briscoe Burns, MD 03/02/2023 10:37 AM  For on call review [email or web address].

## 2023-03-02 NOTE — Hospital Course (Signed)
Sepsis 2/2 CAP  -on RA  Hypokalemia   Unwitnessed fall AMS

## 2023-03-02 NOTE — ED Notes (Signed)
Pt transported to CT ?

## 2023-03-02 NOTE — ED Notes (Signed)
Pt not comfortable swallowing big pills, holding potassium tablets at this time

## 2023-03-02 NOTE — Evaluation (Signed)
Clinical/Bedside Swallow Evaluation Patient Details  Name: [name] MRN: [id number] Date of Birth: 1940/07/13  Today's Date: 03/02/2023 Time: SLP Start Time (ACUTE ONLY): 1110 SLP Stop Time (ACUTE ONLY): 1125 SLP Time Calculation (min) (ACUTE ONLY): 15 min  Past Medical History:  Past Medical History:  Diagnosis Date   Allergy history unknown    Anxiety    Cerebrovascular accident (HCC)    some residual left sided weakness   chronic back pain    CKD (chronic kidney disease), stage III (HCC)    Depression    Diabetes mellitus    type 2   Gastroparesis    78% RETENTION AT 120 MIN   GERD (gastroesophageal reflux disease)    Hemorrhoids    Hiatal hernia    Hyperlipidemia    Hypertension    Spinal stenosis of lumbar region    Past Surgical History:  Past Surgical History:  Procedure Laterality Date   ABDOMINAL HYSTERECTOMY  1982   APPENDECTOMY  1982   BREAST BIOPSY  1975   Lt breast, benign tumor   CATARACT EXTRACTION W/PHACO Left 03/12/2018   Procedure: CATARACT EXTRACTION PHACO AND INTRAOCULAR LENS PLACEMENT (IOC) LEFT  DIABETES;  Surgeon: Nevada Crane, MD;  Location: [facility];  Service: Ophthalmology;  Laterality: Left;  Diabetic   eptopic pregancy 1982     TONSILLECTOMY     TUBAL LIGATION  1975   HPI:  [name] is an 83 yo female presenting to ED 1/30 from [facility] after a fall with AMS and slurred speech. CXR shows L lower lobe PNA. CTH pending. PMH includes prior CVA, T2DM, HTN, CKD    Assessment / Plan / Recommendation  Clinical Impression  Pt is edentulous and states that she typically wears dentures for all PO intake, although they are not currently available. Observed with trials of thin liquids, purees, and solids without signs clinically concerning for aspiration. Pt had prolonged mastication and oral holding with solids, which is suspected to be secondary to edentulism. For now, recommend initiating a diet of Dys 1 solids with  thin liquids. Pt denies difficulty taking pills but if she cannot swallow large pills, recommend giving them whole in applesauce. SLP will f/u to assess readiness to advance diet as dentures are accessible. SLP Visit Diagnosis: Dysphagia, unspecified (R13.10)    Aspiration Risk  Mild aspiration risk    Diet Recommendation Dysphagia 1 (Puree);Thin liquid    Liquid Administration via: Cup;Straw Medication Administration: Whole meds with puree Supervision: Staff to assist with self feeding;Full supervision/cueing for compensatory strategies Compensations: Slow rate;Small sips/bites;Lingual sweep for clearance of pocketing Postural Changes: Seated upright at 90 degrees    Other  Recommendations Oral Care Recommendations: Oral care BID    Recommendations for follow up therapy are one component of a multi-disciplinary discharge planning process, led by the attending physician.  Recommendations may be updated based on patient status, additional functional criteria and insurance authorization.  Follow up Recommendations Skilled nursing-short term rehab (<3 hours/day)      Assistance Recommended at Discharge    Functional Status Assessment Patient has had a recent decline in their functional status and demonstrates the ability to make significant improvements in function in a reasonable and predictable amount of time.  Frequency and Duration min 2x/week  1 week       Prognosis Prognosis for improved oropharyngeal function: Good Barriers to Reach Goals: Cognitive deficits;Language deficits      Swallow Study   General HPI: [name]  is an 83 yo female presenting to ED 1/30 from [facility] after a fall with AMS and slurred speech. CXR shows L lower lobe PNA. CTH pending. PMH includes prior CVA, T2DM, HTN, CKD Type of Study: Bedside Swallow Evaluation Previous Swallow Assessment: none in chart Diet Prior to this Study: NPO Temperature Spikes Noted: No Respiratory Status: Room  air History of Recent Intubation: No Behavior/Cognition: Alert;Cooperative;Pleasant mood Oral Cavity Assessment: Within Functional Limits Oral Care Completed by SLP: No Oral Cavity - Dentition: Edentulous;Dentures, not available Vision: Functional for self-feeding Self-Feeding Abilities: Total assist Patient Positioning: Upright in bed Baseline Vocal Quality: Normal Volitional Cough: Strong Volitional Swallow: Able to elicit    Oral/Motor/Sensory Function Overall Oral Motor/Sensory Function: Within functional limits   Ice Chips Ice chips: Not tested   Thin Liquid Thin Liquid: Within functional limits Presentation: Straw    Nectar Thick Nectar Thick Liquid: Not tested   Honey Thick Honey Thick Liquid: Not tested   Puree Puree: Within functional limits Presentation: Spoon   Solid     Solid: Impaired Oral Phase Impairments: Impaired mastication      Gwynneth Aliment, M.A., CF-SLP Speech Language Pathology, Acute Rehabilitation Services  Secure Chat preferred [phone]  03/02/2023,11:37 AM

## 2023-03-03 ENCOUNTER — Other Ambulatory Visit: Payer: Self-pay

## 2023-03-03 DIAGNOSIS — N390 Urinary tract infection, site not specified: Secondary | ICD-10-CM | POA: Diagnosis not present

## 2023-03-03 DIAGNOSIS — E1143 Type 2 diabetes mellitus with diabetic autonomic (poly)neuropathy: Secondary | ICD-10-CM | POA: Diagnosis present

## 2023-03-03 DIAGNOSIS — Z79899 Other long term (current) drug therapy: Secondary | ICD-10-CM | POA: Diagnosis not present

## 2023-03-03 DIAGNOSIS — Z7984 Long term (current) use of oral hypoglycemic drugs: Secondary | ICD-10-CM | POA: Diagnosis not present

## 2023-03-03 DIAGNOSIS — W01198A Fall on same level from slipping, tripping and stumbling with subsequent striking against other object, initial encounter: Secondary | ICD-10-CM | POA: Diagnosis present

## 2023-03-03 DIAGNOSIS — E876 Hypokalemia: Secondary | ICD-10-CM | POA: Diagnosis present

## 2023-03-03 DIAGNOSIS — G9341 Metabolic encephalopathy: Secondary | ICD-10-CM | POA: Diagnosis present

## 2023-03-03 DIAGNOSIS — Z794 Long term (current) use of insulin: Secondary | ICD-10-CM | POA: Diagnosis not present

## 2023-03-03 DIAGNOSIS — J918 Pleural effusion in other conditions classified elsewhere: Secondary | ICD-10-CM | POA: Diagnosis present

## 2023-03-03 DIAGNOSIS — J189 Pneumonia, unspecified organism: Secondary | ICD-10-CM | POA: Diagnosis present

## 2023-03-03 DIAGNOSIS — F0393 Unspecified dementia, unspecified severity, with mood disturbance: Secondary | ICD-10-CM | POA: Diagnosis present

## 2023-03-03 DIAGNOSIS — K3184 Gastroparesis: Secondary | ICD-10-CM | POA: Diagnosis present

## 2023-03-03 DIAGNOSIS — F32A Depression, unspecified: Secondary | ICD-10-CM | POA: Diagnosis present

## 2023-03-03 DIAGNOSIS — E1165 Type 2 diabetes mellitus with hyperglycemia: Secondary | ICD-10-CM | POA: Diagnosis present

## 2023-03-03 DIAGNOSIS — E119 Type 2 diabetes mellitus without complications: Secondary | ICD-10-CM | POA: Diagnosis not present

## 2023-03-03 DIAGNOSIS — Z833 Family history of diabetes mellitus: Secondary | ICD-10-CM | POA: Diagnosis not present

## 2023-03-03 DIAGNOSIS — K219 Gastro-esophageal reflux disease without esophagitis: Secondary | ICD-10-CM | POA: Diagnosis present

## 2023-03-03 DIAGNOSIS — D649 Anemia, unspecified: Secondary | ICD-10-CM | POA: Diagnosis present

## 2023-03-03 DIAGNOSIS — R4182 Altered mental status, unspecified: Secondary | ICD-10-CM | POA: Diagnosis not present

## 2023-03-03 DIAGNOSIS — Z8249 Family history of ischemic heart disease and other diseases of the circulatory system: Secondary | ICD-10-CM | POA: Diagnosis not present

## 2023-03-03 DIAGNOSIS — E785 Hyperlipidemia, unspecified: Secondary | ICD-10-CM | POA: Diagnosis present

## 2023-03-03 DIAGNOSIS — R1310 Dysphagia, unspecified: Secondary | ICD-10-CM | POA: Diagnosis present

## 2023-03-03 DIAGNOSIS — J690 Pneumonitis due to inhalation of food and vomit: Secondary | ICD-10-CM | POA: Diagnosis present

## 2023-03-03 DIAGNOSIS — I10 Essential (primary) hypertension: Secondary | ICD-10-CM | POA: Diagnosis present

## 2023-03-03 DIAGNOSIS — Z1152 Encounter for screening for COVID-19: Secondary | ICD-10-CM | POA: Diagnosis not present

## 2023-03-03 DIAGNOSIS — Z66 Do not resuscitate: Secondary | ICD-10-CM | POA: Diagnosis present

## 2023-03-03 DIAGNOSIS — F0390 Unspecified dementia without behavioral disturbance: Secondary | ICD-10-CM | POA: Diagnosis not present

## 2023-03-03 DIAGNOSIS — J159 Unspecified bacterial pneumonia: Secondary | ICD-10-CM | POA: Diagnosis present

## 2023-03-03 DIAGNOSIS — R821 Myoglobinuria: Secondary | ICD-10-CM | POA: Diagnosis present

## 2023-03-03 LAB — CBC
HCT: 30.8 % — ABNORMAL LOW (ref 36.0–46.0)
Hemoglobin: 9.9 g/dL — ABNORMAL LOW (ref 12.0–15.0)
MCH: 28.4 pg (ref 26.0–34.0)
MCHC: 32.1 g/dL (ref 30.0–36.0)
MCV: 88.5 fL (ref 80.0–100.0)
Platelets: 164 10*3/uL (ref 150–400)
RBC: 3.48 MIL/uL — ABNORMAL LOW (ref 3.87–5.11)
RDW: 13.2 % (ref 11.5–15.5)
WBC: 10.5 10*3/uL (ref 4.0–10.5)
nRBC: 0 % (ref 0.0–0.2)

## 2023-03-03 LAB — MAGNESIUM: Magnesium: 1.1 mg/dL — ABNORMAL LOW (ref 1.7–2.4)

## 2023-03-03 LAB — BASIC METABOLIC PANEL
Anion gap: 6 (ref 5–15)
BUN: 13 mg/dL (ref 8–23)
CO2: 25 mmol/L (ref 22–32)
Calcium: 8.2 mg/dL — ABNORMAL LOW (ref 8.9–10.3)
Chloride: 104 mmol/L (ref 98–111)
Creatinine, Ser: 0.9 mg/dL (ref 0.44–1.00)
GFR, Estimated: >60 mL/min (ref >60)
Glucose, Bld: 231 mg/dL — ABNORMAL HIGH (ref 70–99)
Potassium: 3 mmol/L — ABNORMAL LOW (ref 3.5–5.1)
Sodium: 135 mmol/L (ref 135–145)

## 2023-03-03 LAB — CBG MONITORING, ED
Glucose-Capillary: 199 mg/dL — ABNORMAL HIGH (ref 70–99)
Glucose-Capillary: 142 mg/dL — ABNORMAL HIGH (ref 70–99)

## 2023-03-03 LAB — GLUCOSE, CAPILLARY
Glucose-Capillary: 140 mg/dL — ABNORMAL HIGH (ref 70–99)
Glucose-Capillary: 236 mg/dL — ABNORMAL HIGH (ref 70–99)

## 2023-03-03 MED ORDER — POTASSIUM CHLORIDE CRYS ER 20 MEQ PO TBCR
40.0000 meq | EXTENDED_RELEASE_TABLET | Freq: Three times a day (TID) | ORAL | Status: AC
  Administered 2023-03-03: 40 meq via ORAL
  Filled 2023-03-03 (×4): qty 2

## 2023-03-03 MED ORDER — MAGNESIUM SULFATE 4 GM/100ML IV SOLN
4.0000 g | Freq: Once | INTRAVENOUS | Status: AC
  Administered 2023-03-03: 4 g via INTRAVENOUS
  Filled 2023-03-03: qty 100

## 2023-03-03 MED ORDER — POTASSIUM CHLORIDE CRYS ER 20 MEQ PO TBCR
40.0000 meq | EXTENDED_RELEASE_TABLET | Freq: Once | ORAL | Status: AC
  Administered 2023-03-03: 40 meq via ORAL
  Filled 2023-03-03: qty 2

## 2023-03-03 MED ORDER — OXYCODONE HCL 5 MG PO TABS
5.0000 mg | ORAL_TABLET | Freq: Four times a day (QID) | ORAL | Status: DC | PRN
  Administered 2023-03-03 – 2023-03-04 (×3): 5 mg via ORAL
  Filled 2023-03-03 (×3): qty 1

## 2023-03-03 NOTE — ED Notes (Signed)
Pt c/o headache, tylenol given.

## 2023-03-03 NOTE — Progress Notes (Signed)
.  Transition of Care [facility]) - Inpatient Brief Assessment   Patient Details  Name: [name] MRN: [id number] Date of Birth: 1941/01/20  [facility] CM/SW Contact:    Oletta Cohn, RN Phone Number: 03/03/2023, 9:28 AM   Clinical Narrative: Pt in observation for community acquired pneumonia.  From [facility] [place]; plans to return.       Transition of Care Asessment: Insurance and Status: (P) Insurance coverage has been reviewed Patient has primary care physician: (P) Yes Home environment has been reviewed: (P) From [facility] [place] Prior level of function:: (P) walks with walker, otherwise indepenent Prior/Current Home Services: (P) No current home services Social Drivers of Health Review: (P) SDOH reviewed no interventions necessary Readmission risk has been reviewed: (P) Yes Transition of care needs: (P) transition of care needs identified, TOC will continue to follow

## 2023-03-03 NOTE — Evaluation (Signed)
Speech Language Pathology Evaluation Patient Details Name: [name] MRN: [id number] DOB: 06/22/1940 Today's Date: 03/03/2023 Time: 4098-1191 SLP Time Calculation (min) (ACUTE ONLY): 12 min  Problem List:  Patient Active Problem List   Diagnosis Date Noted   Pneumonia 03/03/2023   CAP (community acquired pneumonia) 03/02/2023   Pain due to onychomycosis of toenails of both feet 11/26/2020   Diabetes mellitus without complication (HCC) 11/26/2020   Chest pain, non-cardiac 05/17/2010   Constipation 05/17/2010   POLYARTHRITIS 04/05/2010   Asymptomatic postmenopausal status 02/15/2010   TINEA CORPORIS 10/20/2009   VAGINAL DISCHARGE 08/14/2009   GASTROPARESIS 06/30/2009   WEAKNESS 06/08/2009   FALL, HX OF 03/26/2009   NECK MASS 02/20/2009   CHRONIC PAIN SYNDROME 07/11/2008   DYSPHAGIA 06/23/2008   DEPRESSION 06/02/2008   Pain in limb 05/06/2008   HYPOKALEMIA 04/22/2008   MYALGIA 04/07/2008   HIP PAIN, RIGHT 01/08/2008   VAGINITIS 08/24/2007   Urinary tract infection, site not specified 07/11/2007   SPINAL STENOSIS, LUMBAR 06/13/2007   BACK PAIN, LUMBAR, WITH RADICULOPATHY 06/13/2007   Anemia, unspecified 05/02/2007   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   Dizziness and giddiness 12/20/2006   OTHER ACUTE SINUSITIS 12/06/2006   INSOMNIA 11/29/2006   GERD 10/27/2006   HIATAL HERNIA 10/27/2006   MOLE 09/07/2006   ANXIETY STATE NOS 08/10/2006   DIABETES MELLITUS, TYPE II 02/09/2006   HYPERLIPIDEMIA 02/09/2006   DEMENTIA 02/09/2006   HYPERTENSION 02/09/2006   CEREBROVASCULAR ACCIDENT 02/09/2006   INTERNAL HEMORRHOIDS 01/26/2006   Past Medical History:  Past Medical History:  Diagnosis Date   Allergy history unknown    Anxiety    Cerebrovascular accident (HCC)    some residual left sided weakness   chronic back pain    CKD (chronic kidney disease), stage III (HCC)    Depression    Diabetes mellitus    type 2   Gastroparesis    78% RETENTION AT 120 MIN   GERD  (gastroesophageal reflux disease)    Hemorrhoids    Hiatal hernia    Hyperlipidemia    Hypertension    Spinal stenosis of lumbar region    Past Surgical History:  Past Surgical History:  Procedure Laterality Date   ABDOMINAL HYSTERECTOMY  1982   APPENDECTOMY  1982   BREAST BIOPSY  1975   Lt breast, benign tumor   CATARACT EXTRACTION W/PHACO Left 03/12/2018   Procedure: CATARACT EXTRACTION PHACO AND INTRAOCULAR LENS PLACEMENT (IOC) LEFT  DIABETES;  Surgeon: Nevada Crane, MD;  Location: [facility];  Service: Ophthalmology;  Laterality: Left;  Diabetic   eptopic pregancy 1982     TONSILLECTOMY     TUBAL LIGATION  1975   HPI:  [name] is an 83 yo female presenting to ED 1/30 from [facility] after a fall with AMS and slurred speech. CXR shows L lower lobe PNA. CTH negative for acute abnormality. 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 PMH includes prior CVA, T2DM, HTN, CKD   Assessment / Plan / Recommendation Clinical Impression  Pt's daughter in law present throughout session and reports resolution of acute changes in memory. She states that pt has baseline deficits s/p previous CVA. Pt's speech is halting and groping-like at times. Her working memory and attention appear impaired as she is frequently unable to coherently complete her intended message. Intelligibility is mildly reduced and suspected to be secondary to edentulism. Overall, feel that pt is presenting near her baseline with pt and her daughter  in law in agreement. No further SLP f/u is needed at this time. Recommend she return to familiar environment with f/u PRN.    SLP Assessment  SLP Recommendation/Assessment: All further Speech Lanaguage Pathology  needs can be addressed in the next venue of care SLP Visit Diagnosis: Dysarthria and anarthria (R47.1);Cognitive communication deficit (R41.841)    Recommendations for follow up therapy are one component of  a multi-disciplinary discharge planning process, led by the attending physician.  Recommendations may be updated based on patient status, additional functional criteria and insurance authorization.    Follow Up Recommendations  Skilled nursing-short term rehab (<3 hours/day)    Assistance Recommended at Discharge  PRN  Functional Status Assessment Patient has not had a recent decline in their functional status  Frequency and Duration           SLP Evaluation Cognition  Overall Cognitive Status: History of cognitive impairments - at baseline Arousal/Alertness: Awake/alert Orientation Level: Oriented to person;Oriented to place;Oriented to situation;Disoriented to time Attention: Selective Selective Attention: Impaired Selective Attention Impairment: Verbal basic Memory: Impaired Memory Impairment: Decreased recall of new information Awareness: Appears intact Problem Solving: Appears intact       Comprehension  Auditory Comprehension Overall Auditory Comprehension: Appears within functional limits for tasks assessed    Expression Expression Primary Mode of Expression: Verbal Verbal Expression Overall Verbal Expression: Appears within functional limits for tasks assessed   Oral / Motor  Oral Motor/Sensory Function Overall Oral Motor/Sensory Function: Within functional limits Motor Speech Overall Motor Speech: Impaired Respiration: Within functional limits Phonation: Normal Resonance: Within functional limits Articulation: Within functional limitis Intelligibility: Intelligibility reduced Conversation: 75-100% accurate Motor Planning: Impaired Level of Impairment: Press photographer Errors: Groping for words            Gwynneth Aliment, M.A., CF-SLP Speech Language Pathology, Acute Rehabilitation Services  Secure Chat preferred [phone]  03/03/2023, 4:54 PM

## 2023-03-03 NOTE — Progress Notes (Signed)
PROGRESS NOTE    TANASHA MENEES  WGN:[id number] DOB: 1940-08-12 DOA: 03/02/2023 PCP: [facility]    Brief Narrative:  83 year old from long-term nursing home with history of hypertension, hyperlipidemia, type 2 diabetes, prior stroke who fell in the bathroom and was brought to the ER.  Patient denies any symptoms other than losing balance and hitting her head on the toilet paper roll.  She was brought to the ER.  Skeletal survey was negative.  Blood pressures were adequate.  COVID flu and RSV negative.  Lactic acid normal.  Urinalysis with some abnormality.  Chest x-ray with left lower lobe pneumonia with small parapneumonic effusion.  Admitted due to pneumonia.  Subjective: Patient seen and examined in the emergency room.  She is very talkative.  She has slight dysarthria, however she is able to give most of the information.  Patient tells me about her life history and her family.  She denies any cough or fever.  She denies any dysuria. She does still have some headache on the right side of the head where she bumped into toilet paper roll stand.   Assessment & Plan:   Left lower lobe pneumonia: Agree with admission given severity of symptoms. Antibiotics to treat bacterial pneumonia, will continue Rocephin and azithromycin today. Chest physiotherapy, incentive spirometry, deep breathing exercises, sputum induction, mucolytic's and bronchodilators. Sputum cultures, blood cultures, Legionella and streptococcal antigen. Supplemental oxygen to keep saturations more than 90%. Mobilize with PT OT today.  Suspected UTI: Follow-up urine cultures.  Asymptomatic.  Already on antibiotics with Rocephin.  Will follow-up cultures.  Electrolytes Severe hypokalemia, potassium 2.7 Severe hypomagnesemia, magnesium 1.1 Holding hydrochlorothiazide.  Replace aggressively.  Mag rider 4 g today.  Recheck levels.  Essential hypertension blood pressures stable.  Takes olmesartan amlodipine  hydrochlorothiazide at home.  Resume olmesartan and amlodipine.  Will resume hydrochlorothiazide once potassium is improved.  History of stroke, hyperlipidemia, dysphagia, type 2 diabetes with hyperglycemia: Patient already optimized on aspirin, Crestor, dysphagia 1 diet, insulin.  Continue all.   DVT prophylaxis: enoxaparin (LOVENOX) injection 40 mg Start: 03/02/23 1045   Code Status: DNR with limited intervention, documentation present in the chart Family Communication: None at the bedside Disposition Plan: Status is: Observation The patient will require care spanning > 2 midnights and should be moved to inpatient because: IV antibiotics     Consultants:  None  Procedures:  None  Antimicrobials:  Rocephin and azithromycin 1/30---     Objective: Vitals:   03/03/23 0645 03/03/23 0801 03/03/23 1000 03/03/23 1030  BP: (!) 154/85 118/87 (!) 161/96 (!) 157/86  Pulse: 77 77 78 79  Resp: 17 19 19  (!) 22  Temp:  98.6 F (37 C)    TempSrc:      SpO2: 94% 98% 97% 96%    Intake/Output Summary (Last 24 hours) at 03/03/2023 1136 Last data filed at 03/02/2023 2346 Gross per 24 hour  Intake 1453.24 ml  Output --  Net 1453.24 ml   There were no vitals filed for this visit.  Examination:  General exam: Appears calm and comfortable  Respiratory system: Clear to auscultation. Respiratory effort normal.  Currently on room air. Cardiovascular system: S1 & S2 heard, RRR. Gastrointestinal system: Soft.  Nontender.  Bowel sound present. Central nervous system: Alert and awake.  She is mostly oriented.  She can move all extremities.  She has slight dysarthria.      Data Reviewed: I have personally reviewed following labs and imaging studies  CBC: Recent Labs  Lab 03/02/23 0610 03/03/23 0527  WBC 16.5* 10.5  NEUTROABS 15.1*  --   HGB 10.7* 9.9*  HCT 33.2* 30.8*  MCV 88.3 88.5  PLT 194 164   Basic Metabolic Panel: Recent Labs  Lab 03/02/23 0610 03/03/23 0527  NA 139  135  K 2.7* 3.0*  CL 101 104  CO2 24 25  GLUCOSE 139* 231*  BUN 19 13  CREATININE 1.04* 0.90  CALCIUM 9.1 8.2*  MG  --  1.1*   GFR: CrCl cannot be calculated (Unknown ideal weight.). Liver Function Tests: Recent Labs  Lab 03/02/23 0610  AST 23  ALT 22  ALKPHOS 49  BILITOT 1.1  PROT 7.2  ALBUMIN 3.5   No results for input(s): "LIPASE", "AMYLASE" in the last 168 hours. No results for input(s): "AMMONIA" in the last 168 hours. Coagulation Profile: No results for input(s): "INR", "PROTIME" in the last 168 hours. Cardiac Enzymes: Recent Labs  Lab 03/02/23 2139  CKTOTAL 245*   BNP (last 3 results) No results for input(s): "PROBNP" in the last 8760 hours. HbA1C: Recent Labs    03/02/23 2139  HGBA1C 6.5*   CBG: Recent Labs  Lab 03/02/23 1200 03/02/23 1409 03/02/23 2000 03/03/23 0807  GLUCAP 146* 151* 167* 199*   Lipid Profile: No results for input(s): "CHOL", "HDL", "LDLCALC", "TRIG", "CHOLHDL", "LDLDIRECT" in the last 72 hours. Thyroid Function Tests: No results for input(s): "TSH", "T4TOTAL", "FREET4", "T3FREE", "THYROIDAB" in the last 72 hours. Anemia Panel: No results for input(s): "VITAMINB12", "FOLATE", "FERRITIN", "TIBC", "IRON", "RETICCTPCT" in the last 72 hours. Sepsis Labs: Recent Labs  Lab 03/02/23 4098  LATICACIDVEN 1.4    Recent Results (from the past 240 hours)  Resp panel by RT-PCR (RSV, Flu A&B, Covid) Anterior Nasal Swab     Status: None   Collection Time: 03/02/23  6:23 AM   Specimen: Anterior Nasal Swab  Result Value Ref Range Status   SARS Coronavirus 2 by RT PCR NEGATIVE NEGATIVE Final   Influenza A by PCR NEGATIVE NEGATIVE Final   Influenza B by PCR NEGATIVE NEGATIVE Final    Comment: (NOTE) The Xpert Xpress SARS-CoV-2/FLU/RSV plus assay is intended as an aid in the diagnosis of influenza from Nasopharyngeal swab specimens and should not be used as a sole basis for treatment. Nasal washings and aspirates are unacceptable for  Xpert Xpress SARS-CoV-2/FLU/RSV testing.  Fact Sheet for Patients: BloggerCourse.com  Fact Sheet for Healthcare Providers: SeriousBroker.it  This test is not yet approved or cleared by the [place] FDA and has been authorized for detection and/or diagnosis of SARS-CoV-2 by FDA under an Emergency Use Authorization (EUA). This EUA will remain in effect (meaning this test can be used) for the duration of the COVID-19 declaration under Section 564(b)(1) of the Act, 21 U.S.C. section 360bbb-3(b)(1), unless the authorization is terminated or revoked.     Resp Syncytial Virus by PCR NEGATIVE NEGATIVE Final    Comment: (NOTE) Fact Sheet for Patients: BloggerCourse.com  Fact Sheet for Healthcare Providers: SeriousBroker.it  This test is not yet approved or cleared by the [place] FDA and has been authorized for detection and/or diagnosis of SARS-CoV-2 by FDA under an Emergency Use Authorization (EUA). This EUA will remain in effect (meaning this test can be used) for the duration of the COVID-19 declaration under Section 564(b)(1) of the Act, 21 U.S.C. section 360bbb-3(b)(1), unless the authorization is terminated or revoked.  Performed at [facility], [address] [place], [place] [address]   Urine Culture  Status: Abnormal (Preliminary result)   Collection Time: 03/02/23  9:27 AM   Specimen: Urine, Random  Result Value Ref Range Status   Specimen Description URINE, RANDOM  Final   Special Requests NONE Reflexed from [id number]  Final   Culture (A)  Final    >=100,000 COLONIES/mL ENTEROBACTER SPECIES SUSCEPTIBILITIES TO FOLLOW Performed at [facility], [address], [place], [place] [address]    Report Status PENDING  Incomplete  Culture, blood (Routine X 2) w Reflex to ID Panel     Status: None (Preliminary result)   Collection Time: 03/02/23  9:39  PM   Specimen: BLOOD RIGHT HAND  Result Value Ref Range Status   Specimen Description BLOOD RIGHT HAND  Final   Special Requests   Final    BOTTLES DRAWN AEROBIC AND ANAEROBIC Blood Culture adequate volume   Culture   Final    NO GROWTH < 12 HOURS Performed at [facility], [address], [place], [place] [address]    Report Status PENDING  Incomplete  Culture, blood (Routine X 2) w Reflex to ID Panel     Status: None (Preliminary result)   Collection Time: 03/02/23  9:40 PM   Specimen: BLOOD LEFT HAND  Result Value Ref Range Status   Specimen Description BLOOD LEFT HAND  Final   Special Requests   Final    BOTTLES DRAWN AEROBIC AND ANAEROBIC Blood Culture results may not be optimal due to an inadequate volume of blood received in culture bottles   Culture   Final    NO GROWTH < 12 HOURS Performed at [facility], [address] [place], [place] [address]    Report Status PENDING  Incomplete         Radiology Studies: CT Head Wo Contrast Result Date: 03/02/2023 CLINICAL DATA:  Blunt trauma.  Fall. EXAM: CT HEAD WITHOUT CONTRAST TECHNIQUE: Contiguous axial images were obtained from the base of the skull through the vertex without intravenous contrast. RADIATION DOSE REDUCTION: This exam was performed according to the departmental dose-optimization program which includes automated exposure control, adjustment of the mA and/or kV according to patient size and/or use of iterative reconstruction technique. COMPARISON:  07/16/2022 FINDINGS: Brain: No evidence of acute infarction, hemorrhage, hydrocephalus, extra-axial collection or mass lesion/mass effect. There is mild diffuse low-attenuation within the subcortical and periventricular white matter compatible with chronic microvascular disease. Remote lacunar infarct noted in the left cerebellar hemisphere, unchanged. Vascular: No hyperdense vessel or unexpected calcification. Skull: Normal. Negative for fracture or focal  lesion. Sinuses/Orbits: No acute finding. Other: None IMPRESSION: 1. No acute intracranial abnormalities. 2. Chronic microvascular disease and remote lacunar infarct in the left cerebellar hemisphere. Electronically Signed   By: Signa Kell M.D.   On: 03/02/2023 08:38   CT Cervical Spine Wo Contrast Result Date: 03/02/2023 CLINICAL DATA:  Poly trauma, blunt.  Fall. EXAM: CT CERVICAL SPINE WITHOUT CONTRAST TECHNIQUE: Multidetector CT imaging of the cervical spine was performed without intravenous contrast. Multiplanar CT image reconstructions were also generated. RADIATION DOSE REDUCTION: This exam was performed according to the departmental dose-optimization program which includes automated exposure control, adjustment of the mA and/or kV according to patient size and/or use of iterative reconstruction technique. COMPARISON:  CT cervical spine 12/09/2021 FINDINGS: Alignment: Straightening without focal angulation or listhesis. Skull base and vertebrae: No evidence of acute fracture or traumatic subluxation. Multilevel spondylosis with interbody ankylosis at C3-4. The facet joints appear ankylosed on the right from C2 through  C4 and on the left at C3-4. Soft tissues and spinal canal: No prevertebral fluid or swelling. No visible canal hematoma. Disc levels: Stable multilevel spondylosis with disc space narrowing, endplate osteophytes and facet hypertrophy. Chronic spinal stenosis and foraminal narrowing at multiple levels, similar to previous CT. No large disc herniation identified. Upper chest: No acute findings. Other: Bilateral carotid atherosclerosis. IMPRESSION: 1. No evidence of acute cervical spine fracture, traumatic subluxation or static signs of instability. 2. Stable multilevel cervical spondylosis with chronic spinal stenosis and foraminal narrowing at multiple levels. 3. Carotid atherosclerosis. Electronically Signed   By: Carey Bullocks M.D.   On: 03/02/2023 08:33   DG Chest 2 View Result  Date: 03/02/2023 CLINICAL DATA:  83 year old female with history of fever and altered mental status. EXAM: CHEST - 2 VIEW COMPARISON:  Chest x-ray 07/05/2022. FINDINGS: Poorly defined opacity in the left lower lobe concerning for developing pneumonia. Trace left pleural effusion. Right lung is clear. No right pleural effusion. No pneumothorax. No evidence of pulmonary edema. Heart size is normal. Upper mediastinal contours are within normal limits. Atherosclerotic calcifications in the thoracic aorta. IMPRESSION: 1. Left lower lobe pneumonia with small left parapneumonic pleural effusion. Followup PA and lateral chest X-ray is recommended in 3-4 weeks following trial of antibiotic therapy to ensure resolution and exclude underlying malignancy. Electronically Signed   By: Trudie Reed M.D.   On: 03/02/2023 07:07        Scheduled Meds:  amLODipine  5 mg Oral Daily   aspirin EC  81 mg Oral Daily   enoxaparin (LOVENOX) injection  40 mg Subcutaneous Q24H   famotidine  20 mg Oral QPM   insulin aspart  0-15 Units Subcutaneous TID WC   insulin glargine-yfgn  10 Units Subcutaneous BID   irbesartan  300 mg Oral Daily   pantoprazole  40 mg Oral Daily   rosuvastatin  10 mg Oral Daily   sertraline  100 mg Oral QHS   Continuous Infusions:  azithromycin     cefTRIAXone (ROCEPHIN)  IV       LOS: 0 days    Time spent: 40 minutes    Dorcas Carrow, MD [facility]

## 2023-03-03 NOTE — ED Notes (Signed)
Entered room to medicate pt and pt was not present.

## 2023-03-03 NOTE — ED Notes (Signed)
No report received.  Chart reviewed prior to patient arriving on unit

## 2023-03-03 NOTE — Progress Notes (Signed)
Speech Language Pathology Treatment: Dysphagia  Patient Details Name: [name] MRN: [id number] DOB: Oct 22, 1940 Today's Date: 03/03/2023 Time: 4098-1191 SLP Time Calculation (min) (ACUTE ONLY): 12 min  Assessment / Plan / Recommendation Clinical Impression  Pt's mentation and participation are significantly improved this date. Her dentures are not currently available, but her mastication of regular solids was only mildly prolonged. She effectively cleared her oral cavity this date without noted oral residue. Straw sips of thin liquids observed without s/s of aspiration. Pt eager for diet advancement. Recommend upgrading to Dys 3 solids and continuing thin liquids. If dentures become available, feel pt can upgrade to regular texture solids but ongoing SLP f/u is not necessary at this time.     HPI HPI: [name] is an 83 yo female presenting to ED 1/30 from [facility] after a fall with AMS and slurred speech. CXR shows L lower lobe PNA. CTH negative for acute abnormality. 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 PMH includes prior CVA, T2DM, HTN, CKD      SLP Plan  Continue with current plan of care  All further Speech Lanaguage Pathology  needs can be addressed in the next venue of care   Recommendations for follow up therapy are one component of a multi-disciplinary discharge planning process, led by the attending physician.  Recommendations may be updated based on patient status, additional functional criteria and insurance authorization.    Recommendations  Diet recommendations: Dysphagia 3 (mechanical soft);Thin liquid Liquids provided via: Cup;Straw Medication Administration: Whole meds with liquid Supervision: Patient able to self feed Compensations: Slow rate;Small sips/bites;Lingual sweep for clearance of pocketing Postural Changes and/or Swallow Maneuvers: Seated upright 90 degrees                  Oral care  BID   None Dysphagia, unspecified (R13.10)     Continue with current plan of care     Gwynneth Aliment, M.A., CF-SLP Speech Language Pathology, Acute Rehabilitation Services  Secure Chat preferred [phone]   03/03/2023, 4:56 PM

## 2023-03-03 NOTE — Evaluation (Signed)
Occupational Therapy Evaluation Patient Details Name: [name] MRN: [id number] DOB: 1940/07/21 Today's Date: 03/03/2023   History of Present Illness 83 year old from nursing home admitted 1/30 who fell in the bathroom and was brought to the ER.  Patient denies any symptoms other than losing balance and hitting her head on the toilet paper roll. Chest x-ray with left lower lobe pneumonia with small parapneumonic effusion.Marland Kitchen  PMH: hypertension, hyperlipidemia, type 2 diabetes, prior stroke   Clinical Impression   Patient admitted for the diagnosis above.  PTA the patient resides at a local SNF, agree with PT, patient may just be a LTC resident.  Patient needs Mod to Max A for bed mobility, and is unable to achieve a full stand.  OT can follow in the acute setting to address deficits, recommend return to facility and prior level of supports.         If plan is discharge home, recommend the following: A lot of help with bathing/dressing/bathroom;Two people to help with walking and/or transfers;Direct supervision/assist for medications management    Functional Status Assessment  Patient has had a recent decline in their functional status and demonstrates the ability to make significant improvements in function in a reasonable and predictable amount of time.  Equipment Recommendations  None recommended by OT    Recommendations for Other Services       Precautions / Restrictions Precautions Precautions: Fall Restrictions Weight Bearing Restrictions Per Provider Order: No      Mobility Bed Mobility Overal bed mobility: Needs Assistance Bed Mobility: Sidelying to Sit, Sit to Sidelying   Sidelying to sit: Mod assist, Max assist     Sit to sidelying: Max assist      Transfers Overall transfer level: Needs assistance Equipment used: 2 person hand held assist Transfers: Sit to/from Stand Sit to Stand: Total assist, From elevated surface           General transfer  comment: Patient unable to achieve a full stand.  Patient stated she has spinal stenosis, limits mobility      Balance Overall balance assessment: Needs assistance Sitting-balance support: Feet supported Sitting balance-Leahy Scale: Fair     Standing balance support: Bilateral upper extremity supported Standing balance-Leahy Scale: Zero                             ADL either performed or assessed with clinical judgement   ADL       Grooming: Wash/dry hands;Wash/dry face;Set up;Bed level   Upper Body Bathing: Minimal assistance;Bed level       Upper Body Dressing : Bed level       Toilet Transfer: +2 for physical assistance;Moderate assistance                   Vision Baseline Vision/History: 1 Wears glasses Patient Visual Report: No change from baseline       Perception Perception: Not tested       Praxis Praxis: Not tested       Pertinent Vitals/Pain Pain Assessment Pain Assessment: No/denies pain Pain Intervention(s): Monitored during session     Extremity/Trunk Assessment Upper Extremity Assessment Upper Extremity Assessment: Generalized weakness   Lower Extremity Assessment Lower Extremity Assessment: Defer to PT evaluation   Cervical / Trunk Assessment Cervical / Trunk Assessment: Kyphotic   Communication Communication Communication: No apparent difficulties   Cognition Arousal: Alert Behavior During Therapy: WFL for tasks assessed/performed Overall Cognitive Status: No family/caregiver present to  determine baseline cognitive functioning                   Orientation Level: Disoriented to, Time, Situation     Following Commands: Follows one step commands with increased time Safety/Judgement: Decreased awareness of safety   Problem Solving: Slow processing, Decreased initiation, Difficulty sequencing, Requires verbal cues       General Comments  VSS    Exercises     Shoulder Instructions      Home  Living Family/patient expects to be discharged to:: Skilled nursing facility                                 Additional Comments: ? long term resident?      Prior Functioning/Environment Prior Level of Function : Needs assist             Mobility Comments: Pt reports she could transfer to and from wheelchair and used RW wtih assist ADLs Comments: Had assist as needed for lower body ADL and hygiene.  Able to feed herself and participate with upper body ADL and transfers.        OT Problem List: Decreased strength;Decreased activity tolerance;Impaired balance (sitting and/or standing);Decreased safety awareness      OT Treatment/Interventions: Self-care/ADL training;Therapeutic activities;Balance training;DME and/or AE instruction;Patient/family education    OT Goals(Current goals can be found in the care plan section) Acute Rehab OT Goals Patient Stated Goal: Return to facility OT Goal Formulation: With patient Time For Goal Achievement: 03/17/23 Potential to Achieve Goals: Good ADL Goals Pt Will Perform Grooming: with set-up;sitting Pt Will Perform Upper Body Bathing: with supervision;sitting Pt Will Perform Upper Body Dressing: with supervision;sitting Pt Will Transfer to Toilet: with min assist;stand pivot transfer;bedside commode  OT Frequency: Min 1X/week    Co-evaluation              AM-PAC OT "6 Clicks" Daily Activity     Outcome Measure Help from another person eating meals?: A Little Help from another person taking care of personal grooming?: A Little Help from another person toileting, which includes using toliet, bedpan, or urinal?: A Lot Help from another person bathing (including washing, rinsing, drying)?: A Lot Help from another person to put on and taking off regular upper body clothing?: A Little Help from another person to put on and taking off regular lower body clothing?: A Lot 6 Click Score: 15   End of Session Equipment Utilized  During Treatment: Gait belt Nurse Communication: Mobility status  Activity Tolerance: Patient tolerated treatment well Patient left: in bed;with call bell/phone within reach;with bed alarm set  OT Visit Diagnosis: Unsteadiness on feet (R26.81);Muscle weakness (generalized) (M62.81);Other symptoms and signs involving cognitive function                Time: 1450-1510 OT Time Calculation (min): 20 min Charges:  OT General Charges $OT Visit: 1 Visit OT Evaluation $OT Eval Moderate Complexity: 1 Mod  03/03/2023  RP, OTR/L  Acute Rehabilitation Services  Office:  [phone]   Suzanna Obey 03/03/2023, 3:20 PM

## 2023-03-03 NOTE — Care Management Obs Status (Signed)
MEDICARE OBSERVATION STATUS NOTIFICATION   Patient Details  Name: MARRANDA ARAKELIAN MRN: [id number] Date of Birth: 24-Dec-1940   Medicare Observation Status Notification Given:  Yes    Oletta Cohn, RN 03/03/2023, 9:34 AM

## 2023-03-03 NOTE — Evaluation (Signed)
Physical Therapy Evaluation Patient Details Name: [name] MRN: [id number] DOB: [birth year] Today's Date: 03/03/2023  History of Present Illness  83 year old from nursing home admitted 1/30 who fell in the bathroom and was brought to the ER.  Patient denies any symptoms other than losing balance and hitting her head on the toilet paper roll. Chest x-ray with left lower lobe pneumonia with small parapneumonic effusion.Marland Kitchen  PMH: hypertension, hyperlipidemia, type 2 diabetes, prior stroke  Clinical Impression  Pt admitted with above diagnosis. Pt was able to sit EOB with min assist for a few minutes.  Pt unable to stand and unsure if the stretcher height caused her instability vs pt unable to stand. Pt states she pivoted to wheelchair on her own at facility and walked with assist. Will follow acutely and determine pts functional status.  Recommend pt return to post acute skilled care < 3hours therapy day.   Pt currently with functional limitations due to the deficits listed below (see PT Problem List). Pt will benefit from acute skilled PT to increase their independence and safety with mobility to allow discharge.           If plan is discharge home, recommend the following: Two people to help with walking and/or transfers;Two people to help with bathing/dressing/bathroom;Assistance with cooking/housework;Assist for transportation;Help with stairs or ramp for entrance   Can travel by private vehicle   No    Equipment Recommendations Other (comment) (TBA)  Recommendations for Other Services       Functional Status Assessment Patient has had a recent decline in their functional status and demonstrates the ability to make significant improvements in function in a reasonable and predictable amount of time.     Precautions / Restrictions Precautions Precautions: Fall Restrictions Weight Bearing Restrictions Per Provider Order: No      Mobility  Bed Mobility Overal bed mobility: Needs  Assistance Bed Mobility: Rolling, Sidelying to Sit Rolling: Min assist, Used rails Sidelying to sit: Mod assist, +2 for physical assistance, Used rails       General bed mobility comments: Pt able to use rails to roll and needed min assist only.  Needed incr assist to come to eOB with pt with posterior lean making coming to edge of stretcher difficult    Transfers Overall transfer level: Needs assistance Equipment used: 2 person hand held assist Transfers: Sit to/from Stand Sit to Stand: Total assist, From elevated surface           General transfer comment: Attempted standing from stretcher and pt leaned significantly posteriorly and could not keep feet flat with pt's feet sliding. Stretcher made it difficult therefore assist her back to the stretcher.  Will need +2 to safely mobilize.  Pt also c/o chronic back pain and question whether pt walked before and if she needed +2 at facility.    Ambulation/Gait               General Gait Details: N/A currently  Stairs            Wheelchair Mobility     Tilt Bed    Modified Rankin (Stroke Patients Only)       Balance                                             Pertinent Vitals/Pain Pain Assessment Pain Assessment: Faces Faces Pain Scale: Hurts whole  lot Pain Location: back Pain Descriptors / Indicators: Grimacing, Guarding Pain Intervention(s): Limited activity within patient's tolerance, Monitored during session, Repositioned    Home Living Family/patient expects to be discharged to:: Skilled nursing facility                   Additional Comments: ? long term resident?    Prior Function Prior Level of Function : Needs assist             Mobility Comments: Pt reports she could transfer to and from wheelchair and used RW wtih assist       Extremity/Trunk Assessment   Upper Extremity Assessment Upper Extremity Assessment: Defer to OT evaluation    Lower Extremity  Assessment Lower Extremity Assessment: Generalized weakness    Cervical / Trunk Assessment Cervical / Trunk Assessment: Kyphotic  Communication   Communication Communication: No apparent difficulties  Cognition Arousal: Alert Behavior During Therapy: WFL for tasks assessed/performed Overall Cognitive Status: Impaired/Different from baseline Area of Impairment: Orientation, Memory, Following commands, Safety/judgement, Awareness, Problem solving                 Orientation Level: Disoriented to, Time, Situation   Memory: Decreased recall of precautions, Decreased short-term memory Following Commands: Follows one step commands with increased time Safety/Judgement: Decreased awareness of safety, Decreased awareness of deficits   Problem Solving: Slow processing, Decreased initiation, Difficulty sequencing, Requires verbal cues, Requires tactile cues          General Comments General comments (skin integrity, edema, etc.): VSS    Exercises     Assessment/Plan    PT Assessment Patient needs continued PT services  PT Problem List Decreased mobility;Decreased balance;Decreased activity tolerance;Decreased knowledge of use of DME;Decreased safety awareness;Decreased knowledge of precautions;Pain       PT Treatment Interventions DME instruction;Functional mobility training;Therapeutic activities;Balance training;Therapeutic exercise;Patient/family education    PT Goals (Current goals can be found in the Care Plan section)  Acute Rehab PT Goals Patient Stated Goal: to go back to [facility] PT Goal Formulation: With patient Time For Goal Achievement: 03/17/23 Potential to Achieve Goals: Fair    Frequency Min 1X/week     Co-evaluation               AM-PAC PT "6 Clicks" Mobility  Outcome Measure Help needed turning from your back to your side while in a flat bed without using bedrails?: A Little Help needed moving from lying on your back to sitting on the side of a  flat bed without using bedrails?: A Lot Help needed moving to and from a bed to a chair (including a wheelchair)?: Total Help needed standing up from a chair using your arms (e.g., wheelchair or bedside chair)?: Total Help needed to walk in hospital room?: Total Help needed climbing 3-5 steps with a railing? : Total 6 Click Score: 9    End of Session Equipment Utilized During Treatment: Gait belt Activity Tolerance: Patient limited by fatigue Patient left: with call bell/phone within reach (on stretcher) Nurse Communication: Mobility status;Need for lift equipment PT Visit Diagnosis: Muscle weakness (generalized) (M62.81);Pain Pain - part of body:  (back)    Time: 5409-8119 PT Time Calculation (min) (ACUTE ONLY): 19 min   Charges:   PT Evaluation $PT Eval Moderate Complexity: 1 Mod   PT General Charges $$ ACUTE PT VISIT: 1 Visit         Jossie Smoot M,PT Acute Rehab Services [phone]   Bevelyn Buckles 03/03/2023, 2:09 PM

## 2023-03-03 NOTE — Progress Notes (Signed)
Patient arrives to 5W35 at this time

## 2023-03-04 DIAGNOSIS — R4182 Altered mental status, unspecified: Secondary | ICD-10-CM | POA: Diagnosis not present

## 2023-03-04 DIAGNOSIS — N390 Urinary tract infection, site not specified: Secondary | ICD-10-CM | POA: Diagnosis not present

## 2023-03-04 DIAGNOSIS — I10 Essential (primary) hypertension: Secondary | ICD-10-CM

## 2023-03-04 DIAGNOSIS — J189 Pneumonia, unspecified organism: Secondary | ICD-10-CM | POA: Diagnosis not present

## 2023-03-04 LAB — COMPREHENSIVE METABOLIC PANEL
ALT: 43 U/L (ref 0–44)
AST: 42 U/L — ABNORMAL HIGH (ref 15–41)
Albumin: 3.1 g/dL — ABNORMAL LOW (ref 3.5–5.0)
Alkaline Phosphatase: 51 U/L (ref 38–126)
Anion gap: 12 (ref 5–15)
BUN: 12 mg/dL (ref 8–23)
CO2: 24 mmol/L (ref 22–32)
Calcium: 8.6 mg/dL — ABNORMAL LOW (ref 8.9–10.3)
Chloride: 102 mmol/L (ref 98–111)
Creatinine, Ser: 0.95 mg/dL (ref 0.44–1.00)
GFR, Estimated: 59 mL/min — ABNORMAL LOW (ref >60)
Glucose, Bld: 189 mg/dL — ABNORMAL HIGH (ref 70–99)
Potassium: 4.1 mmol/L (ref 3.5–5.1)
Sodium: 138 mmol/L (ref 135–145)
Total Bilirubin: 0.8 mg/dL (ref 0.0–1.2)
Total Protein: 6.7 g/dL (ref 6.5–8.1)

## 2023-03-04 LAB — URINE CULTURE: Culture: >=100000 — AB

## 2023-03-04 LAB — CBC WITH DIFFERENTIAL/PLATELET
Abs Immature Granulocytes: 0.04 10*3/uL (ref 0.00–0.07)
Basophils Absolute: 0 10*3/uL (ref 0.0–0.1)
Basophils Relative: 0 %
Eosinophils Absolute: 0.4 10*3/uL (ref 0.0–0.5)
Eosinophils Relative: 4 %
HCT: 32.6 % — ABNORMAL LOW (ref 36.0–46.0)
Hemoglobin: 10.6 g/dL — ABNORMAL LOW (ref 12.0–15.0)
Immature Granulocytes: 0 %
Lymphocytes Relative: 21 %
Lymphs Abs: 1.9 10*3/uL (ref 0.7–4.0)
MCH: 28.8 pg (ref 26.0–34.0)
MCHC: 32.5 g/dL (ref 30.0–36.0)
MCV: 88.6 fL (ref 80.0–100.0)
Monocytes Absolute: 0.7 10*3/uL (ref 0.1–1.0)
Monocytes Relative: 8 %
Neutro Abs: 6 10*3/uL (ref 1.7–7.7)
Neutrophils Relative %: 67 %
Platelets: 171 10*3/uL (ref 150–400)
RBC: 3.68 MIL/uL — ABNORMAL LOW (ref 3.87–5.11)
RDW: 13.2 % (ref 11.5–15.5)
WBC: 9 10*3/uL (ref 4.0–10.5)
nRBC: 0 % (ref 0.0–0.2)

## 2023-03-04 LAB — MAGNESIUM: Magnesium: 1.8 mg/dL (ref 1.7–2.4)

## 2023-03-04 LAB — GLUCOSE, CAPILLARY
Glucose-Capillary: 220 mg/dL — ABNORMAL HIGH (ref 70–99)
Glucose-Capillary: 265 mg/dL — ABNORMAL HIGH (ref 70–99)
Glucose-Capillary: 150 mg/dL — ABNORMAL HIGH (ref 70–99)
Glucose-Capillary: 79 mg/dL (ref 70–99)
Glucose-Capillary: 95 mg/dL (ref 70–99)

## 2023-03-04 LAB — PHOSPHORUS: Phosphorus: 2.8 mg/dL (ref 2.5–4.6)

## 2023-03-04 MED ORDER — SODIUM CHLORIDE 0.9 % IV SOLN
2.0000 g | INTRAVENOUS | Status: AC
  Administered 2023-03-04 – 2023-03-06 (×3): 2 g via INTRAVENOUS
  Filled 2023-03-04 (×3): qty 20

## 2023-03-04 MED ORDER — DEXTROSE 50 % IV SOLN: 1.0000 | INTRAVENOUS | Status: DC | PRN

## 2023-03-04 NOTE — Progress Notes (Signed)
Patient's RN reported that blood glucose is low 79.  Recommending to hold the Semglee 10 units for tonight.  Tereasa Coop, MD [facility] 03/04/2023, 9:41 PM

## 2023-03-04 NOTE — Progress Notes (Addendum)
PROGRESS NOTE        PATIENT DETAILS Name: [name] Age: 83 y.o. Sex: female Date of Birth: 1940-12-29 Admit Date: 03/02/2023 Admitting Physician Dorcas Carrow, MD [id number], [facility]  Brief Summary: Patient is a 83 y.o.  female with history of HTN, HLD, DM-2, prior CVA, depression, dementia-who was brought in from SNF for altered mental status/unwitnessed fall-she was  found to have UTI/PNA and subsequently admitted to the hospitalist service.  Significant events: 1/30>> admit to [facility].  Significant studies: 1/30>> CXR: Left lower lobe pneumonia-small left parapneumonic pleural effusion. 1/30>> CT head: No acute intracranial abnormalities 1/30>> CT C-spine: No fracture/subluxation  Significant microbiology data: 1/30>> COVID/influenza/RSV PCR: Negative 1/30>> blood culture: No growth 1/30>> urine culture: Enterobacter-sensitivity pending  Procedures: None  Consults: None  Subjective: Awake-alert-answering almost all my questions appropriately-denies any chest pain or shortness of breath-some mild headache.  Is trying to get her breakfast reorganize so she can start eating.  Objective: Vitals: Blood pressure 136/74, pulse 81, temperature 98.2 F (36.8 C), temperature source Oral, resp. rate 18, weight 72.9 kg, SpO2 94%.   Exam: Gen Exam:Alert awake-not in any distress HEENT:atraumatic, normocephalic Chest: B/L clear to auscultation anteriorly CVS:S1S2 regular Abdomen:soft non tender, non distended Extremities:no edema Neurology: Non focal Skin: no rash  Pertinent Labs/Radiology:    Latest Ref Rng & Units 03/04/2023    6:00 AM 03/03/2023    5:27 AM 03/02/2023    6:10 AM  CBC  WBC 4.0 - 10.5 K/uL 9.0  10.5  16.5   Hemoglobin 12.0 - 15.0 g/dL 01.7  9.9  51.0   Hematocrit 36.0 - 46.0 % 32.6  30.8  33.2   Platelets 150 - 400 K/uL 171  164  194     Lab Results  Component Value Date   NA 138 03/04/2023   K 4.1  03/04/2023   CL 102 03/04/2023   CO2 24 03/04/2023      Assessment/Plan: Acute metabolic encephalopathy Likely secondary to PNA/UTI Improved-awake/alert this morning-suspect she is not far from baseline-no family at bedside.  Probable aspiration pneumonia-left lobar PNA Suspect may have aspirated when she was encephalopathic Appreciate SLP eval-continue dysphagia 3 diet which she seems to be tolerating without any issues Continue empiric antibiotics May have a small effusion-based on imaging-she is clinically improved-will complete course of antibiotics and will require reimaging.  Probable asymptomatic bacteriuria No symptoms of UTI-however she was encephalopathic when she came in Irrespective-already on antibiotics  Hypokalemia/hypomagnesemia Repleted  HTN BP relatively stable Continue amlodipine/irbesartan  HLD Statin  DM-2 (A1c 6.5 on 1/30) CBG stable Continue 10 units of Semglee twice daily+ SSI Not a candidate for tight glycemic control-allow some amount of permissive hyperglycemia  GERD PPI  History of CVA No obvious focal deficits Aspirin/statin  Mood disorder Zoloft  Dementia Appears to be mild  BMI: Estimated body mass index is 27.59 kg/m as calculated from the following:   Height as of 07/15/22: 5\' 4"  (1.626 m).   Weight as of this encounter: 72.9 kg.   Code status:   Code Status: Limited: Do not attempt resuscitation (DNR) -DNR-LIMITED -Do Not Intubate/DNI    DVT Prophylaxis: enoxaparin (LOVENOX) injection 40 mg Start: 03/02/23 1045   Family Communication: None at bedside   Disposition Plan: Status is: Inpatient Remains inpatient appropriate because: Severity of illness   Planned Discharge Destination:Skilled nursing facility  Diet: Diet Order             DIET DYS 3 Room service appropriate? Yes with Assist; Fluid consistency: Thin  Diet effective now                     Antimicrobial agents: Anti-infectives (From  admission, onward)    Start     Dose/Rate Route Frequency Ordered Stop   03/04/23 1100  cefTRIAXone (ROCEPHIN) 2 g in sodium chloride 0.9 % 100 mL IVPB        2 g 200 mL/hr over 30 Minutes Intravenous Every 24 hours 03/04/23 0655 03/07/23 1059   03/03/23 1100  cefTRIAXone (ROCEPHIN) 1 g in sodium chloride 0.9 % 100 mL IVPB  Status:  Discontinued        1 g 200 mL/hr over 30 Minutes Intravenous Every 24 hours 03/02/23 1039 03/04/23 0655   03/03/23 1100  azithromycin (ZITHROMAX) 500 mg in sodium chloride 0.9 % 250 mL IVPB        500 mg 250 mL/hr over 60 Minutes Intravenous Every 24 hours 03/02/23 1039 03/07/23 1059   03/02/23 0745  cefTRIAXone (ROCEPHIN) 1 g in sodium chloride 0.9 % 100 mL IVPB        1 g 200 mL/hr over 30 Minutes Intravenous  Once 03/02/23 0732 03/02/23 0927   03/02/23 0745  azithromycin (ZITHROMAX) 500 mg in sodium chloride 0.9 % 250 mL IVPB        500 mg 250 mL/hr over 60 Minutes Intravenous  Once 03/02/23 0732 03/02/23 1420        MEDICATIONS: Scheduled Meds:  amLODipine  5 mg Oral Daily   aspirin EC  81 mg Oral Daily   enoxaparin (LOVENOX) injection  40 mg Subcutaneous Q24H   famotidine  20 mg Oral QPM   insulin aspart  0-15 Units Subcutaneous TID WC   insulin glargine-yfgn  10 Units Subcutaneous BID   irbesartan  300 mg Oral Daily   pantoprazole  40 mg Oral Daily   potassium chloride  40 mEq Oral TID   rosuvastatin  10 mg Oral Daily   sertraline  100 mg Oral QHS   Continuous Infusions:  azithromycin 500 mg (03/03/23 1357)   cefTRIAXone (ROCEPHIN)  IV     PRN Meds:.acetaminophen **OR** acetaminophen, oxyCODONE   I have personally reviewed following labs and imaging studies  LABORATORY DATA: CBC: Recent Labs  Lab 03/02/23 0610 03/03/23 0527 03/04/23 0600  WBC 16.5* 10.5 9.0  NEUTROABS 15.1*  --  6.0  HGB 10.7* 9.9* 10.6*  HCT 33.2* 30.8* 32.6*  MCV 88.3 88.5 88.6  PLT 194 164 171    Basic Metabolic Panel: Recent Labs  Lab  03/02/23 0610 03/03/23 0527 03/04/23 0600 03/04/23 0655  NA 139 135  --  138  K 2.7* 3.0*  --  4.1  CL 101 104  --  102  CO2 24 25  --  24  GLUCOSE 139* 231*  --  189*  BUN 19 13  --  12  CREATININE 1.04* 0.90  --  0.95  CALCIUM 9.1 8.2*  --  8.6*  MG  --  1.1*  --  1.8  PHOS  --   --  2.8  --     GFR: CrCl cannot be calculated (Unknown ideal weight.).  Liver Function Tests: Recent Labs  Lab 03/02/23 0610 03/04/23 0655  AST 23 42*  ALT 22 43  ALKPHOS 49 51  BILITOT 1.1 0.8  PROT  7.2 6.7  ALBUMIN 3.5 3.1*   No results for input(s): "LIPASE", "AMYLASE" in the last 168 hours. No results for input(s): "AMMONIA" in the last 168 hours.  Coagulation Profile: No results for input(s): "INR", "PROTIME" in the last 168 hours.  Cardiac Enzymes: Recent Labs  Lab 03/02/23 2139  CKTOTAL 245*    BNP (last 3 results) No results for input(s): "PROBNP" in the last 8760 hours.  Lipid Profile: No results for input(s): "CHOL", "HDL", "LDLCALC", "TRIG", "CHOLHDL", "LDLDIRECT" in the last 72 hours.  Thyroid Function Tests: No results for input(s): "TSH", "T4TOTAL", "FREET4", "T3FREE", "THYROIDAB" in the last 72 hours.  Anemia Panel: No results for input(s): "VITAMINB12", "FOLATE", "FERRITIN", "TIBC", "IRON", "RETICCTPCT" in the last 72 hours.  Urine analysis:    Component Value Date/Time   COLORURINE YELLOW 03/02/2023 0927   APPEARANCEUR HAZY (A) 03/02/2023 0927   LABSPEC 1.016 03/02/2023 0927   PHURINE 5.0 03/02/2023 0927   GLUCOSEU NEGATIVE 03/02/2023 0927   HGBUR MODERATE (A) 03/02/2023 0927   HGBUR negative 07/09/2009 1338   BILIRUBINUR NEGATIVE 03/02/2023 0927   BILIRUBINUR neg 04/30/2010 1325   KETONESUR NEGATIVE 03/02/2023 0927   PROTEINUR NEGATIVE 03/02/2023 0927   UROBILINOGEN 0.2 02/13/2011 1936   NITRITE NEGATIVE 03/02/2023 0927   LEUKOCYTESUR MODERATE (A) 03/02/2023 0927    Sepsis Labs: Lactic Acid, Venous    Component Value Date/Time   LATICACIDVEN  1.4 03/02/2023 5621    MICROBIOLOGY: Recent Results (from the past 240 hours)  Resp panel by RT-PCR (RSV, Flu A&B, Covid) Anterior Nasal Swab     Status: None   Collection Time: 03/02/23  6:23 AM   Specimen: Anterior Nasal Swab  Result Value Ref Range Status   SARS Coronavirus 2 by RT PCR NEGATIVE NEGATIVE Final   Influenza A by PCR NEGATIVE NEGATIVE Final   Influenza B by PCR NEGATIVE NEGATIVE Final    Comment: (NOTE) The Xpert Xpress SARS-CoV-2/FLU/RSV plus assay is intended as an aid in the diagnosis of influenza from Nasopharyngeal swab specimens and should not be used as a sole basis for treatment. Nasal washings and aspirates are unacceptable for Xpert Xpress SARS-CoV-2/FLU/RSV testing.  Fact Sheet for Patients: BloggerCourse.com  Fact Sheet for Healthcare Providers: SeriousBroker.it  This test is not yet approved or cleared by the [place] FDA and has been authorized for detection and/or diagnosis of SARS-CoV-2 by FDA under an Emergency Use Authorization (EUA). This EUA will remain in effect (meaning this test can be used) for the duration of the COVID-19 declaration under Section 564(b)(1) of the Act, 21 U.S.C. section 360bbb-3(b)(1), unless the authorization is terminated or revoked.     Resp Syncytial Virus by PCR NEGATIVE NEGATIVE Final    Comment: (NOTE) Fact Sheet for Patients: BloggerCourse.com  Fact Sheet for Healthcare Providers: SeriousBroker.it  This test is not yet approved or cleared by the [place] FDA and has been authorized for detection and/or diagnosis of SARS-CoV-2 by FDA under an Emergency Use Authorization (EUA). This EUA will remain in effect (meaning this test can be used) for the duration of the COVID-19 declaration under Section 564(b)(1) of the Act, 21 U.S.C. section 360bbb-3(b)(1), unless the authorization is terminated  or revoked.  Performed at [facility], [address], [place], [place] [address]   Urine Culture     Status: Abnormal (Preliminary result)   Collection Time: 03/02/23  9:27 AM   Specimen: Urine, Random  Result Value Ref Range Status   Specimen Description URINE, RANDOM  Final  Special Requests NONE Reflexed from [id number]  Final   Culture (A)  Final    >=100,000 COLONIES/mL ENTEROBACTER SPECIES SUSCEPTIBILITIES TO FOLLOW Performed at [facility], [address] [place], [place] [address]    Report Status PENDING  Incomplete  Culture, blood (Routine X 2) w Reflex to ID Panel     Status: None (Preliminary result)   Collection Time: 03/02/23  9:39 PM   Specimen: BLOOD RIGHT HAND  Result Value Ref Range Status   Specimen Description BLOOD RIGHT HAND  Final   Special Requests   Final    BOTTLES DRAWN AEROBIC AND ANAEROBIC Blood Culture adequate volume   Culture   Final    NO GROWTH < 12 HOURS Performed at [facility], [address], [place], [place] [address]    Report Status PENDING  Incomplete  Culture, blood (Routine X 2) w Reflex to ID Panel     Status: None (Preliminary result)   Collection Time: 03/02/23  9:40 PM   Specimen: BLOOD LEFT HAND  Result Value Ref Range Status   Specimen Description BLOOD LEFT HAND  Final   Special Requests   Final    BOTTLES DRAWN AEROBIC AND ANAEROBIC Blood Culture results may not be optimal due to an inadequate volume of blood received in culture bottles   Culture   Final    NO GROWTH < 12 HOURS Performed at [facility], [address] [place], [place] [address]    Report Status PENDING  Incomplete    RADIOLOGY STUDIES/RESULTS: No results found.   LOS: 1 day   Jeoffrey Massed, MD  [facility]    To contact the attending provider between 7A-7P or the covering provider during after hours 7P-7A, please log into the web site [email or web address] and access using universal [place] password for that web  site. If you do not have the password, please call the hospital operator.  03/04/2023, 8:40 AM

## 2023-03-04 NOTE — Plan of Care (Signed)

## 2023-03-04 NOTE — Plan of Care (Signed)
  Problem: Coping: Goal: Ability to adjust to condition or change in health will improve Outcome: Progressing   Problem: Metabolic: Goal: Ability to maintain appropriate glucose levels will improve Outcome: Progressing   Problem: Clinical Measurements: Goal: Will remain free from infection Outcome: Progressing Goal: Diagnostic test results will improve Outcome: Progressing Goal: Respiratory complications will improve Outcome: Progressing   Problem: Activity: Goal: Risk for activity intolerance will decrease Outcome: Progressing

## 2023-03-05 DIAGNOSIS — F0390 Unspecified dementia without behavioral disturbance: Secondary | ICD-10-CM

## 2023-03-05 DIAGNOSIS — J189 Pneumonia, unspecified organism: Secondary | ICD-10-CM | POA: Diagnosis not present

## 2023-03-05 DIAGNOSIS — R4182 Altered mental status, unspecified: Secondary | ICD-10-CM | POA: Diagnosis not present

## 2023-03-05 LAB — GLUCOSE, CAPILLARY
Glucose-Capillary: 114 mg/dL — ABNORMAL HIGH (ref 70–99)
Glucose-Capillary: 189 mg/dL — ABNORMAL HIGH (ref 70–99)
Glucose-Capillary: 143 mg/dL — ABNORMAL HIGH (ref 70–99)
Glucose-Capillary: 160 mg/dL — ABNORMAL HIGH (ref 70–99)

## 2023-03-05 NOTE — Progress Notes (Signed)
PROGRESS NOTE        PATIENT DETAILS Name: [name] Age: 83 y.o. Sex: female Date of Birth: [birth year] Admit Date: 03/02/2023 Admitting Physician Dorcas Carrow, MD [id number], [facility]  Brief Summary: Patient is a 83 y.o.  female with history of HTN, HLD, DM-2, prior CVA, depression, dementia-who was brought in from SNF for altered mental status/unwitnessed fall-she was  found to have UTI/PNA and subsequently admitted to the hospitalist service.  Significant events: 1/30>> admit to [facility].  Significant studies: 1/30>> CXR: Left lower lobe pneumonia-small left parapneumonic pleural effusion. 1/30>> CT head: No acute intracranial abnormalities 1/30>> CT C-spine: No fracture/subluxation  Significant microbiology data: 1/30>> COVID/influenza/RSV PCR: Negative 1/30>> blood culture: No growth 1/30>> urine culture: Enterobacter  Procedures: None  Consults: None  Subjective: Sleeping comfortably-no major issues overnight  Objective: Vitals: Blood pressure (!) 156/72, pulse 67, temperature 98.2 F (36.8 C), temperature source Oral, resp. rate 16, height 5\' 4"  (1.626 m), weight 72.9 kg, SpO2 100%.   Exam: Gen Exam:Alert awake-not in any distress HEENT:atraumatic, normocephalic Chest: B/L clear to auscultation anteriorly CVS:S1S2 regular Abdomen:soft non tender, non distended Extremities:no edema Neurology: Non focal Skin: no rash  Pertinent Labs/Radiology:    Latest Ref Rng & Units 03/04/2023    6:00 AM 03/03/2023    5:27 AM 03/02/2023    6:10 AM  CBC  WBC 4.0 - 10.5 K/uL 9.0  10.5  16.5   Hemoglobin 12.0 - 15.0 g/dL 13.0  9.9  86.5   Hematocrit 36.0 - 46.0 % 32.6  30.8  33.2   Platelets 150 - 400 K/uL 171  164  194     Lab Results  Component Value Date   NA 138 03/04/2023   K 4.1 03/04/2023   CL 102 03/04/2023   CO2 24 03/04/2023      Assessment/Plan: Acute metabolic encephalopathy Likely secondary to  PNA/UTI Improved-awake/alert this morning-suspect not far from usual baseline-no family at bedside.    Probable aspiration pneumonia-left lobar PNA Suspect may have aspirated when she was encephalopathic Appreciate SLP eval-continue dysphagia 3 diet which she seems to be tolerating without any issues Continue empiric antibiotics May have a small effusion-based on imaging-she is clinically improved-once she completes a course of antibiotics-she will require reimaging.  Probable asymptomatic bacteriuria No symptoms of UTI She has improved with IV Rocephin-urine culture positive for Enterobacter cloacae-no indication to treat at this point.  Hypokalemia/hypomagnesemia Repleted  HTN BP relatively stable Continue amlodipine/irbesartan  HLD Statin  DM-2 (A1c 6.5 on 1/30) CBG stable Continue 10 units of Semglee twice daily+ SSI Not a candidate for tight glycemic control-allow some amount of permissive hyperglycemia  Recent Labs    03/04/23 2118 03/04/23 2303 03/05/23 0816  GLUCAP 79 95 114*     GERD PPI  History of CVA No obvious focal deficits Aspirin/statin  Mood disorder Zoloft  Dementia Appears to be mild  BMI: Estimated body mass index is 27.59 kg/m as calculated from the following:   Height as of this encounter: 5\' 4"  (1.626 m).   Weight as of this encounter: 72.9 kg.   Code status:   Code Status: Limited: Do not attempt resuscitation (DNR) -DNR-LIMITED -Do Not Intubate/DNI    DVT Prophylaxis: enoxaparin (LOVENOX) injection 40 mg Start: 03/02/23 1045   Family Communication:  Son-[phone] left VM 2/2 Daughter-[phone] discussed over  the phone on 2/2.   Disposition Plan: Status is: Inpatient Remains inpatient appropriate because: Severity of illness   Planned Discharge Destination:Skilled nursing facility   Diet: Diet Order             DIET DYS 3 Room service appropriate? Yes with Assist; Fluid consistency: Thin   Diet effective now                     Antimicrobial agents: Anti-infectives (From admission, onward)    Start     Dose/Rate Route Frequency Ordered Stop   03/04/23 1100  cefTRIAXone (ROCEPHIN) 2 g in sodium chloride 0.9 % 100 mL IVPB        2 g 200 mL/hr over 30 Minutes Intravenous Every 24 hours 03/04/23 0655 03/07/23 1059   03/03/23 1100  cefTRIAXone (ROCEPHIN) 1 g in sodium chloride 0.9 % 100 mL IVPB  Status:  Discontinued        1 g 200 mL/hr over 30 Minutes Intravenous Every 24 hours 03/02/23 1039 03/04/23 0655   03/03/23 1100  azithromycin (ZITHROMAX) 500 mg in sodium chloride 0.9 % 250 mL IVPB        500 mg 250 mL/hr over 60 Minutes Intravenous Every 24 hours 03/02/23 1039 03/07/23 1059   03/02/23 0745  cefTRIAXone (ROCEPHIN) 1 g in sodium chloride 0.9 % 100 mL IVPB        1 g 200 mL/hr over 30 Minutes Intravenous  Once 03/02/23 0732 03/02/23 0927   03/02/23 0745  azithromycin (ZITHROMAX) 500 mg in sodium chloride 0.9 % 250 mL IVPB        500 mg 250 mL/hr over 60 Minutes Intravenous  Once 03/02/23 0732 03/02/23 1420        MEDICATIONS: Scheduled Meds:  amLODipine  5 mg Oral Daily   aspirin EC  81 mg Oral Daily   enoxaparin (LOVENOX) injection  40 mg Subcutaneous Q24H   famotidine  20 mg Oral QPM   insulin aspart  0-15 Units Subcutaneous TID WC   insulin glargine-yfgn  10 Units Subcutaneous BID   irbesartan  300 mg Oral Daily   pantoprazole  40 mg Oral Daily   rosuvastatin  10 mg Oral Daily   sertraline  100 mg Oral QHS   Continuous Infusions:  azithromycin 500 mg (03/04/23 1301)   cefTRIAXone (ROCEPHIN)  IV 2 g (03/04/23 1225)   PRN Meds:.acetaminophen **OR** acetaminophen, dextrose, oxyCODONE   I have personally reviewed following labs and imaging studies  LABORATORY DATA: CBC: Recent Labs  Lab 03/02/23 0610 03/03/23 0527 03/04/23 0600  WBC 16.5* 10.5 9.0  NEUTROABS 15.1*  --  6.0  HGB 10.7* 9.9* 10.6*  HCT 33.2* 30.8* 32.6*  MCV 88.3 88.5  88.6  PLT 194 164 171    Basic Metabolic Panel: Recent Labs  Lab 03/02/23 0610 03/03/23 0527 03/04/23 0600 03/04/23 0655  NA 139 135  --  138  K 2.7* 3.0*  --  4.1  CL 101 104  --  102  CO2 24 25  --  24  GLUCOSE 139* 231*  --  189*  BUN 19 13  --  12  CREATININE 1.04* 0.90  --  0.95  CALCIUM 9.1 8.2*  --  8.6*  MG  --  1.1*  --  1.8  PHOS  --   --  2.8  --     GFR: Estimated Creatinine Clearance: 43.9 mL/min (by C-G formula based on SCr of 0.95 mg/dL).  Liver  Function Tests: Recent Labs  Lab 03/02/23 0610 03/04/23 0655  AST 23 42*  ALT 22 43  ALKPHOS 49 51  BILITOT 1.1 0.8  PROT 7.2 6.7  ALBUMIN 3.5 3.1*   No results for input(s): "LIPASE", "AMYLASE" in the last 168 hours. No results for input(s): "AMMONIA" in the last 168 hours.  Coagulation Profile: No results for input(s): "INR", "PROTIME" in the last 168 hours.  Cardiac Enzymes: Recent Labs  Lab 03/02/23 2139  CKTOTAL 245*    BNP (last 3 results) No results for input(s): "PROBNP" in the last 8760 hours.  Lipid Profile: No results for input(s): "CHOL", "HDL", "LDLCALC", "TRIG", "CHOLHDL", "LDLDIRECT" in the last 72 hours.  Thyroid Function Tests: No results for input(s): "TSH", "T4TOTAL", "FREET4", "T3FREE", "THYROIDAB" in the last 72 hours.  Anemia Panel: No results for input(s): "VITAMINB12", "FOLATE", "FERRITIN", "TIBC", "IRON", "RETICCTPCT" in the last 72 hours.  Urine analysis:    Component Value Date/Time   COLORURINE YELLOW 03/02/2023 0927   APPEARANCEUR HAZY (A) 03/02/2023 0927   LABSPEC 1.016 03/02/2023 0927   PHURINE 5.0 03/02/2023 0927   GLUCOSEU NEGATIVE 03/02/2023 0927   HGBUR MODERATE (A) 03/02/2023 0927   HGBUR negative 07/09/2009 1338   BILIRUBINUR NEGATIVE 03/02/2023 0927   BILIRUBINUR neg 04/30/2010 1325   KETONESUR NEGATIVE 03/02/2023 0927   PROTEINUR NEGATIVE 03/02/2023 0927   UROBILINOGEN 0.2 02/13/2011 1936   NITRITE NEGATIVE 03/02/2023 0927   LEUKOCYTESUR  MODERATE (A) 03/02/2023 0927    Sepsis Labs: Lactic Acid, Venous    Component Value Date/Time   LATICACIDVEN 1.4 03/02/2023 1610    MICROBIOLOGY: Recent Results (from the past 240 hours)  Resp panel by RT-PCR (RSV, Flu A&B, Covid) Anterior Nasal Swab     Status: None   Collection Time: 03/02/23  6:23 AM   Specimen: Anterior Nasal Swab  Result Value Ref Range Status   SARS Coronavirus 2 by RT PCR NEGATIVE NEGATIVE Final   Influenza A by PCR NEGATIVE NEGATIVE Final   Influenza B by PCR NEGATIVE NEGATIVE Final    Comment: (NOTE) The Xpert Xpress SARS-CoV-2/FLU/RSV plus assay is intended as an aid in the diagnosis of influenza from Nasopharyngeal swab specimens and should not be used as a sole basis for treatment. Nasal washings and aspirates are unacceptable for Xpert Xpress SARS-CoV-2/FLU/RSV testing.  Fact Sheet for Patients: BloggerCourse.com  Fact Sheet for Healthcare Providers: SeriousBroker.it  This test is not yet approved or cleared by the [place] FDA and has been authorized for detection and/or diagnosis of SARS-CoV-2 by FDA under an Emergency Use Authorization (EUA). This EUA will remain in effect (meaning this test can be used) for the duration of the COVID-19 declaration under Section 564(b)(1) of the Act, 21 U.S.C. section 360bbb-3(b)(1), unless the authorization is terminated or revoked.     Resp Syncytial Virus by PCR NEGATIVE NEGATIVE Final    Comment: (NOTE) Fact Sheet for Patients: BloggerCourse.com  Fact Sheet for Healthcare Providers: SeriousBroker.it  This test is not yet approved or cleared by the [place] FDA and has been authorized for detection and/or diagnosis of SARS-CoV-2 by FDA under an Emergency Use Authorization (EUA). This EUA will remain in effect (meaning this test can be used) for the duration of the COVID-19 declaration  under Section 564(b)(1) of the Act, 21 U.S.C. section 360bbb-3(b)(1), unless the authorization is terminated or revoked.  Performed at [facility], [address], [place], [place] [address]   Urine Culture     Status: Abnormal  Collection Time: 03/02/23  9:27 AM   Specimen: Urine, Random  Result Value Ref Range Status   Specimen Description URINE, RANDOM  Final   Special Requests   Final    NONE Reflexed from [id number] Performed at [facility], [address] [place], [place] [address]    Culture >=100,000 COLONIES/mL ENTEROBACTER CLOACAE (A)  Final   Report Status 03/04/2023 FINAL  Final   Organism ID, Bacteria ENTEROBACTER CLOACAE (A)  Final      Susceptibility   Enterobacter cloacae - MIC*    CEFEPIME 1 SENSITIVE Sensitive     CIPROFLOXACIN <=0.25 SENSITIVE Sensitive     GENTAMICIN <=1 SENSITIVE Sensitive     IMIPENEM 0.5 SENSITIVE Sensitive     NITROFURANTOIN 32 SENSITIVE Sensitive     TRIMETH/SULFA <=20 SENSITIVE Sensitive     PIP/TAZO >=128 RESISTANT Resistant ug/mL    * >=100,000 COLONIES/mL ENTEROBACTER CLOACAE  Culture, blood (Routine X 2) w Reflex to ID Panel     Status: None (Preliminary result)   Collection Time: 03/02/23  9:39 PM   Specimen: BLOOD RIGHT HAND  Result Value Ref Range Status   Specimen Description BLOOD RIGHT HAND  Final   Special Requests   Final    BOTTLES DRAWN AEROBIC AND ANAEROBIC Blood Culture adequate volume   Culture   Final    NO GROWTH 3 DAYS Performed at [facility], [address], [place], [place] [address]    Report Status PENDING  Incomplete  Culture, blood (Routine X 2) w Reflex to ID Panel     Status: None (Preliminary result)   Collection Time: 03/02/23  9:40 PM   Specimen: BLOOD LEFT HAND  Result Value Ref Range Status   Specimen Description BLOOD LEFT HAND  Final   Special Requests   Final    BOTTLES DRAWN AEROBIC AND ANAEROBIC Blood Culture results may not be optimal due to an inadequate volume of  blood received in culture bottles   Culture   Final    NO GROWTH 3 DAYS Performed at [facility], [address], [place], [place] [address]    Report Status PENDING  Incomplete    RADIOLOGY STUDIES/RESULTS: No results found.   LOS: 2 days   Jeoffrey Massed, MD  [facility]    To contact the attending provider between 7A-7P or the covering provider during after hours 7P-7A, please log into the web site [email or web address] and access using universal [place] password for that web site. If you do not have the password, please call the hospital operator.  03/05/2023, 10:59 AM

## 2023-03-06 DIAGNOSIS — E119 Type 2 diabetes mellitus without complications: Secondary | ICD-10-CM | POA: Diagnosis not present

## 2023-03-06 DIAGNOSIS — R4182 Altered mental status, unspecified: Secondary | ICD-10-CM | POA: Diagnosis not present

## 2023-03-06 DIAGNOSIS — J189 Pneumonia, unspecified organism: Secondary | ICD-10-CM | POA: Diagnosis not present

## 2023-03-06 LAB — GLUCOSE, CAPILLARY
Glucose-Capillary: 156 mg/dL — ABNORMAL HIGH (ref 70–99)
Glucose-Capillary: 239 mg/dL — ABNORMAL HIGH (ref 70–99)

## 2023-03-06 MED ORDER — AMLODIPINE BESYLATE 10 MG PO TABS
10.0000 mg | ORAL_TABLET | Freq: Every day | ORAL | Status: DC
Start: 2023-03-06 — End: 2023-03-06
  Administered 2023-03-06: 10 mg via ORAL
  Filled 2023-03-06: qty 1

## 2023-03-06 MED ORDER — NOVOLOG FLEXPEN 100 UNIT/ML ~~LOC~~ SOPN: PEN_INJECTOR | SUBCUTANEOUS | Status: AC

## 2023-03-06 MED ORDER — AMOXICILLIN-POT CLAVULANATE 500-125 MG PO TABS: 1.0000 | ORAL_TABLET | Freq: Three times a day (TID) | ORAL | Status: AC

## 2023-03-06 MED ORDER — LANTUS SOLOSTAR 100 UNIT/ML ~~LOC~~ SOPN: 10.0000 [IU] | PEN_INJECTOR | Freq: Two times a day (BID) | SUBCUTANEOUS | Status: AC

## 2023-03-06 NOTE — [place] FL2 (Signed)
[place] MEDICAID FL2 LEVEL OF CARE FORM     IDENTIFICATION  Patient Name: [name] Birthdate: 1940-07-30 Sex: female Admission Date (Current Location): 03/02/2023  [facility] and [place] Number:  Producer, television/film/video and Address:  [facility] [place], [address], [place], [place] [address]      Provider Number: [id number]  Attending Physician Name and Address:  Maretta Bees, MD  Relative Name and Phone Number:       Current Level of Care: Hospital Recommended Level of Care: Skilled Nursing Facility Prior Approval Number:    Date Approved/Denied:   PASRR Number: [id number]  Discharge Plan: SNF    Current Diagnoses: Patient Active Problem List   Diagnosis Date Noted   Pneumonia 03/03/2023   CAP (community acquired pneumonia) 03/02/2023   Pain due to onychomycosis of toenails of both feet 11/26/2020   Diabetes mellitus without complication (HCC) 11/26/2020   Chest pain, non-cardiac 05/17/2010   Constipation 05/17/2010   POLYARTHRITIS 04/05/2010   Asymptomatic postmenopausal status 02/15/2010   TINEA CORPORIS 10/20/2009   VAGINAL DISCHARGE 08/14/2009   GASTROPARESIS 06/30/2009   WEAKNESS 06/08/2009   FALL, HX OF 03/26/2009   NECK MASS 02/20/2009   CHRONIC PAIN SYNDROME 07/11/2008   DYSPHAGIA 06/23/2008   DEPRESSION 06/02/2008   Pain in limb 05/06/2008   HYPOKALEMIA 04/22/2008   MYALGIA 04/07/2008   HIP PAIN, RIGHT 01/08/2008   VAGINITIS 08/24/2007   Urinary tract infection, site not specified 07/11/2007   SPINAL STENOSIS, LUMBAR 06/13/2007   BACK PAIN, LUMBAR, WITH RADICULOPATHY 06/13/2007   Anemia, unspecified 05/02/2007   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   Dizziness and giddiness 12/20/2006   OTHER ACUTE SINUSITIS 12/06/2006   INSOMNIA 11/29/2006   GERD 10/27/2006   HIATAL HERNIA 10/27/2006   MOLE 09/07/2006   ANXIETY STATE NOS 08/10/2006   DIABETES MELLITUS, TYPE II 02/09/2006   HYPERLIPIDEMIA 02/09/2006   DEMENTIA  02/09/2006   HYPERTENSION 02/09/2006   CEREBROVASCULAR ACCIDENT 02/09/2006   INTERNAL HEMORRHOIDS 01/26/2006    Orientation RESPIRATION BLADDER Height & Weight     Self, Time, Situation, Place  Normal Incontinent Weight: 160 lb 11.5 oz (72.9 kg) Height:  5\' 4"  (162.6 cm)  BEHAVIORAL SYMPTOMS/MOOD NEUROLOGICAL BOWEL NUTRITION STATUS      Continent Diet (See dc summary)  AMBULATORY STATUS COMMUNICATION OF NEEDS Skin   Extensive Assist Verbally Normal                       Personal Care Assistance Level of Assistance  Bathing, Feeding, Dressing Bathing Assistance: Maximum assistance Feeding assistance: Limited assistance Dressing Assistance: Maximum assistance     Functional Limitations Info             SPECIAL CARE FACTORS FREQUENCY                       Contractures Contractures Info: Not present    Additional Factors Info  Code Status, Allergies Code Status Info: DNR Allergies Info: Ezetimibe-simvastatin           Current Medications (03/06/2023):  This is the current hospital active medication list Current Facility-Administered Medications  Medication Dose Route Frequency Provider Last Rate Last Admin   acetaminophen (TYLENOL) tablet 650 mg  650 mg Oral Q6H PRN Briscoe Burns, MD   650 mg at 03/04/23 2145   Or   acetaminophen (TYLENOL) suppository 650 mg  650 mg Rectal Q6H PRN Briscoe Burns, MD  amLODipine (NORVASC) tablet 10 mg  10 mg Oral Daily Maretta Bees, MD   10 mg at 03/06/23 1610   aspirin EC tablet 81 mg  81 mg Oral Daily Briscoe Burns, MD   81 mg at 03/06/23 0843   azithromycin (ZITHROMAX) 500 mg in sodium chloride 0.9 % 250 mL IVPB  500 mg Intravenous Q24H Briscoe Burns, MD 250 mL/hr at 03/05/23 1154 500 mg at 03/05/23 1154   cefTRIAXone (ROCEPHIN) 2 g in sodium chloride 0.9 % 100 mL IVPB  2 g Intravenous Q24H Maretta Bees, MD 200 mL/hr at 03/05/23 1357 2 g at 03/05/23 1357   dextrose 50 % solution 50 mL  1  ampule Intravenous PRN Sundil, Subrina, MD       enoxaparin (LOVENOX) injection 40 mg  40 mg Subcutaneous Q24H Briscoe Burns, MD   40 mg at 03/05/23 1053   famotidine (PEPCID) tablet 20 mg  20 mg Oral QPM Briscoe Burns, MD   20 mg at 03/05/23 1749   insulin aspart (novoLOG) injection 0-15 Units  0-15 Units Subcutaneous TID WC Briscoe Burns, MD   3 Units at 03/06/23 0842   insulin glargine-yfgn (SEMGLEE) injection 10 Units  10 Units Subcutaneous BID Briscoe Burns, MD   10 Units at 03/06/23 0844   irbesartan (AVAPRO) tablet 300 mg  300 mg Oral Daily Briscoe Burns, MD   300 mg at 03/06/23 9604   oxyCODONE (Oxy IR/ROXICODONE) immediate release tablet 5 mg  5 mg Oral Q6H PRN Dorcas Carrow, MD   5 mg at 03/04/23 1035   pantoprazole (PROTONIX) EC tablet 40 mg  40 mg Oral Daily Briscoe Burns, MD   40 mg at 03/06/23 0843   rosuvastatin (CRESTOR) tablet 10 mg  10 mg Oral Daily Briscoe Burns, MD   10 mg at 03/06/23 5409   sertraline (ZOLOFT) tablet 100 mg  100 mg Oral QHS Briscoe Burns, MD   100 mg at 03/05/23 2121     Discharge Medications: Please see discharge summary for a list of discharge medications.  Relevant Imaging Results:  Relevant Lab Results:   Additional Information SSN: [id number]  Mearl Latin, LCSW

## 2023-03-06 NOTE — Progress Notes (Signed)
Called [facility] and gave report to Marylu Lund, the nurse that will be caring for [name], she is familiar with the patient.  Patient will be going to Room 607A.

## 2023-03-06 NOTE — TOC Initial Note (Signed)
Transition of Care [facility]) - Initial/Assessment Note    Patient Details  Name: [name] MRN: [id number] Date of Birth: 19-Jan-1941  [facility] CM/SW Contact:    Mearl Latin, LCSW Phone Number: 03/06/2023, 9:30 AM  Clinical Narrative:                 Patient resides at [facility] SNF under long term care. CSW updated facility on patient's discharge today.    Expected Discharge Plan: Skilled Nursing Facility Barriers to Discharge: No Barriers Identified   Patient Goals and CMS Choice Patient states their goals for this hospitalization and ongoing recovery are:: Return to SNF          Expected Discharge Plan and Services In-house Referral: Clinical Social Work   Post Acute Care Choice: Skilled Nursing Facility Living arrangements for the past 2 months: Skilled Nursing Facility Expected Discharge Date: 03/06/23                                    Prior Living Arrangements/Services Living arrangements for the past 2 months: Skilled Nursing Facility Lives with:: Facility Resident Patient language and need for interpreter reviewed:: Yes Do you feel safe going back to the place where you live?: Yes      Need for Family Participation in Patient Care: No (Comment) Care giver support system in place?: Yes (comment)   Criminal Activity/Legal Involvement Pertinent to Current Situation/Hospitalization: No - Comment as needed  Activities of Daily Living   ADL Screening (condition at time of admission) Independently performs ADLs?: Yes (appropriate for developmental age) Is the patient deaf or have difficulty hearing?: No Does the patient have difficulty seeing, even when wearing glasses/contacts?: No Does the patient have difficulty concentrating, remembering, or making decisions?: No  Permission Sought/Granted Permission sought to share information with : Facility Industrial/product designer granted to share information with : Yes, Verbal  Permission Granted     Permission granted to share info w AGENCY: [facility]        Emotional Assessment Appearance:: Appears stated age Attitude/Demeanor/Rapport: Engaged Affect (typically observed): Accepting Orientation: : Oriented to Self, Oriented to Place, Oriented to  Time, Oriented to Situation Alcohol / Substance Use: Not Applicable Psych Involvement: No (comment)  Admission diagnosis:  CAP (community acquired pneumonia) [J18.9] Altered mental status, unspecified altered mental status type [R41.82] Pneumonia of left lung due to infectious organism, unspecified part of lung [J18.9] Pneumonia [J18.9] Patient Active Problem List   Diagnosis Date Noted   Pneumonia 03/03/2023   CAP (community acquired pneumonia) 03/02/2023   Pain due to onychomycosis of toenails of both feet 11/26/2020   Diabetes mellitus without complication (HCC) 11/26/2020   Chest pain, non-cardiac 05/17/2010   Constipation 05/17/2010   POLYARTHRITIS 04/05/2010   Asymptomatic postmenopausal status 02/15/2010   TINEA CORPORIS 10/20/2009   VAGINAL DISCHARGE 08/14/2009   GASTROPARESIS 06/30/2009   WEAKNESS 06/08/2009   FALL, HX OF 03/26/2009   NECK MASS 02/20/2009   CHRONIC PAIN SYNDROME 07/11/2008   DYSPHAGIA 06/23/2008   DEPRESSION 06/02/2008   Pain in limb 05/06/2008   HYPOKALEMIA 04/22/2008   MYALGIA 04/07/2008   HIP PAIN, RIGHT 01/08/2008   VAGINITIS 08/24/2007   Urinary tract infection, site not specified 07/11/2007   SPINAL STENOSIS, LUMBAR 06/13/2007   BACK PAIN, LUMBAR, WITH RADICULOPATHY 06/13/2007   Anemia, unspecified 05/02/2007   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   Dizziness and giddiness 12/20/2006  OTHER ACUTE SINUSITIS 12/06/2006   INSOMNIA 11/29/2006   GERD 10/27/2006   HIATAL HERNIA 10/27/2006   MOLE 09/07/2006   ANXIETY STATE NOS 08/10/2006   DIABETES MELLITUS, TYPE II 02/09/2006   HYPERLIPIDEMIA 02/09/2006   DEMENTIA 02/09/2006   HYPERTENSION 02/09/2006   CEREBROVASCULAR  ACCIDENT 02/09/2006   INTERNAL HEMORRHOIDS 01/26/2006   PCP:  Almetta Lovely, [facility]:   [id number] [place], [place] - [phone] [place] [address] Phone: [phone] Fax: [phone]  [id number] [address], [place] - [address] [place] [address] Phone: [phone] Fax: [phone]     Social Drivers of Health (SDOH) Social History: SDOH Screenings   Food Insecurity: No Food Insecurity (03/03/2023)  Housing: Low Risk  (03/03/2023)  Transportation Needs: No Transportation Needs (03/03/2023)  Utilities: Not At Risk (03/03/2023)  Social Connections: Unknown (03/03/2023)  Tobacco Use: Low Risk  (03/02/2023)   SDOH Interventions:     Readmission Risk Interventions     No data to display

## 2023-03-06 NOTE — TOC Transition Note (Signed)
Transition of Care [facility]) - Discharge Note   Patient Details  Name: [name] MRN: [id number] Date of Birth: 27-Nov-1940  [facility]) CM/SW Contact:  Mearl Latin, LCSW Phone Number: 03/06/2023, 12:47 PM   Clinical Narrative:    Patient will DC to: [facility] Anticipated DC date: 03/06/23 Family notified: Son, Gale Journey Transport by: Sharin Mons   Per MD patient ready for DC to [facility]. RN to call report prior to discharge [phone] room 607a). RN, patient, patient's family, and facility notified of DC. Discharge Summary and FL2 sent to facility. DC packet on chart including signed DNR. Ambulance transport requested for patient.   CSW will sign off for now as social work intervention is no longer needed. Please consult [place] again if new needs arise.     Final next level of care: Skilled Nursing Facility Barriers to Discharge: No Barriers Identified   Patient Goals and CMS Choice Patient states their goals for this hospitalization and ongoing recovery are:: Return to SNF          Discharge Placement   Existing PASRR number confirmed : 03/06/23          Patient chooses bed at: [facility], [place] Patient to be transferred to facility by: [facility] Name of family member notified: Son Patient and family notified of of transfer: 03/06/23  Discharge Plan and Services Additional resources added to the After Visit Summary for   In-house Referral: Clinical Social Work   Post Acute Care Choice: Skilled Nursing Facility                               Social Drivers of Health (SDOH) Interventions SDOH Screenings   Food Insecurity: No Food Insecurity (03/03/2023)  Housing: Low Risk  (03/03/2023)  Transportation Needs: No Transportation Needs (03/03/2023)  Utilities: Not At Risk (03/03/2023)  Social Connections: Unknown (03/03/2023)  Tobacco Use: Low Risk  (03/02/2023)     Readmission Risk Interventions     No data to display

## 2023-03-06 NOTE — Discharge Summary (Signed)
PATIENT DETAILS Name: [name] Age: 83 y.o. Sex: female Date of Birth: 1940-04-24 MRN: [id number]. Admitting Physician: Dorcas Carrow, MD [id number], [facility]  Admit Date: 03/02/2023 Discharge date: 03/06/2023  Recommendations for Outpatient Follow-up:  Follow up with PCP in 1-2 weeks Please obtain CMP/CBC in one week Please repeat two-view chest x-ray in 2-3 weeks-to ensure resolution of pneumonia/effusion.  Admitted From:  SNF  Disposition: Skilled nursing facility   Discharge Condition: fair  CODE STATUS:   Code Status: Limited: Do not attempt resuscitation (DNR) -DNR-LIMITED -Do Not Intubate/DNI    Diet recommendation:  Diet Order             Diet - low sodium heart healthy           Diet Carb Modified           DIET DYS 3 Room service appropriate? Yes with Assist; Fluid consistency: Thin  Diet effective now                    Brief Summary: Patient is a 83 y.o.  female with history of HTN, HLD, DM-2, prior CVA, depression, dementia-who was brought in from SNF for altered mental status/unwitnessed fall-she was  found to have UTI/PNA and subsequently admitted to the hospitalist service.   Significant events: 1/30>> admit to [facility].   Significant studies: 1/30>> CXR: Left lower lobe pneumonia-small left parapneumonic pleural effusion. 1/30>> CT head: No acute intracranial abnormalities 1/30>> CT C-spine: No fracture/subluxation   Significant microbiology data: 1/30>> COVID/influenza/RSV PCR: Negative 1/30>> blood culture: No growth 1/30>> urine culture: Enterobacter   Procedures: None   Consults: None  Brief Hospital Course: Acute metabolic encephalopathy Likely secondary to PNA/UTI Improved-awake/alert-back to baseline   Probable aspiration pneumonia-left lobar PNA Suspect may have aspirated when she was encephalopathic Appreciate SLP eval-continue dysphagia 3 diet which she seems to be tolerating without any  issues Improved with empiric antibiotics-transition to Augmentin for a few days. May have a small effusion-based on imaging-she is clinically improved-once she completes a course of antibiotics-she will require reimaging.   Probable asymptomatic bacteriuria No symptoms of UTI-UTI ruled out. She has improved with IV Rocephin-urine culture positive for Enterobacter cloacae-no indication to treat at this point.   Hypokalemia/hypomagnesemia Repleted   HTN BP relatively stable Continue amlodipine/irbesartan   HLD Statin   DM-2 (A1c 6.5 on 1/30) CBG stable Continue 10 units of Semglee twice daily+ SSI Not a candidate for tight glycemic control-allow some amount of permissive hyperglycemia   Discharge Diagnoses:  Principal Problem:   CAP (community acquired pneumonia) Active Problems:   Pneumonia   Discharge Instructions:  Activity:  As tolerated with Full fall precautions use walker/cane & assistance as needed   Discharge Instructions     Call MD for:  difficulty breathing, headache or visual disturbances   Complete by: As directed    Diet - low sodium heart healthy   Complete by: As directed    Diet Carb Modified   Complete by: As directed    Discharge instructions   Complete by: As directed    Follow with Primary MD  [facility] in 1-2 weeks  Please get a complete blood count and chemistry panel checked by your Primary MD at your next visit, and again as instructed by your Primary MD.  Get Medicines reviewed and adjusted: Please take all your medications with you for your next visit with your Primary MD  Laboratory/radiological data: Please request your Primary MD to go  over all hospital tests and procedure/radiological results at the follow up, please ask your Primary MD to get all Hospital records sent to his/her office.  In some cases, they will be blood work, cultures and biopsy results pending at the time of your discharge. Please request that  your primary care M.D. follows up on these results.  Also Note the following: If you experience worsening of your admission symptoms, develop shortness of breath, life threatening emergency, suicidal or homicidal thoughts you must seek medical attention immediately by calling 911 or calling your MD immediately  if symptoms less severe.  You must read complete instructions/literature along with all the possible adverse reactions/side effects for all the Medicines you take and that have been prescribed to you. Take any new Medicines after you have completely understood and accpet all the possible adverse reactions/side effects.   Do not drive when taking Pain medications or sleeping medications (Benzodaizepines)  Do not take more than prescribed Pain, Sleep and Anxiety Medications. It is not advisable to combine anxiety,sleep and pain medications without talking with your primary care practitioner  Special Instructions: If you have smoked or chewed Tobacco  in the last 2 yrs please stop smoking, stop any regular Alcohol  and or any Recreational drug use.  Wear Seat belts while driving.  Please note: You were cared for by a hospitalist during your hospital stay. Once you are discharged, your primary care physician will handle any further medical issues. Please note that NO REFILLS for any discharge medications will be authorized once you are discharged, as it is imperative that you return to your primary care physician (or establish a relationship with a primary care physician if you do not have one) for your post hospital discharge needs so that they can reassess your need for medications and monitor your lab values.   CBGs before units and at bedtime.   Increase activity slowly   Complete by: As directed       Allergies as of 03/06/2023       Reactions   Ezetimibe-simvastatin Other (See Comments)   REACTION: unsure true allergy        Medication List     STOP taking these medications     oseltamivir 75 MG capsule Commonly known as: TAMIFLU       TAKE these medications    acetaminophen 500 MG tablet Commonly known as: TYLENOL Take 500 mg by mouth 2 (two) times daily as needed for mild pain (pain score 1-3).   amoxicillin-clavulanate 500-125 MG tablet Commonly known as: Augmentin Take 1 tablet by mouth 3 (three) times daily for 2 days.   aspirin EC 81 MG tablet Take 81 mg by mouth daily.   bisacodyl 5 MG EC tablet Commonly known as: DULCOLAX Take 10 mg by mouth 2 (two) times daily as needed for moderate constipation.   famotidine 20 MG tablet Commonly known as: PEPCID Take 20 mg by mouth every evening.   fluticasone 50 MCG/ACT nasal spray Commonly known as: FLONASE Place 1 spray into both nostrils 2 (two) times daily as needed for allergies.   Lantus SoloStar 100 UNIT/ML Solostar Pen Generic drug: insulin glargine Inject 10 Units into the skin 2 (two) times daily. What changed:  how much to take when to take this Another medication with the same name was removed. Continue taking this medication, and follow the directions you see here.   Linzess 72 MCG capsule Generic drug: linaclotide Take 72 mcg by mouth every morning.  magnesium hydroxide 400 MG/5ML suspension Commonly known as: MILK OF MAGNESIA Take 30 mLs by mouth daily as needed for mild constipation.   metFORMIN 500 MG tablet Commonly known as: GLUCOPHAGE Take 500 mg by mouth 2 (two) times daily.   NovoLOG FlexPen 100 UNIT/ML FlexPen Generic drug: insulin aspart 0-9 Units, Subcutaneous, 3 times daily with meals CBG < 70: Implement Hypoglycemia measures CBG 70 - 120: 0 units CBG 121 - 150: 1 unit CBG 151 - 200: 2 units CBG 201 - 250: 3 units CBG 251 - 300: 5 units CBG 301 - 350: 7 units CBG 351 - 400: 9 units CBG > 400: call MD   nystatin cream Commonly known as: MYCOSTATIN Apply 1 Application topically 2 (two) times daily.   Olmesartan-amLODIPine-HCTZ 40-5-12.5 MG Tabs Take 1  tablet by mouth in the morning.   omeprazole 20 MG capsule Commonly known as: PRILOSEC Take 20 mg by mouth 2 (two) times daily.   ondansetron 4 MG tablet Commonly known as: ZOFRAN Take 4 mg by mouth daily as needed for nausea.   potassium chloride 10 MEQ tablet Commonly known as: KLOR-CON Take 1 tablet (10 mEq total) by mouth 2 (two) times daily for 3 days.   rosuvastatin 10 MG tablet Commonly known as: CRESTOR   sertraline 100 MG tablet Commonly known as: ZOLOFT        Follow-up Information     [facility]. Schedule an appointment as soon as possible for a visit in 1 week(s).   Specialty: Geriatric Medicine Contact information: [address] [place] [address] [phone]                Allergies  Allergen Reactions   Ezetimibe-Simvastatin Other (See Comments)    REACTION: unsure true allergy     Other Procedures/Studies: CT Head Wo Contrast Result Date: 03/02/2023 CLINICAL DATA:  Blunt trauma.  Fall. EXAM: CT HEAD WITHOUT CONTRAST TECHNIQUE: Contiguous axial images were obtained from the base of the skull through the vertex without intravenous contrast. RADIATION DOSE REDUCTION: This exam was performed according to the departmental dose-optimization program which includes automated exposure control, adjustment of the mA and/or kV according to patient size and/or use of iterative reconstruction technique. COMPARISON:  07/16/2022 FINDINGS: Brain: No evidence of acute infarction, hemorrhage, hydrocephalus, extra-axial collection or mass lesion/mass effect. There is mild diffuse low-attenuation within the subcortical and periventricular white matter compatible with chronic microvascular disease. Remote lacunar infarct noted in the left cerebellar hemisphere, unchanged. Vascular: No hyperdense vessel or unexpected calcification. Skull: Normal. Negative for fracture or focal lesion. Sinuses/Orbits: No acute finding. Other: None  IMPRESSION: 1. No acute intracranial abnormalities. 2. Chronic microvascular disease and remote lacunar infarct in the left cerebellar hemisphere. Electronically Signed   By: Signa Kell M.D.   On: 03/02/2023 08:38   CT Cervical Spine Wo Contrast Result Date: 03/02/2023 CLINICAL DATA:  Poly trauma, blunt.  Fall. EXAM: CT CERVICAL SPINE WITHOUT CONTRAST TECHNIQUE: Multidetector CT imaging of the cervical spine was performed without intravenous contrast. Multiplanar CT image reconstructions were also generated. RADIATION DOSE REDUCTION: This exam was performed according to the departmental dose-optimization program which includes automated exposure control, adjustment of the mA and/or kV according to patient size and/or use of iterative reconstruction technique. COMPARISON:  CT cervical spine 12/09/2021 FINDINGS: Alignment: Straightening without focal angulation or listhesis. Skull base and vertebrae: No evidence of acute fracture or traumatic subluxation. Multilevel spondylosis with interbody ankylosis at C3-4. The facet joints appear ankylosed  on the right from C2 through C4 and on the left at C3-4. Soft tissues and spinal canal: No prevertebral fluid or swelling. No visible canal hematoma. Disc levels: Stable multilevel spondylosis with disc space narrowing, endplate osteophytes and facet hypertrophy. Chronic spinal stenosis and foraminal narrowing at multiple levels, similar to previous CT. No large disc herniation identified. Upper chest: No acute findings. Other: Bilateral carotid atherosclerosis. IMPRESSION: 1. No evidence of acute cervical spine fracture, traumatic subluxation or static signs of instability. 2. Stable multilevel cervical spondylosis with chronic spinal stenosis and foraminal narrowing at multiple levels. 3. Carotid atherosclerosis. Electronically Signed   By: Carey Bullocks M.D.   On: 03/02/2023 08:33   DG Chest 2 View Result Date: 03/02/2023 CLINICAL DATA:  83 year old female with  history of fever and altered mental status. EXAM: CHEST - 2 VIEW COMPARISON:  Chest x-ray 07/05/2022. FINDINGS: Poorly defined opacity in the left lower lobe concerning for developing pneumonia. Trace left pleural effusion. Right lung is clear. No right pleural effusion. No pneumothorax. No evidence of pulmonary edema. Heart size is normal. Upper mediastinal contours are within normal limits. Atherosclerotic calcifications in the thoracic aorta. IMPRESSION: 1. Left lower lobe pneumonia with small left parapneumonic pleural effusion. Followup PA and lateral chest X-ray is recommended in 3-4 weeks following trial of antibiotic therapy to ensure resolution and exclude underlying malignancy. Electronically Signed   By: Trudie Reed M.D.   On: 03/02/2023 07:07     TODAY-DAY OF DISCHARGE:  Subjective:   [name] today has no headache,no chest abdominal pain,no new weakness tingling or numbness, feels much better wants to go home today.   Objective:   Blood pressure (!) 156/86, pulse 70, temperature 98.9 F (37.2 C), temperature source Oral, resp. rate 17, height 5\' 4"  (1.626 m), weight 72.9 kg, SpO2 93%. No intake or output data in the 24 hours ending 03/06/23 0851 Filed Weights   03/03/23 1349  Weight: 72.9 kg    Exam: Awake Alert, Oriented *3, No new F.N deficits, Normal affect [place].AT,PERRAL Supple Neck,No JVD, No cervical lymphadenopathy appriciated.  Symmetrical Chest wall movement, Good air movement bilaterally, CTAB RRR,No Gallops,Rubs or new Murmurs, No Parasternal Heave +ve B.Sounds, Abd Soft, Non tender, No organomegaly appriciated, No rebound -guarding or rigidity. No Cyanosis, Clubbing or edema, No new Rash or bruise   PERTINENT RADIOLOGIC STUDIES: No results found.   PERTINENT LAB RESULTS: CBC: Recent Labs    03/04/23 0600  WBC 9.0  HGB 10.6*  HCT 32.6*  PLT 171   CMET CMP     Component Value Date/Time   NA 138 03/04/2023 0655   K 4.1 03/04/2023 0655    CL 102 03/04/2023 0655   CO2 24 03/04/2023 0655   GLUCOSE 189 (H) 03/04/2023 0655   GLUCOSE 101 (H) 12/12/2005 1152   BUN 12 03/04/2023 0655   CREATININE 0.95 03/04/2023 0655   CALCIUM 8.6 (L) 03/04/2023 0655   PROT 6.7 03/04/2023 0655   ALBUMIN 3.1 (L) 03/04/2023 0655   AST 42 (H) 03/04/2023 0655   ALT 43 03/04/2023 0655   ALKPHOS 51 03/04/2023 0655   BILITOT 0.8 03/04/2023 0655   GFR 76.53 04/30/2010 0911   GFRNONAA 59 (L) 03/04/2023 0655    GFR Estimated Creatinine Clearance: 43.9 mL/min (by C-G formula based on SCr of 0.95 mg/dL). No results for input(s): "LIPASE", "AMYLASE" in the last 72 hours. No results for input(s): "CKTOTAL", "CKMB", "CKMBINDEX", "TROPONINI" in the last 72 hours. Invalid input(s): "POCBNP" No results for input(s): "  DDIMER" in the last 72 hours. No results for input(s): "HGBA1C" in the last 72 hours. No results for input(s): "CHOL", "HDL", "LDLCALC", "TRIG", "CHOLHDL", "LDLDIRECT" in the last 72 hours. No results for input(s): "TSH", "T4TOTAL", "T3FREE", "THYROIDAB" in the last 72 hours.  Invalid input(s): "FREET3" No results for input(s): "VITAMINB12", "FOLATE", "FERRITIN", "TIBC", "IRON", "RETICCTPCT" in the last 72 hours. Coags: No results for input(s): "INR" in the last 72 hours.  Invalid input(s): "PT" Microbiology: Recent Results (from the past 240 hours)  Resp panel by RT-PCR (RSV, Flu A&B, Covid) Anterior Nasal Swab     Status: None   Collection Time: 03/02/23  6:23 AM   Specimen: Anterior Nasal Swab  Result Value Ref Range Status   SARS Coronavirus 2 by RT PCR NEGATIVE NEGATIVE Final   Influenza A by PCR NEGATIVE NEGATIVE Final   Influenza B by PCR NEGATIVE NEGATIVE Final    Comment: (NOTE) The Xpert Xpress SARS-CoV-2/FLU/RSV plus assay is intended as an aid in the diagnosis of influenza from Nasopharyngeal swab specimens and should not be used as a sole basis for treatment. Nasal washings and aspirates are unacceptable for Xpert  Xpress SARS-CoV-2/FLU/RSV testing.  Fact Sheet for Patients: BloggerCourse.com  Fact Sheet for Healthcare Providers: SeriousBroker.it  This test is not yet approved or cleared by the [place] FDA and has been authorized for detection and/or diagnosis of SARS-CoV-2 by FDA under an Emergency Use Authorization (EUA). This EUA will remain in effect (meaning this test can be used) for the duration of the COVID-19 declaration under Section 564(b)(1) of the Act, 21 U.S.C. section 360bbb-3(b)(1), unless the authorization is terminated or revoked.     Resp Syncytial Virus by PCR NEGATIVE NEGATIVE Final    Comment: (NOTE) Fact Sheet for Patients: BloggerCourse.com  Fact Sheet for Healthcare Providers: SeriousBroker.it  This test is not yet approved or cleared by the [place] FDA and has been authorized for detection and/or diagnosis of SARS-CoV-2 by FDA under an Emergency Use Authorization (EUA). This EUA will remain in effect (meaning this test can be used) for the duration of the COVID-19 declaration under Section 564(b)(1) of the Act, 21 U.S.C. section 360bbb-3(b)(1), unless the authorization is terminated or revoked.  Performed at [facility], [address] [place], [place] [address]   Urine Culture     Status: Abnormal   Collection Time: 03/02/23  9:27 AM   Specimen: Urine, Random  Result Value Ref Range Status   Specimen Description URINE, RANDOM  Final   Special Requests   Final    NONE Reflexed from [id number] Performed at [facility], [address] [place], [place] [address]    Culture >=100,000 COLONIES/mL ENTEROBACTER CLOACAE (A)  Final   Report Status 03/04/2023 FINAL  Final   Organism ID, Bacteria ENTEROBACTER CLOACAE (A)  Final      Susceptibility   Enterobacter cloacae - MIC*    CEFEPIME 1 SENSITIVE Sensitive     CIPROFLOXACIN <=0.25  SENSITIVE Sensitive     GENTAMICIN <=1 SENSITIVE Sensitive     IMIPENEM 0.5 SENSITIVE Sensitive     NITROFURANTOIN 32 SENSITIVE Sensitive     TRIMETH/SULFA <=20 SENSITIVE Sensitive     PIP/TAZO >=128 RESISTANT Resistant ug/mL    * >=100,000 COLONIES/mL ENTEROBACTER CLOACAE  Culture, blood (Routine X 2) w Reflex to ID Panel     Status: None (Preliminary result)   Collection Time: 03/02/23  9:39 PM   Specimen: BLOOD RIGHT HAND  Result Value Ref  Range Status   Specimen Description BLOOD RIGHT HAND  Final   Special Requests   Final    BOTTLES DRAWN AEROBIC AND ANAEROBIC Blood Culture adequate volume   Culture   Final    NO GROWTH 4 DAYS Performed at [facility], [address] [place], [place] [address]    Report Status PENDING  Incomplete  Culture, blood (Routine X 2) w Reflex to ID Panel     Status: None (Preliminary result)   Collection Time: 03/02/23  9:40 PM   Specimen: BLOOD LEFT HAND  Result Value Ref Range Status   Specimen Description BLOOD LEFT HAND  Final   Special Requests   Final    BOTTLES DRAWN AEROBIC AND ANAEROBIC Blood Culture results may not be optimal due to an inadequate volume of blood received in culture bottles   Culture   Final    NO GROWTH 4 DAYS Performed at [facility], [address] [place], [place] [address]    Report Status PENDING  Incomplete    FURTHER DISCHARGE INSTRUCTIONS:  Get Medicines reviewed and adjusted: Please take all your medications with you for your next visit with your Primary MD  Laboratory/radiological data: Please request your Primary MD to go over all hospital tests and procedure/radiological results at the follow up, please ask your Primary MD to get all Hospital records sent to his/her office.  In some cases, they will be blood work, cultures and biopsy results pending at the time of your discharge. Please request that your primary care M.D. goes through all the records of your hospital data and follows up on  these results.  Also Note the following: If you experience worsening of your admission symptoms, develop shortness of breath, life threatening emergency, suicidal or homicidal thoughts you must seek medical attention immediately by calling 911 or calling your MD immediately  if symptoms less severe.  You must read complete instructions/literature along with all the possible adverse reactions/side effects for all the Medicines you take and that have been prescribed to you. Take any new Medicines after you have completely understood and accpet all the possible adverse reactions/side effects.   Do not drive when taking Pain medications or sleeping medications (Benzodaizepines)  Do not take more than prescribed Pain, Sleep and Anxiety Medications. It is not advisable to combine anxiety,sleep and pain medications without talking with your primary care practitioner  Special Instructions: If you have smoked or chewed Tobacco  in the last 2 yrs please stop smoking, stop any regular Alcohol  and or any Recreational drug use.  Wear Seat belts while driving.  Please note: You were cared for by a hospitalist during your hospital stay. Once you are discharged, your primary care physician will handle any further medical issues. Please note that NO REFILLS for any discharge medications will be authorized once you are discharged, as it is imperative that you return to your primary care physician (or establish a relationship with a primary care physician if you do not have one) for your post hospital discharge needs so that they can reassess your need for medications and monitor your lab values.  Total Time spent coordinating discharge including counseling, education and face to face time equals greater than 30 minutes.  SignedJeoffrey Massed 03/06/2023 8:51 AM

## 2023-03-06 NOTE — Plan of Care (Signed)
  Problem: Education: Goal: Ability to describe self-care measures that may prevent or decrease complications (Diabetes Survival Skills Education) will improve Outcome: Progressing   Problem: Coping: Goal: Ability to adjust to condition or change in health will improve Outcome: Progressing   Problem: Fluid Volume: Goal: Ability to maintain a balanced intake and output will improve Outcome: Progressing   Problem: Metabolic: Goal: Ability to maintain appropriate glucose levels will improve Outcome: Progressing   Problem: Skin Integrity: Goal: Risk for impaired skin integrity will decrease Outcome: Progressing   Problem: Tissue Perfusion: Goal: Adequacy of tissue perfusion will improve Outcome: Progressing   Problem: Clinical Measurements: Goal: Ability to maintain clinical measurements within normal limits will improve Outcome: Progressing

## 2023-03-07 LAB — CULTURE, BLOOD (ROUTINE X 2)
Culture: NO GROWTH
Culture: NO GROWTH
Special Requests: ADEQUATE
